# Patient Record
Sex: Female | Born: 1955
Health system: Southern US, Community
[De-identification: ages and names within clinical notes are randomized; demographics above are authoritative.]

## PROBLEM LIST (undated history)

## (undated) DIAGNOSIS — K7460 Unspecified cirrhosis of liver: Secondary | ICD-10-CM

## (undated) DIAGNOSIS — I10 Essential (primary) hypertension: Secondary | ICD-10-CM

## (undated) DIAGNOSIS — K754 Autoimmune hepatitis: Secondary | ICD-10-CM

## (undated) DIAGNOSIS — I82409 Acute embolism and thrombosis of unspecified deep veins of unspecified lower extremity: Secondary | ICD-10-CM

## (undated) DIAGNOSIS — K8020 Calculus of gallbladder without cholecystitis without obstruction: Secondary | ICD-10-CM

## (undated) DIAGNOSIS — R7989 Other specified abnormal findings of blood chemistry: Secondary | ICD-10-CM

## (undated) DIAGNOSIS — S329XXA Fracture of unspecified parts of lumbosacral spine and pelvis, initial encounter for closed fracture: Secondary | ICD-10-CM

## (undated) DIAGNOSIS — D132 Benign neoplasm of duodenum: Secondary | ICD-10-CM

## (undated) DIAGNOSIS — Z889 Allergy status to unspecified drugs, medicaments and biological substances status: Secondary | ICD-10-CM

## (undated) DIAGNOSIS — K449 Diaphragmatic hernia without obstruction or gangrene: Secondary | ICD-10-CM

## (undated) HISTORY — DX: Fracture of unspecified parts of lumbosacral spine and pelvis, initial encounter for closed fracture: S32.9XXA

## (undated) HISTORY — DX: Unspecified cirrhosis of liver: K74.60

## (undated) HISTORY — DX: Other specified abnormal findings of blood chemistry: R79.89

## (undated) HISTORY — DX: Calculus of gallbladder without cholecystitis without obstruction: K80.20

## (undated) HISTORY — DX: Diaphragmatic hernia without obstruction or gangrene: K44.9

## (undated) HISTORY — DX: Autoimmune hepatitis: K75.4

## (undated) HISTORY — PX: ABDOMINAL HYSTERECTOMY: SHX81

---

## 2001-09-12 ENCOUNTER — Emergency Department (HOSPITAL_COMMUNITY): Admission: EM | Admit: 2001-09-12 | Discharge: 2001-09-12 | Payer: Self-pay | Admitting: Emergency Medicine

## 2001-11-10 ENCOUNTER — Emergency Department (HOSPITAL_COMMUNITY): Admission: EM | Admit: 2001-11-10 | Discharge: 2001-11-10 | Payer: Self-pay | Admitting: Emergency Medicine

## 2001-12-20 ENCOUNTER — Emergency Department (HOSPITAL_COMMUNITY): Admission: EM | Admit: 2001-12-20 | Discharge: 2001-12-20 | Payer: Self-pay | Admitting: Emergency Medicine

## 2010-01-30 LAB — HM PAP SMEAR: HM Pap smear: NORMAL

## 2010-07-31 ENCOUNTER — Ambulatory Visit (INDEPENDENT_AMBULATORY_CARE_PROVIDER_SITE_OTHER): Payer: 59 | Admitting: Internal Medicine

## 2010-07-31 ENCOUNTER — Encounter: Payer: Self-pay | Admitting: Internal Medicine

## 2010-07-31 DIAGNOSIS — F17200 Nicotine dependence, unspecified, uncomplicated: Secondary | ICD-10-CM

## 2010-07-31 DIAGNOSIS — Z23 Encounter for immunization: Secondary | ICD-10-CM

## 2010-07-31 DIAGNOSIS — R9431 Abnormal electrocardiogram [ECG] [EKG]: Secondary | ICD-10-CM

## 2010-07-31 DIAGNOSIS — I10 Essential (primary) hypertension: Secondary | ICD-10-CM

## 2010-08-01 ENCOUNTER — Other Ambulatory Visit: Payer: Self-pay | Admitting: Internal Medicine

## 2010-08-01 ENCOUNTER — Encounter (INDEPENDENT_AMBULATORY_CARE_PROVIDER_SITE_OTHER): Payer: Self-pay | Admitting: *Deleted

## 2010-08-01 DIAGNOSIS — Z1231 Encounter for screening mammogram for malignant neoplasm of breast: Secondary | ICD-10-CM

## 2010-08-06 NOTE — Assessment & Plan Note (Signed)
Summary: NEW [employer] PT--#---PKG---STC   Vital Signs:  Patient profile:   55 year old female Menstrual status:  hysterectomy Height:      66.25 inches (168.28 cm) Weight:      231.25 pounds (105.11 kg) BMI:     37.18 O2 Sat:      99 % on Room air Temp:     98.3 degrees F (36.83 degrees C) oral Pulse rate:   81 / minute Pulse rhythm:   regular Resp:     16 per minute BP sitting:   160 / 96  (left arm) Cuff size:   large  Vitals Entered By: Mackey Birchwood CMA(AAMA) (July 31, 2010 4:23 PM)  Nutrition Counseling: Patient's BMI is greater than 25 and therefore counseled on weight management options.  O2 Flow:  Room air CC: Pt sent by [facility] for High Blood Pressure Evaluation & Management/sls, cma, Preventive Care Is Patient Diabetic? No Pain Assessment Patient in pain? no      Comments Left Arm: 160/96 Right Arm: 162/88 Pt has never used Rx for hypertension/sls,cma     Menstrual Status hysterectomy   Primary Care Provider:  Ronnald Ramp  CC:  Pt sent by [facility] for High Blood Pressure Evaluation & Management/sls, cma, and Preventive Care.  History of Present Illness:  Hypertension Follow-Up      This is a 55 year old woman who presents for Hypertension follow-up.  The patient denies lightheadedness, urinary frequency, headaches, edema, rash, and fatigue.  The patient denies the following associated symptoms: chest pain, chest pressure, exercise intolerance, dyspnea, palpitations, syncope, leg edema, and pedal edema.  The patient reports that dietary compliance has been poor.  The patient reports no exercise.  Adjunctive measures currently used by the patient include salt restriction and relaxation.  She tells me that she had extensive labs done by her employer and that her labs were normal.  Preventive Screening-Counseling & Management  Alcohol-Tobacco     Alcohol drinks/day: 4     Alcohol type: beer     >5/day in last 3 mos: no     Alcohol Counseling: to decrease amount  and/or frequency of alcohol intake     Feels need to cut down: no     Feels annoyed by complaints: no     Feels guilty re: drinking: no     Needs 'eye opener' in am: no     Smoking Status: current     Smoking Cessation Counseling: yes     Smoke Cessation Stage: precontemplative     Packs/Day: <0.25     Year Started: 1980     Pack years: 5     Tobacco Counseling: to quit use of tobacco products  Caffeine-Diet-Exercise     Does Patient Exercise: no  Hep-HIV-STD-Contraception     Hepatitis Risk: no risk noted     HIV Risk: no risk noted     STD Risk: no risk noted      Sexual History:  currently monogamous.        Drug Use:  no.        Blood Transfusions:  no.    Medications Prior to Update: 1)  None  Current Medications (verified): 1)  Claritin 10 Mg Tabs (Loratadine) .... Take 1 Once Daily As Needed 2)  Ibuprofen .... Take As Needed 3)  Aspirin .... Take As Needed 4)  Azor 10-40 Mg Tabs (Amlodipine-Olmesartan) .... One By Mouth Once Daily For High Blood Pressure  Allergies (verified): No Known Drug Allergies  Past History:  Past Medical History: Hypertension  Past Surgical History: Hysterectomy  Family History: Family History of Alcoholism/Addiction Family History Hypertension Family History Thyroid disease  Social History: Occupation: Development worker, international aid at [employer] Single Current Smoker Alcohol use-yes Drug use-no Regular exercise-no Smoking Status:  current Packs/Day:  <0.25 Hepatitis Risk:  no risk noted HIV Risk:  no risk noted STD Risk:  no risk noted Sexual History:  currently monogamous Blood Transfusions:  no Drug Use:  no Does Patient Exercise:  no  Review of Systems       The patient complains of weight gain.  The patient denies anorexia, fever, weight loss, chest pain, syncope, dyspnea on exertion, peripheral edema, prolonged cough, headaches, hemoptysis, abdominal pain, hematuria, suspicious skin lesions, difficulty walking, depression,  and angioedema.    Physical Exam  General:  alert, well-developed, well-nourished, well-hydrated, appropriate dress, normal appearance, healthy-appearing, cooperative to examination, good hygiene, and overweight-appearing.   Head:  normocephalic, atraumatic, no abnormalities observed, and no abnormalities palpated.   Eyes:  vision grossly intact, pupils equal, pupils round, and pupils reactive to light.   Ears:  R ear normal and L ear normal.   Mouth:  Oral mucosa and oropharynx without lesions or exudates.  Teeth in good repair. Neck:  supple, full ROM, no masses, no thyromegaly, no thyroid nodules or tenderness, no JVD, no HJR, normal carotid upstroke, no carotid bruits, and no cervical lymphadenopathy.   Lungs:  normal respiratory effort, no intercostal retractions, no accessory muscle use, normal breath sounds, no dullness, no fremitus, no crackles, and no wheezes.   Heart:  normal rate, regular rhythm, no murmur, no gallop, no rub, and no JVD.   Abdomen:  soft, non-tender, normal bowel sounds, no distention, no masses, no guarding, no rigidity, no rebound tenderness, no abdominal hernia, no inguinal hernia, no hepatomegaly, and no splenomegaly.   Msk:  No deformity or scoliosis noted of thoracic or lumbar spine.   Pulses:  R and L carotid,radial,femoral,dorsalis pedis and posterior tibial pulses are full and equal bilaterally Extremities:  No clubbing, cyanosis, edema, or deformity noted with normal full range of motion of all joints.   Neurologic:  No cranial nerve deficits noted. Station and gait are normal. Plantar reflexes are down-going bilaterally. DTRs are symmetrical throughout. Sensory, motor and coordinative functions appear intact. Skin:  Intact without suspicious lesions or rashes Cervical Nodes:  No lymphadenopathy noted Psych:  Cognition and judgment appear intact. Alert and cooperative with normal attention span and concentration. No apparent delusions, illusions,  hallucinations Additional Exam:  EKG shows poor RWP in anterior leads and possible Q waves.   Impression & Recommendations:  Problem # 1:  HYPERTENSION (ICD-401.9) Assessment New  Her updated medication list for this problem includes:    Azor 10-40 Mg Tabs (Amlodipine-olmesartan) ..... One by mouth once daily for high blood pressure  BP today: 160/96  Orders: EKG w/ Interpretation (93000)  Problem # 2:  TOBACCO USE (ICD-305.1) Assessment: New  Encouraged smoking cessation and discussed different methods for smoking cessation.   Orders: EKG w/ Interpretation (93000)  Problem # 3:  ABNORMAL ELECTROCARDIOGRAM (ICD-794.31) Assessment: New  Orders: Cardiolite (Cardiolite) EKG w/ Interpretation (93000)  Complete Medication List: 1)  Claritin 10 Mg Tabs (Loratadine) .... Take 1 once daily as needed 2)  Ibuprofen  .... Take as needed 3)  Aspirin  .... Take as needed 4)  Azor 10-40 Mg Tabs (Amlodipine-olmesartan) .... One by mouth once daily for high blood pressure  Other Orders: Radiology Referral (  Radiology) Tdap => 66yr IM ((08144 Admin 1st Vaccine ([phone]  Colorectal Screening:  Current Recommendations:    Colonoscopy recommended: patient defers today but will consider in the future  PAP Screening:    Reviewed PAP smear recommendations:  patient refuses understanding risks of delayed diagnosis  Mammogram Screening:    Reviewed Mammogram recommendations:  mammogram ordered  Osteoporosis Risk Assessment:  Risk Factors for Fracture or Low Bone Density:   Smoking status:       current  Immunization & Chemoprophylaxis:    Tetanus vaccine: Tdap  (07/31/2010)  Patient Instructions: 1)  Please schedule a follow-up appointment in 1 month. 2)  Tobacco is very bad for your health and your loved ones! You Should stop smoking!. 3)  Stop Smoking Tips: Choose a Quit date. Cut down before the Quit date. decide what you will do as a substitute when you feel the urge to  smoke(gum,toothpick,exercise). 4)  It is important that you exercise regularly at least 20 minutes 5 times a week. If you develop chest pain, have severe difficulty breathing, or feel very tired , stop exercising immediately and seek medical attention. 5)  You need to lose weight. Consider a lower calorie diet and regular exercise.  6)  It is not healthy  for men to drink more than 2-3 drinks per day or for women to drink more than 1-2 drinks per day. 7)  Schedule your mammogram. 8)  Schedule a colonoscopy/sigmoidoscopy to help detect colon cancer. 9)  You need to have a Pap Smear to prevent cervical cancer. 10)  Take an Aspirin every day. 11)  Check your Blood Pressure regularly. If it is above 140/90: you should make an appointment. Prescriptions: AZOR 10-40 MG TABS (AMLODIPINE-OLMESARTAN) One by mouth once daily for high blood pressure  #140 x 0   Entered and Authorized by:   TJanith LimaMD   Signed by:   TJanith LimaMD on 07/31/2010   Method used:   Samples Given   RxID:   [id number]   Orders Added: 1)  Radiology Referral [Radiology] 2)  Cardiolite [Cardiolite] 3)  Tdap => 79yrIM [9[27741])  Admin 1st Vaccine [9[28786] 7 New Patient Level IV [9[67209])  EKG w/ Interpretation [93000]   Immunizations Administered:  Tetanus Vaccine:    Vaccine Type: Tdap    Site: right deltoid    Mfr: GlaxoSmithKline    Dose: 0.5 ml    Route: IM    Given by: LaEstell HarpinMA    Exp. Date: 03/28/2012    Lot #: [id number]  VIS given: 04/26/08 version given July 31, 2010.   Immunizations Administered:  Tetanus Vaccine:    Vaccine Type: Tdap    Site: right deltoid    Mfr: GlaxoSmithKline    Dose: 0.5 ml    Route: IM    Given by: LaEstell HarpinMA    Exp. Date: 03/28/2012    Lot #: [id number]  VIS given: 04/26/08 version given July 31, 2010.

## 2010-08-06 NOTE — Letter (Signed)
Summary: Primary Care Consult Scheduled Letter  [facility]   [place], [place] [address]   Phone: [phone]  Fax: [phone]      08/01/2010 MRN: [id number]  [facility] [address] [place], [place]  [address]  [place]    Dear Ms. Milleson,      We have scheduled an appointment for you.  At the recommendation of Dr.Thomas Jones, we have scheduled you a MRI Rande Lawman) on March 6,2012 at 9:45 AM .  Their address is [address] .The office phone number is [phone] If this appointment day and time is not convenient for you, please feel free to call the office of the doctor you are being referred to at the number listed above and reschedule the appointment.     It is important for you to keep your scheduled appointments. We are here to make sure you are given good patient care. If you have questions or you have made changes to your appointment, please notify us at  [phone]        , ask for  DEBRA            .   - Thank you,  Patient Care Coordinator [facility]

## 2010-08-12 ENCOUNTER — Telehealth (INDEPENDENT_AMBULATORY_CARE_PROVIDER_SITE_OTHER): Payer: Self-pay | Admitting: Radiology

## 2010-08-13 ENCOUNTER — Encounter (HOSPITAL_COMMUNITY): Payer: 59

## 2010-08-20 NOTE — Progress Notes (Signed)
Summary: Nuclear Pre-Procedure  Phone Note Outgoing Call Call back at Home Phone [phone]   Call placed by: Eliezer Lofts, EMT-P,  August 12, 2010 3:17 PM Call placed to: Patient Reason for Call: Confirm/change Appt Summary of Call: Unable to leave message with information on My[employer] Information Sheet (see scanned document for details). No answer/no machine. Eliezer Lofts, EMT-P  August 12, 2010 3:18 PM      Nuclear Med Background Indications for Stress Test: Evaluation for Ischemia, Abnormal EKG  Indications Comments: new changes Q-Waves       Nuclear Pre-Procedure Cardiac Risk Factors: Hypertension, Smoker Height (in): 66.25

## 2010-08-28 ENCOUNTER — Encounter (HOSPITAL_COMMUNITY): Payer: 59

## 2010-09-04 ENCOUNTER — Encounter (HOSPITAL_COMMUNITY): Payer: 59 | Admitting: Radiology

## 2010-09-12 ENCOUNTER — Ambulatory Visit (HOSPITAL_COMMUNITY): Payer: 59 | Attending: Internal Medicine | Admitting: Radiology

## 2010-09-12 VITALS — Ht 66.0 in | Wt 222.0 lb

## 2010-09-12 DIAGNOSIS — R0602 Shortness of breath: Secondary | ICD-10-CM

## 2010-09-12 DIAGNOSIS — R9431 Abnormal electrocardiogram [ECG] [EKG]: Secondary | ICD-10-CM | POA: Insufficient documentation

## 2010-09-12 MED ORDER — TECHNETIUM TC 99M TETROFOSMIN IV KIT
11.0000 | PACK | Freq: Once | INTRAVENOUS | Status: AC | PRN
  Administered 2010-09-12: 11 via INTRAVENOUS

## 2010-09-12 MED ORDER — REGADENOSON 0.4 MG/5ML IV SOLN
0.4000 mg | Freq: Once | INTRAVENOUS | Status: AC
  Administered 2010-09-12: 0.4 mg via INTRAVENOUS

## 2010-09-12 MED ORDER — TECHNETIUM TC 99M TETROFOSMIN IV KIT
33.0000 | PACK | Freq: Once | INTRAVENOUS | Status: AC | PRN
  Administered 2010-09-12: 33 via INTRAVENOUS

## 2010-09-12 NOTE — Progress Notes (Signed)
[facility] [place] [address] [phone]  Cardiology Nuclear Med Study  Kimberly Pierce is a 55 y.o. female [id number] December 12, 1955   Nuclear Med Background Indication for Stress Test:  Evaluation for Ischemia and Abnormal EKG History:  No prior known history of CAD Cardiac Risk Factors: Family History - CAD, Hypertension and Smoker  Symptoms:  No symptoms   Nuclear Pre-Procedure Caffeine/Decaff Intake:  None NPO After: 7:00pm   Lungs:  Clear IV 0.9% NS with Angio Cath:  20g  IV Site: R Forearm  IV Started by:  Eliezer Lofts, EMT-P  Chest Size (in):  44 Cup Size: D  Height: 5' 6"  (1.676 m)  Weight:  222 lb (100.699 kg)  BMI:  Body mass index is 35.83 kg/(m^2). Tech Comments:  NA    Nuclear Med Study 1 or 2 day study: 1 day  Stress Test Type:  Lexiscan  Reading MD: Jenkins Rouge, MD  Order Authorizing Provider:  Dr. [name], MD  Resting Radionuclide: Technetium 16mTetrofosmin  Resting Radionuclide Dose: 11 mCi   Stress Radionuclide:  Technetium 918metrofosmin  Stress Radionuclide Dose: 33 mCi           Stress Protocol Rest HR: 90 Stress HR: 116  Rest BP: 159/99 Stress BP: 182/81  Exercise Time (min): n/a METS: n/a   Predicted Max HR: 165 bpm % Max HR: 70.3 bpm Rate Pressure Product: 21112   Dose of Adenosine (mg):  n/a Dose of Lexiscan: 0.4 mg  Dose of Atropine (mg): n/a Dose of Dobutamine: n/a mcg/kg/min (at max HR)  Stress Test Technologist: TeIleene HutchinsonEMT-P ; data entered by PaAllen Derryuclear Technologist:   ToAnnye Rusk  Rest Procedure:  Myocardial perfusion imaging was performed at rest 45 minutes following the intravenous administration of Technetium 9949mtrofosmin. Rest ECG: NSR  Stress Procedure: The patient attempted walking treadmill using Bruce Protocol, but the patient unable to comprehend proper technique of walking, changed to lexUpper Mont[place] patient received IV Lexiscan 0.4 mg  over 15-seconds.  Technetium 45m68mrofosmin injected at 30-seconds.  There were no significant changes with Lexiscan, occasional PVC's.  Quantitative spect images were obtained after a 45 minute delay.  Stress ECG: No significant change from baseline ECG  QPS Raw Data Images:  Normal; no motion artifact; normal heart/lung ratio. Stress Images:  Normal homogeneous uptake in all areas of the myocardium. Rest Images:  Normal homogeneous uptake in all areas of the myocardium. Subtraction (SDS):  Normal Transient Ischemic Dilatation (Normal <1.22): .90 Lung/Heart Ratio (Normal <0.45):  .33  Quantitative Gated Spect Images QGS EDV:  118 ml QGS ESV:  94 ml QGS cine images:  NL LV Function; NL Wall Motion QGS EF: 63%  Impression Exercise Capacity:  Lexiscan with no exercise. BP Response:  Normal blood pressure response. Clinical Symptoms:  There is dyspnea. ECG Impression:  No significant ST segment change suggestive of ischemia. Comparison with Prior Nuclear Study: No previous nuclear study performed  Overall Impression:  Normal stress nuclear study.        PeteJenkins Rouge

## 2010-09-16 NOTE — Progress Notes (Signed)
ROUTED TO DR.JONES

## 2011-02-21 ENCOUNTER — Other Ambulatory Visit: Payer: Self-pay

## 2011-02-21 MED ORDER — AMLODIPINE-OLMESARTAN 10-40 MG PO TABS: 1.0000 | ORAL_TABLET | Freq: Every day | ORAL | Status: DC

## 2011-05-02 ENCOUNTER — Emergency Department (HOSPITAL_COMMUNITY)
Admission: EM | Admit: 2011-05-02 | Discharge: 2011-05-03 | Disposition: A | Discharge status: Home / Self Care | Payer: 59 | Attending: Emergency Medicine | Admitting: Emergency Medicine

## 2011-05-02 DIAGNOSIS — Z86718 Personal history of other venous thrombosis and embolism: Secondary | ICD-10-CM | POA: Insufficient documentation

## 2011-05-02 DIAGNOSIS — M545 Low back pain, unspecified: Secondary | ICD-10-CM | POA: Insufficient documentation

## 2011-05-02 DIAGNOSIS — R109 Unspecified abdominal pain: Secondary | ICD-10-CM | POA: Insufficient documentation

## 2011-05-02 DIAGNOSIS — I10 Essential (primary) hypertension: Secondary | ICD-10-CM | POA: Insufficient documentation

## 2011-05-02 DIAGNOSIS — IMO0002 Reserved for concepts with insufficient information to code with codable children: Secondary | ICD-10-CM

## 2011-05-02 DIAGNOSIS — M25559 Pain in unspecified hip: Secondary | ICD-10-CM | POA: Insufficient documentation

## 2011-05-02 DIAGNOSIS — S22009A Unspecified fracture of unspecified thoracic vertebra, initial encounter for closed fracture: Secondary | ICD-10-CM | POA: Insufficient documentation

## 2011-05-02 DIAGNOSIS — Z79899 Other long term (current) drug therapy: Secondary | ICD-10-CM | POA: Insufficient documentation

## 2011-05-02 DIAGNOSIS — X503XXA Overexertion from repetitive movements, initial encounter: Secondary | ICD-10-CM | POA: Insufficient documentation

## 2011-05-02 HISTORY — DX: Acute embolism and thrombosis of unspecified deep veins of unspecified lower extremity: I82.409

## 2011-05-02 HISTORY — DX: Essential (primary) hypertension: I10

## 2011-05-03 ENCOUNTER — Encounter: Payer: Self-pay | Admitting: *Deleted

## 2011-05-03 ENCOUNTER — Emergency Department (HOSPITAL_COMMUNITY): Payer: 59

## 2011-05-03 LAB — POCT I-STAT, CHEM 8
Chloride: 109 mEq/L (ref 96–112)
Creatinine, Ser: 0.3 mg/dL — ABNORMAL LOW (ref 0.50–1.10)
Glucose, Bld: 124 mg/dL — ABNORMAL HIGH (ref 70–99)
HCT: 40 % (ref 36.0–46.0)
Potassium: 3.8 mEq/L (ref 3.5–5.1)
Sodium: 141 mEq/L (ref 135–145)

## 2011-05-03 MED ORDER — IBUPROFEN 800 MG PO TABS: 800.0000 mg | ORAL_TABLET | Freq: Three times a day (TID) | ORAL | Status: AC

## 2011-05-03 MED ORDER — HYDROCODONE-ACETAMINOPHEN 5-500 MG PO TABS: 1.0000 | ORAL_TABLET | Freq: Three times a day (TID) | ORAL | Status: DC | PRN

## 2011-05-03 MED ORDER — KETOROLAC TROMETHAMINE 60 MG/2ML IM SOLN
60.0000 mg | Freq: Once | INTRAMUSCULAR | Status: AC
  Administered 2011-05-03: 60 mg via INTRAMUSCULAR
  Filled 2011-05-03: qty 2

## 2011-05-03 NOTE — ED Notes (Signed)
C/o intermittant back spasms, r/t lifting furniture in [place], comes and goes, worse at night, muscles jumping in my back. Denies other sx. No relief with ibuprofen or aleve.

## 2011-05-03 NOTE — ED Provider Notes (Signed)
History     CSN: [id number] Arrival date & time: 05/02/2011 11:23 PM   First MD Initiated Contact with Patient 05/03/11 0159      Chief Complaint  Patient presents with  . Back Pain    since lifting furniture in OCT, n"feel like spasms".    (Consider location/radiation/quality/duration/timing/severity/associated sxs/prior treatment) Patient is a 55 y.o. female presenting with back pain. The history is provided by the patient.  Back Pain  Episode onset: About 3 weeks ago. The problem occurs daily. The problem has not changed since onset.Associated with: Kimberly Pierce is a started after she was lifting some heavy furniture. The pain is present in the lumbar spine. The quality of the pain is described as shooting. Radiates to: Left hip and flank. The pain is moderate. The symptoms are aggravated by bending and twisting. The pain is worse during the night. Pertinent negatives include no chest pain, no fever, no weight loss, no headaches, no abdominal pain, no abdominal swelling, no bowel incontinence, no perianal numbness, no bladder incontinence, no dysuria, no pelvic pain, no leg pain, no paresthesias, no paresis, no tingling and no weakness. She has tried NSAIDs for the symptoms. The treatment provided mild relief. Risk factors include obesity and lack of exercise.   has been trying over-the-counter medications with minimal relief. Pain is sharp in nature. She notices it mostly at nighttime when she is rolling over. No significant alleviating factors. No incontinence, numbness or tingling or weakness in her lower extremities. No history of back problems in the past. No history of kidney stones. No hematuria.  Past Medical History  Diagnosis Date  . Hypertension   . DVT (deep venous thrombosis)     Past Surgical History  Procedure Date  . Abdominal hysterectomy     Family History  Problem Relation Age of Onset  . Heart failure Father     History  Substance Use Topics  . Smoking status:  Current Some Day Smoker  . Smokeless tobacco: Not on file  . Alcohol Use: Yes     occaisional    OB History    Grav Para Term Preterm Abortions TAB SAB Ect Mult Living                  Review of Systems  Constitutional: Negative for fever, chills and weight loss.  HENT: Negative for neck pain and neck stiffness.   Eyes: Negative for pain.  Respiratory: Negative for shortness of breath.   Cardiovascular: Negative for chest pain.  Gastrointestinal: Negative for vomiting, abdominal pain and bowel incontinence.  Genitourinary: Negative for bladder incontinence, dysuria and pelvic pain.  Musculoskeletal: Positive for back pain. Negative for joint swelling and gait problem.  Skin: Negative for rash.  Neurological: Negative for tingling, weakness, headaches and paresthesias.  All other systems reviewed and are negative.    Allergies  Review of patient's allergies indicates no known allergies.  Home Medications   Current Outpatient Rx  Name Route Sig Dispense Refill  . AMLODIPINE-OLMESARTAN 10-40 MG PO TABS Oral Take 1 tablet by mouth daily.      Marland Kitchen LORATADINE 10 MG PO TABS Oral Take 10 mg by mouth daily as needed.      Marland Kitchen NAPROXEN SODIUM 220 MG PO TABS Oral Take 660 mg by mouth daily as needed. For back pain       BP 218/97  Pulse 87  Temp(Src) 98.3 F (36.8 C) (Oral)  Resp 19  SpO2 97%  Physical Exam  Constitutional: She is  oriented to person, place, and time. She appears well-developed and well-nourished.  HENT:  Head: Normocephalic and atraumatic.  Eyes: Conjunctivae and EOM are normal. Pupils are equal, round, and reactive to light.  Neck: Full passive range of motion without pain. Neck supple. No thyromegaly present.       No midline cervical, thoracic or lumbar tenderness. Localizes discomfort to the left paralumbar and flank area. No reproducible tenderness. No deformity. No rash or lesion. Lower extremity strengths, sensorium to light touch, DTRs are equal and  intact  Cardiovascular: Normal rate, regular rhythm, S1 normal, S2 normal and intact distal pulses.   Pulmonary/Chest: Effort normal and breath sounds normal.  Abdominal: Soft. Bowel sounds are normal. There is no tenderness. There is no CVA tenderness.  Musculoskeletal: Normal range of motion.  Neurological: She is alert and oriented to person, place, and time. She has normal strength and normal reflexes. No cranial nerve deficit or sensory deficit. She displays a negative Romberg sign. GCS eye subscore is 4. GCS verbal subscore is 5. GCS motor subscore is 6.       Normal Gait  Skin: Skin is warm and dry. No rash noted. No cyanosis. Nails show no clubbing.  Psychiatric: She has a normal mood and affect. Her speech is normal and behavior is normal.    ED Course  Procedures (including critical care time)  Labs Reviewed  POCT I-STAT, CHEM 8 - Abnormal; Notable for the following:    Creatinine, Ser 0.30 (*)    Glucose, Bld 124 (*)    All other components within normal limits  URINALYSIS, ROUTINE W REFLEX MICROSCOPIC  I-STAT, CHEM 8   Ct Abdomen Pelvis Wo Contrast  05/03/2011  *RADIOLOGY REPORT*  Clinical Data: Intermittent back spasms and left flank pain.  CT ABDOMEN AND PELVIS WITHOUT CONTRAST  Technique:  Multidetector CT imaging of the abdomen and pelvis was performed following the standard protocol without intravenous contrast.  Comparison: None.  Findings: The visualized lung bases are clear.  The liver and spleen are unremarkable in appearance.  The gallbladder is within normal limits.  The pancreas and adrenal glands are unremarkable.  Nonspecific perinephric stranding is noted bilaterally.  There is mild scarring involving the anterior aspect of the right kidney. The kidneys are otherwise grossly unremarkable in appearance. There is no evidence of hydronephrosis.  No renal or ureteral stones are seen.  No Pierce fluid is identified.  The small bowel is unremarkable in appearance.  The  stomach is within normal limits.  No acute vascular abnormalities are seen.  Scattered calcification is noted along the distal abdominal aorta and its branches.  The appendix is normal in caliber and filled with air, extending into the right hemipelvis, without evidence for appendicitis.  The colon is partially filled with stool and is unremarkable in appearance.  The bladder is mildly distended and within normal limits.  The patient is status post hysterectomy; no suspicious adnexal masses are seen.  No inguinal lymphadenopathy is seen.  No acute osseous abnormalities are identified.  There are mild chronic compression deformities involving the superior endplate of Z61, superior endplate of L2, and inferior endplate of L3, likely reflecting underlying Schmorl's nodes.  Associated sclerotic change is seen.  Mild facet disease is noted at the lower lumbar spine.  IMPRESSION:  1.  No acute abnormalities identified within the abdomen or pelvis. 2.  Scattered calcification along the abdominal aorta and its branches. 3.  Mild chronic compression deformities involving the superior endplate of W96,  superior endplate of L2 and inferior endplate of L3, likely reflecting underlying Schmorl's nodes.  Associated sclerotic change noted.  No definite acute fractures seen.  Original Report Authenticated By: Santa Lighter, M.D.   Blood pressure noted and on further history patient has not been taking her blood pressure medications because she was worried that over-the-counter medications with interactive with them. I gave patient her prescribed dose of azor which improved her blood pressure.  IM Toradol for back pain   MDM   CT scan as above for back pain demonstrates compression deformities, likely the source of her symptoms. Plan NSAIDs for pain and narcotics at nighttime as needed. Plan close primary care followup for further evaluation        Teressa Lower, MD 05/03/11 6394

## 2011-05-03 NOTE — ED Notes (Signed)
REPORT GIVEN AND CARE ENDORSED TO CHRISTA, RN

## 2011-05-05 ENCOUNTER — Other Ambulatory Visit (INDEPENDENT_AMBULATORY_CARE_PROVIDER_SITE_OTHER): Payer: 59

## 2011-05-05 ENCOUNTER — Ambulatory Visit (INDEPENDENT_AMBULATORY_CARE_PROVIDER_SITE_OTHER): Payer: 59 | Admitting: Internal Medicine

## 2011-05-05 ENCOUNTER — Encounter: Payer: Self-pay | Admitting: Internal Medicine

## 2011-05-05 VITALS — BP 138/84 | HR 70 | Temp 97.7°F | Resp 16 | Wt 232.0 lb

## 2011-05-05 DIAGNOSIS — I10 Essential (primary) hypertension: Secondary | ICD-10-CM

## 2011-05-05 DIAGNOSIS — S32009A Unspecified fracture of unspecified lumbar vertebra, initial encounter for closed fracture: Secondary | ICD-10-CM

## 2011-05-05 DIAGNOSIS — S32000A Wedge compression fracture of unspecified lumbar vertebra, initial encounter for closed fracture: Secondary | ICD-10-CM

## 2011-05-05 DIAGNOSIS — M810 Age-related osteoporosis without current pathological fracture: Secondary | ICD-10-CM

## 2011-05-05 DIAGNOSIS — M549 Dorsalgia, unspecified: Secondary | ICD-10-CM | POA: Insufficient documentation

## 2011-05-05 LAB — TSH: TSH: 0.99 u[IU]/mL (ref 0.35–5.50)

## 2011-05-05 LAB — COMPREHENSIVE METABOLIC PANEL
Alkaline Phosphatase: 63 U/L (ref 39–117)
BUN: 13 mg/dL (ref 6–23)
CO2: 27 mEq/L (ref 19–32)
Creatinine, Ser: 0.7 mg/dL (ref 0.4–1.2)
GFR: 119.01 mL/min (ref >60.00)
Glucose, Bld: 98 mg/dL (ref 70–99)
Sodium: 141 mEq/L (ref 135–145)
Total Bilirubin: 0.7 mg/dL (ref 0.3–1.2)
Total Protein: 7.7 g/dL (ref 6.0–8.3)

## 2011-05-05 LAB — CBC WITH DIFFERENTIAL/PLATELET
Eosinophils Relative: 4.7 % (ref 0.0–5.0)
HCT: 37 % (ref 36.0–46.0)
Hemoglobin: 12.5 g/dL (ref 12.0–15.0)
Lymphs Abs: 1.9 10*3/uL (ref 0.7–4.0)
MCV: 96 fl (ref 78.0–100.0)
Monocytes Absolute: 0.5 10*3/uL (ref 0.1–1.0)
Monocytes Relative: 7.8 % (ref 3.0–12.0)
Neutro Abs: 3.2 10*3/uL (ref 1.4–7.7)
Platelets: 197 10*3/uL (ref 150.0–400.0)
WBC: 5.8 10*3/uL (ref 4.5–10.5)

## 2011-05-05 MED ORDER — AMLODIPINE-OLMESARTAN 10-40 MG PO TABS: 1.0000 | ORAL_TABLET | Freq: Every day | ORAL | Status: DC

## 2011-05-05 MED ORDER — RISEDRONATE SODIUM 35 MG PO TBEC: 1.0000 | DELAYED_RELEASE_TABLET | ORAL | Status: DC

## 2011-05-05 MED ORDER — BUPRENORPHINE 10 MCG/HR TD PTWK: 1.0000 | MEDICATED_PATCH | TRANSDERMAL | Status: DC

## 2011-05-05 NOTE — Progress Notes (Signed)
Subjective:    Patient ID: Kimberly Pierce, female    DOB: 1955/10/02, 55 y.o.   MRN: [id number]  Back Pain This is a recurrent problem. Episode onset: for 2 months. The problem occurs constantly. The problem is unchanged. The pain is present in the lumbar spine. The quality of the pain is described as stabbing. The pain radiates to the left thigh. The pain is at a severity of 7/10. The pain is moderate. The pain is worse during the day. The symptoms are aggravated by bending, standing and twisting. Stiffness is present all day. Pertinent negatives include no abdominal pain, bladder incontinence, bowel incontinence, chest pain, dysuria, fever, headaches, leg pain, numbness, paresis, paresthesias, pelvic pain, perianal numbness, tingling, weakness or weight loss. Risk factors include menopause, obesity and lack of exercise. She has tried NSAIDs and analgesics (she has not had much relief with vicodin) for the symptoms. The treatment provided mild relief.  Hypertension This is a chronic problem. The current episode started more than 1 year ago. The problem is unchanged. The problem is uncontrolled. Pertinent negatives include no anxiety, blurred vision, chest pain, headaches, malaise/fatigue, neck pain, orthopnea, palpitations, peripheral edema, PND, shortness of breath or sweats. Risk factors for coronary artery disease include no known risk factors. Past treatments include angiotensin blockers and calcium channel blockers. The current treatment provides moderate improvement. Compliance problems include psychosocial issues, exercise and diet.       Review of Systems  Constitutional: Negative for fever, chills, weight loss, malaise/fatigue, diaphoresis, activity change, appetite change, fatigue and unexpected weight change.  HENT: Negative for facial swelling and neck pain.   Eyes: Negative.  Negative for blurred vision.  Respiratory: Negative for cough, shortness of breath, wheezing and stridor.     Cardiovascular: Negative for chest pain, palpitations, orthopnea and PND.  Gastrointestinal: Negative for nausea, vomiting, abdominal pain, diarrhea, constipation, blood in stool and bowel incontinence.  Genitourinary: Negative for bladder incontinence, dysuria, urgency, frequency, hematuria, decreased urine volume, enuresis, difficulty urinating, pelvic pain and dyspareunia.  Musculoskeletal: Positive for back pain. Negative for myalgias, joint swelling, arthralgias and gait problem.  Skin: Negative for color change, pallor, rash and wound.  Neurological: Negative for dizziness, tingling, tremors, seizures, syncope, facial asymmetry, speech difficulty, weakness, light-headedness, numbness, headaches and paresthesias.  Hematological: Negative for adenopathy. Does not bruise/bleed easily.  Psychiatric/Behavioral: Negative.        Objective:   Physical Exam  Vitals reviewed. Constitutional: She is oriented to person, place, and time. She appears well-developed and well-nourished. No distress.  HENT:  Head: Normocephalic and atraumatic.  Mouth/Throat: Oropharynx is clear and moist. No oropharyngeal exudate.  Eyes: Conjunctivae are normal. Right eye exhibits no discharge. Left eye exhibits no discharge. No scleral icterus.  Neck: Normal range of motion. Neck supple. No JVD present. No tracheal deviation present. No thyromegaly present.  Cardiovascular: Normal rate, regular rhythm, normal heart sounds and intact distal pulses.  Exam reveals no gallop and no friction rub.   No murmur heard. Pulmonary/Chest: Effort normal and breath sounds normal. No stridor. No respiratory distress. She has no wheezes. She has no rales. She exhibits no tenderness.  Abdominal: Soft. Bowel sounds are normal. She exhibits no distension and no mass. There is no tenderness. There is no rebound and no guarding.  Musculoskeletal: Normal range of motion. She exhibits no edema and no tenderness.       Lumbar back:  Normal. She exhibits normal range of motion, no tenderness, no bony tenderness, no swelling, no edema,  no deformity, no laceration, no pain, no spasm and normal pulse.  Lymphadenopathy:    She has no cervical adenopathy.  Neurological: She is alert and oriented to person, place, and time. She has normal strength. She displays no atrophy, no tremor and normal reflexes. No cranial nerve deficit or sensory deficit. She exhibits normal muscle tone. She displays a negative Romberg sign. She displays no seizure activity. Coordination and gait normal. She displays no Babinski's sign on the right side. She displays no Babinski's sign on the left side.  Reflex Scores:      Tricep reflexes are 1+ on the right side and 1+ on the left side.      Bicep reflexes are 1+ on the right side and 1+ on the left side.      Brachioradialis reflexes are 1+ on the right side and 1+ on the left side.      Patellar reflexes are 1+ on the right side and 1+ on the left side.      Achilles reflexes are 1+ on the right side and 1+ on the left side. Skin: Skin is warm and dry. No rash noted. She is not diaphoretic. No erythema. No pallor.  Psychiatric: She has a normal mood and affect. Her behavior is normal. Judgment and thought content normal.     Lab Results  Component Value Date   HGB 13.6 05/03/2011   HCT 40.0 05/03/2011   GLUCOSE 124* 05/03/2011   NA 141 05/03/2011   K 3.8 05/03/2011   CL 109 05/03/2011   CREATININE 0.30* 05/03/2011   BUN 15 05/03/2011       Assessment & Plan:

## 2011-05-05 NOTE — Progress Notes (Signed)
rx called in

## 2011-05-05 NOTE — Patient Instructions (Addendum)
Osteoporosis Osteoporosis is a disease of the bones that makes them weaker and prone to break (fracture). By their mid-30s, most people begin to gradually lose bone strength. If this is severe enough, osteoporosis may occur. Osteopenia is a less severe weakness of the bones, which places you at risk for osteoporosis. It is important to identify if you have osteoporosis or osteopenia. Bone fractures from osteoporosis (especially hip and spine fractures) are a major cause of hospitalization, loss of independence, and can lead to life-threatening complications. CAUSES  There are a number of causes and risk factors:  Gender. Women are at a higher risk for osteoporosis than men.   Age. Bone formation slows down with age.   Ethnicity. For unclear reasons, white and Asian women are at higher risk for osteoporosis. Hispanic and African American women are at increased, but lesser, risk.   Family history of osteoporosis can mean that you are at a higher risk for getting it.   History of bone fractures indicates you may be at higher risk of another.   Calcium is very important for bone health and strength. Not enough calcium in your diet increases your risk for osteoporosis. Vitamin D is important for calcium metabolism. You get vitamin D from sunlight, foods, or supplements.   Physical activity. Bones get stronger with weight-bearing exercise and weaker without use.   Smoking is associated with decreased bone strength.   Medicines. Cortisone medicines, too much thyroid medicine, some cancer and seizure medicines, and others can weaken bones and cause osteoporosis.   Decreased body weight is associated with osteoporosis. The small amount of estrogen-type molecules produced in fat cells seems to protect the bones.   Menopausal decrease in the hormone estrogen can cause osteoporosis.   Low levels of the hormone testosterone can cause osteoporosis.   Some medical conditions can lead to osteoporosis  (hyperthyroidism, hyperparathyroidism, B12 deficiency).  SYMPTOMS  Usually, no symptoms are felt as the bones weaken. The first symptoms are generally related to bone fractures. You may have silent, tiny bone fractures, especially in your spine. This can cause height loss and forward bending of the spine (kyphosis). DIAGNOSIS  You or your caregiver may suspect osteoporosis based on height loss and kyphosis. Osteoporosis or osteopenia may be identified on an X-ray done for other reasons. A bone density measurement will likely be taken. Your bones are often measured at your lower spine or your hips. Measurement is done by an X-ray called a DEXA scan, or sometimes by a computerized X-ray scan (CT or CAT scan). Other tests may be done to find the cause of osteoporosis, such as blood tests to measure calcium and vitamin D, or to monitor treatment. TREATMENT  The goal of osteoporosis treatment is to prevent fractures. This is done through medicine and home care treatments. Treatment will slow the weakening of your bones and strengthen them where possible. Measures to decrease the likelihood of falling and fracturing a bone are also important. Medicine  You may need supplements if you are not getting enough calcium, vitamin D, and vitamin B12.   If you are female and menopausal, you should discuss the option of estrogen replacement or estrogen-like medicine with your caregiver.   Medicines can be taken by mouth or injection to help build bone strength. When taken by mouth, there are important directions that you need to follow.   Calcitonin is a hormone made by the thyroid gland that can help build bone strength and decrease fracture risk in the spine. It  can be taken by nasal spray or injection.   Parathyroid hormone can be injected to help build bone strength.   You will need to continue to get enough calcium intake with any of these medicines.  FALL PREVENTION  If you are unsteady on your feet, use  a cane, walker, or walk with someone's help.   Remove loose rugs or electrical cords from your home.   Keep your home well lit at night. Use glasses if you need them.   Avoid icy streets and wet or waxed floors.   Hold the railing when using stairs.   Watch out for your pets.   Install grab bars in your bathroom.   Exercise. Physical activity, especially weight-bearing exercise, helps strengthen bones. Strength and balance exercise, such as tai chi, helps prevent falls.   Alcohol and some medicines can make you more likely to fall. Discuss alcohol use with your caregiver. Ask your caregiver if any of your medicines might increase your risk for falling. Ask if safer alternatives are available.  HOME CARE INSTRUCTIONS   Try to prevent and avoid falls.   To pick up objects, bend at the knees. Do not bend with your back.   Do not smoke. If you smoke, ask for help to stop.   Have adequate calcium and vitamin D in your diet. Talk with your caregiver about amounts.   Before exercising, ask your caregiver what exercises will be good for you.   Only take over-the-counter or prescription medicines for pain, discomfort, or fever as directed by your caregiver.  SEEK MEDICAL CARE IF:   You have had a fracture and your pain is not controlled.   You have had a fracture and you are not able to return to activities as expected.   You are reinjured.   You develop side effects from medicines, especially stomach pain or trouble swallowing.   You develop new, unexplained problems.  SEEK IMMEDIATE MEDICAL CARE IF:   You develop sudden, severe pain in your back.   You develop pain after an injury or fall.  Document Released: 03/05/2005 Document Revised: 02/05/2011 Document Reviewed: 01/21/2007 [facility] Patient Information 2012 [place].Back Pain, Adult Low back pain is very common. About 1 in 5 people have back pain.The cause of low back pain is rarely dangerous. The pain often gets  better over time.About half of people with a sudden onset of back pain feel better in just 2 weeks. About 8 in 10 people feel better by 6 weeks.  CAUSES Some common causes of back pain include:  Strain of the muscles or ligaments supporting the spine.   Wear and tear (degeneration) of the spinal discs.   Arthritis.   Direct injury to the back.  DIAGNOSIS Most of the time, the direct cause of low back pain is not known.However, back pain can be treated effectively even when the exact cause of the pain is unknown.Answering your caregiver's questions about your overall health and symptoms is one of the most accurate ways to make sure the cause of your pain is not dangerous. If your caregiver needs more information, he or she may order lab work or imaging tests (X-rays or MRIs).However, even if imaging tests show changes in your back, this usually does not require surgery. HOME CARE INSTRUCTIONS For many people, back pain returns.Since low back pain is rarely dangerous, it is often a condition that people can learn to [facility] their own.   Remain active. It is stressful on the back  to sit or stand in one place. Do not sit, drive, or stand in one place for more than 30 minutes at a time. Take short walks on level surfaces as soon as pain allows.Try to increase the length of time you walk each day.   Do not stay in bed.Resting more than 1 or 2 days can delay your recovery.   Do not avoid exercise or work.Your body is made to move.It is not dangerous to be active, even though your back may hurt.Your back will likely heal faster if you return to being active before your pain is gone.   Pay attention to your body when you bend and lift. Many people have less discomfortwhen lifting if they bend their knees, keep the load close to their bodies,and avoid twisting. Often, the most comfortable positions are those that put less stress on your recovering back.   Find a comfortable position to  sleep. Use a firm mattress and lie on your side with your knees slightly bent. If you lie on your back, put a pillow under your knees.   Only take over-the-counter or prescription medicines as directed by your caregiver. Over-the-counter medicines to reduce pain and inflammation are often the most helpful.Your caregiver may prescribe muscle relaxant drugs.These medicines help dull your pain so you can more quickly return to your normal activities and healthy exercise.   Put ice on the injured area.   Put ice in a plastic bag.   Place a towel between your skin and the bag.   Leave the ice on for 15 to 20 minutes, 3 to 4 times a day for the first 2 to 3 days. After that, ice and heat may be alternated to reduce pain and spasms.   Ask your caregiver about trying back exercises and gentle massage. This may be of some benefit.   Avoid feeling anxious or stressed.Stress increases muscle tension and can worsen back pain.It is important to recognize when you are anxious or stressed and learn ways to manage it.Exercise is a great option.  SEEK MEDICAL CARE IF:  You have pain that is not relieved with rest or medicine.   You have pain that does not improve in 1 week.   You have new symptoms.   You are generally not feeling well.  SEEK IMMEDIATE MEDICAL CARE IF:   You have pain that radiates from your back into your legs.   You develop new bowel or bladder control problems.   You have unusual weakness or numbness in your arms or legs.   You develop nausea or vomiting.   You develop abdominal pain.   You feel faint.  Document Released: 05/26/2005 Document Revised: 02/05/2011 Document Reviewed: 10/14/2010 [facility] Patient Information 2012 [place].

## 2011-05-06 ENCOUNTER — Telehealth: Payer: Self-pay

## 2011-05-06 DIAGNOSIS — M549 Dorsalgia, unspecified: Secondary | ICD-10-CM

## 2011-05-06 DIAGNOSIS — S32000A Wedge compression fracture of unspecified lumbar vertebra, initial encounter for closed fracture: Secondary | ICD-10-CM

## 2011-05-06 LAB — PTH, INTACT AND CALCIUM
Calcium, Total (PTH): 10.2 mg/dL (ref 8.4–10.5)
PTH: 31.8 pg/mL (ref 14.0–72.0)

## 2011-05-06 LAB — SEDIMENTATION RATE: Sed Rate: 13 mm/hr (ref 0–22)

## 2011-05-06 NOTE — Assessment & Plan Note (Signed)
She was recently seen in the ER and a CT of the abd showed end plate compression fractures at T11, L2, and L3. She does not describe any recent trauma o/t some lifting 2 months ago. Show now has radicular pain into the left thigh so I think she should have an MRI done.

## 2011-05-06 NOTE — Assessment & Plan Note (Signed)
I gave her more samples of Azor and I asked her to be more compliant with her meds

## 2011-05-06 NOTE — Assessment & Plan Note (Addendum)
I have asked her to do an MRI to see if the compression fractures have damaged the surrounding structures. I have also offered her an Rx of butrans for pain relief.

## 2011-05-06 NOTE — Telephone Encounter (Signed)
The patch (Buprenorphine) prescribed at the [facility] yesterday 05/05/2011 the pharmacy no longer carries. The patient is requesting alternative (preferrable a pill and no patch) to [facility]. Call back number for this patient is [phone].

## 2011-05-06 NOTE — Assessment & Plan Note (Signed)
The compression fractures indicate to me that she has osteoporosis so I have started her on atelvia and have asked her to do a DEXA scan. Also today I have ordered labs to look for a secondary cause of the osteoporosis

## 2011-05-07 LAB — PROTEIN ELECTROPHORESIS, SERUM
Albumin ELP: 58.5 % (ref 55.8–66.1)
Alpha-1-Globulin: 3.7 % (ref 2.9–4.9)
Alpha-2-Globulin: 10.1 % (ref 7.1–11.8)
Beta 2: 6.2 % (ref 3.2–6.5)
Gamma Globulin: 16.7 % (ref 11.1–18.8)

## 2011-05-07 MED ORDER — OXYCODONE-ACETAMINOPHEN 10-325 MG PO TABS: 1.0000 | ORAL_TABLET | Freq: Four times a day (QID) | ORAL | Status: AC | PRN

## 2011-05-07 NOTE — Telephone Encounter (Signed)
LA - please ask her to come pick up the Rx for percocet

## 2011-05-07 NOTE — Telephone Encounter (Signed)
Patient notified to pick up

## 2011-05-08 ENCOUNTER — Inpatient Hospital Stay (HOSPITAL_COMMUNITY): Admission: RE | Admit: 2011-05-08 | Payer: 59 | Source: Ambulatory Visit

## 2011-05-14 ENCOUNTER — Other Ambulatory Visit (HOSPITAL_COMMUNITY): Payer: 59

## 2011-05-15 ENCOUNTER — Ambulatory Visit (HOSPITAL_COMMUNITY)
Admission: RE | Admit: 2011-05-15 | Discharge: 2011-05-15 | Disposition: A | Discharge status: Home / Self Care | Payer: 59 | Source: Ambulatory Visit | Attending: Internal Medicine | Admitting: Internal Medicine

## 2011-05-15 DIAGNOSIS — M47817 Spondylosis without myelopathy or radiculopathy, lumbosacral region: Secondary | ICD-10-CM | POA: Insufficient documentation

## 2011-05-15 DIAGNOSIS — M545 Low back pain, unspecified: Secondary | ICD-10-CM | POA: Insufficient documentation

## 2011-05-15 DIAGNOSIS — M5146 Schmorl's nodes, lumbar region: Secondary | ICD-10-CM | POA: Insufficient documentation

## 2011-05-15 DIAGNOSIS — Q762 Congenital spondylolisthesis: Secondary | ICD-10-CM | POA: Insufficient documentation

## 2011-10-29 DIAGNOSIS — Z0279 Encounter for issue of other medical certificate: Secondary | ICD-10-CM

## 2012-10-18 ENCOUNTER — Ambulatory Visit: Payer: 59 | Admitting: Internal Medicine

## 2012-10-19 ENCOUNTER — Other Ambulatory Visit: Payer: Self-pay | Admitting: Internal Medicine

## 2012-10-19 DIAGNOSIS — Z1231 Encounter for screening mammogram for malignant neoplasm of breast: Secondary | ICD-10-CM

## 2012-10-20 ENCOUNTER — Telehealth: Payer: Self-pay | Admitting: *Deleted

## 2012-10-20 NOTE — Telephone Encounter (Signed)
Patient left a message requesting a call back but did not leave any information. [employer] - thought she had an appointment next week. Per chart review explained to patient she has an appointment for a mammogram 10/27/12 at 1200 at [facility] mammogram department- explained to her that I was from the Clinic and she does not have an appointment here , but at the [facility] and results will go to her doctor.

## 2012-10-27 ENCOUNTER — Ambulatory Visit (HOSPITAL_COMMUNITY)
Admission: RE | Admit: 2012-10-27 | Discharge: 2012-10-27 | Disposition: A | Discharge status: Home / Self Care | Payer: 59 | Source: Ambulatory Visit | Attending: Internal Medicine | Admitting: Internal Medicine

## 2012-10-27 DIAGNOSIS — Z1231 Encounter for screening mammogram for malignant neoplasm of breast: Secondary | ICD-10-CM | POA: Insufficient documentation

## 2012-11-29 ENCOUNTER — Ambulatory Visit: Payer: 59 | Admitting: Internal Medicine

## 2012-12-08 ENCOUNTER — Ambulatory Visit: Payer: 59 | Admitting: Internal Medicine

## 2012-12-08 DIAGNOSIS — Z0289 Encounter for other administrative examinations: Secondary | ICD-10-CM

## 2012-12-14 ENCOUNTER — Other Ambulatory Visit: Payer: Self-pay | Admitting: Internal Medicine

## 2013-01-10 ENCOUNTER — Ambulatory Visit: Payer: 59 | Admitting: Internal Medicine

## 2013-01-24 ENCOUNTER — Ambulatory Visit: Payer: 59 | Admitting: Internal Medicine

## 2013-03-03 ENCOUNTER — Emergency Department (HOSPITAL_COMMUNITY): Payer: PRIVATE HEALTH INSURANCE

## 2013-03-03 ENCOUNTER — Emergency Department (HOSPITAL_COMMUNITY)
Admission: EM | Admit: 2013-03-03 | Discharge: 2013-03-03 | Disposition: A | Discharge status: Home / Self Care | Payer: PRIVATE HEALTH INSURANCE | Attending: Emergency Medicine | Admitting: Emergency Medicine

## 2013-03-03 ENCOUNTER — Encounter (HOSPITAL_COMMUNITY): Payer: Self-pay | Admitting: Emergency Medicine

## 2013-03-03 DIAGNOSIS — Y9301 Activity, walking, marching and hiking: Secondary | ICD-10-CM | POA: Insufficient documentation

## 2013-03-03 DIAGNOSIS — F17200 Nicotine dependence, unspecified, uncomplicated: Secondary | ICD-10-CM | POA: Insufficient documentation

## 2013-03-03 DIAGNOSIS — S0993XA Unspecified injury of face, initial encounter: Secondary | ICD-10-CM | POA: Insufficient documentation

## 2013-03-03 DIAGNOSIS — Z79899 Other long term (current) drug therapy: Secondary | ICD-10-CM | POA: Insufficient documentation

## 2013-03-03 DIAGNOSIS — S01501A Unspecified open wound of lip, initial encounter: Secondary | ICD-10-CM | POA: Insufficient documentation

## 2013-03-03 DIAGNOSIS — S82009A Unspecified fracture of unspecified patella, initial encounter for closed fracture: Secondary | ICD-10-CM | POA: Insufficient documentation

## 2013-03-03 DIAGNOSIS — Y9289 Other specified places as the place of occurrence of the external cause: Secondary | ICD-10-CM | POA: Insufficient documentation

## 2013-03-03 DIAGNOSIS — I10 Essential (primary) hypertension: Secondary | ICD-10-CM | POA: Insufficient documentation

## 2013-03-03 DIAGNOSIS — S82001A Unspecified fracture of right patella, initial encounter for closed fracture: Secondary | ICD-10-CM

## 2013-03-03 DIAGNOSIS — W19XXXA Unspecified fall, initial encounter: Secondary | ICD-10-CM

## 2013-03-03 DIAGNOSIS — Z86718 Personal history of other venous thrombosis and embolism: Secondary | ICD-10-CM | POA: Insufficient documentation

## 2013-03-03 DIAGNOSIS — W1809XA Striking against other object with subsequent fall, initial encounter: Secondary | ICD-10-CM | POA: Insufficient documentation

## 2013-03-03 MED ORDER — OXYCODONE-ACETAMINOPHEN 5-325 MG PO TABS: 1.0000 | ORAL_TABLET | Freq: Four times a day (QID) | ORAL | Status: DC | PRN

## 2013-03-03 MED ORDER — AMLODIPINE BESYLATE 10 MG PO TABS
10.0000 mg | ORAL_TABLET | Freq: Once | ORAL | Status: AC
  Administered 2013-03-03: 10 mg via ORAL
  Filled 2013-03-03 (×2): qty 1

## 2013-03-03 NOTE — ED Notes (Signed)
Notified PA of variations in L and R arm BP vital signs

## 2013-03-03 NOTE — ED Notes (Signed)
Pt was walking in kitchen when she fell hitting her knees and her face on the ground.  Pt doesn't know why she fell, denies passing out, being dizzy or lightheaded.  Pt has upper lip laceration. And states that her right knee is really painful and appears to be slightly large than left.

## 2013-03-03 NOTE — ED Provider Notes (Signed)
CSN: [id number]     Arrival date & time 03/03/13  0820 History   First MD Initiated Contact with Patient 03/03/13 0827     Chief Complaint  Patient presents with  . Fall  . Lip Laceration  . Knee Pain    right  . Facial Injury   (Consider location/radiation/quality/duration/timing/severity/associated sxs/prior Treatment) HPI Comments: Patient presents today after a fall that occurred just prior to arrival.  She reports that she tripped while walking in the cafeteria.  She hit her right knee and her face when she fell.  She is currently complaining of right knee pain and also has a laceration to her inner upper lip.  Pain in the right knee worse with ambulation and ROM.  She denies any dizziness, lightheadedness, chest pain, or SOB prior to the fall.  Fall was witnessed by coworkers.  No LOC.  She is currently not on any anticoagulants.  She denies back pain, neck pain, headache, nausea, vomiting, numbness, tingling, or vision changes at this time.  No treatment prior to arrival in the ED.  She has been able to ambulate since the fall.  The history is provided by the patient.    Past Medical History  Diagnosis Date  . Hypertension   . DVT (deep venous thrombosis)    Past Surgical History  Procedure Laterality Date  . Abdominal hysterectomy     Family History  Problem Relation Age of Onset  . Heart failure Father   . Alcohol abuse Other   . Drug abuse Other   . Hypertension Other   . Thyroid disease Other    History  Substance Use Topics  . Smoking status: Current Some Day Smoker  . Smokeless tobacco: Not on file  . Alcohol Use: Yes     Comment: occaisional   OB History   Grav Para Term Preterm Abortions TAB SAB Ect Mult Living                 Review of Systems  Musculoskeletal:       Right knee pain  Skin: Positive for wound.  All other systems reviewed and are negative.    Allergies  Review of patient's allergies indicates no known allergies.  Home Medications    Current Outpatient Rx  Name  Route  Sig  Dispense  Refill  . amLODipine-olmesartan (AZOR) 10-40 MG per tablet   Oral   Take 1 tablet by mouth daily.   90 tablet   3   . loratadine (CLARITIN) 10 MG tablet   Oral   Take 10 mg by mouth daily as needed.           . Risedronate Sodium 35 MG TBEC   Oral   Take 1 tablet (35 mg total) by mouth once a week.   4 tablet   11    BP 233/91  Pulse 87  Temp(Src) 97.5 F (36.4 C) (Oral)  Resp 18  SpO2 100% Physical Exam  Nursing note and vitals reviewed. Constitutional: She appears well-developed and well-nourished.  HENT:  Head: Head is without abrasion, without contusion and without laceration.  Nose: No sinus tenderness, nasal deformity or septal deviation. No epistaxis.  Mouth/Throat: Oropharynx is clear and moist and mucous membranes are normal. No trismus in the jaw.  Small superficial laceration of the oral mucosa of the upper lip.  Patient able to open and close mouth without pain  Eyes: EOM are normal. Pupils are equal, round, and reactive to light.  Neck:  Normal range of motion. Neck supple.  Cardiovascular: Normal rate, regular rhythm and normal heart sounds.   Pulmonary/Chest: Effort normal and breath sounds normal.  Musculoskeletal: Normal range of motion.       Right knee: She exhibits no swelling, no effusion, no ecchymosis, no deformity and no erythema. Tenderness found.       Cervical back: She exhibits normal range of motion, no tenderness, no bony tenderness, no swelling, no edema and no deformity.       Thoracic back: She exhibits normal range of motion, no tenderness, no bony tenderness, no swelling, no edema and no deformity.       Lumbar back: She exhibits normal range of motion, no tenderness, no bony tenderness, no swelling, no edema and no deformity.  Full ROM of all extremities without pain aside from the right knee.  Mild tenderness to palpation of the right knee.  Pain with ROM of the knee.  No obvious  edema.  Neurological: She is alert. She has normal strength. No cranial nerve deficit or sensory deficit. Coordination and gait normal.  Skin: Skin is warm and dry.  Psychiatric: She has a normal mood and affect.    ED Course  Procedures (including critical care time) Labs Review Labs Reviewed - No data to display Imaging Review Dg Knee Complete 4 Views Right  03/03/2013   CLINICAL DATA:  Fall, right anterior patellar pain  EXAM: RIGHT KNEE - COMPLETE 4+ VIEW  COMPARISON:  None.  FINDINGS: Four views of the right knee submitted. There is nondisplaced fracture superior medial aspect of the right patella. No joint effusion. No radiopaque foreign body.  IMPRESSION: Nondisplaced fracture superior medial aspect of the right patella.   Electronically Signed   By: Lahoma Crocker   On: 03/03/2013 09:07    MDM  No diagnosis found. Patient presenting after a mechanical fall that occurred just prior to arrival.  Patient with pain of the right knee.  Xray shows nondisplaced fracture of the patella.  Patient given knee immobilizer and crutches.  Patient discharged home with pain medication and Orthopedic follow up.  Patient reports hitting her face.  No obvious signs of trauma aside from a laceration of the oral mucosa of the upper lip.  No trismus.  Patient with a normal neurological exam.  She is currently not on any anticoagulants.  Therefore, do not feel that CT head and CT maxillofacial is indicated at this time.  Patient without spinal tenderness.  Feel that the patient is stable for discharge.  Return precautions given.    Sherlyn Lees [place], PA-C 03/03/13 1013

## 2013-03-03 NOTE — ED Provider Notes (Signed)
Medical screening examination/treatment/procedure(s) were conducted as a shared visit with non-physician practitioner(s) and myself.  I personally evaluated the patient during the encounter  Patient tripped in cafeteria. No head injury or LOC. Hit her R knee - xray with patellar chip fracture. Placed in knee immobilizer.   Osvaldo Shipper, MD 03/03/13 [phone]

## 2013-03-03 NOTE — Progress Notes (Signed)
[facility] provided pt with a list of primary care resources.

## 2013-03-11 ENCOUNTER — Ambulatory Visit: Payer: 59 | Admitting: Internal Medicine

## 2013-03-23 ENCOUNTER — Ambulatory Visit (INDEPENDENT_AMBULATORY_CARE_PROVIDER_SITE_OTHER): Payer: 59 | Admitting: Internal Medicine

## 2013-03-23 ENCOUNTER — Other Ambulatory Visit (INDEPENDENT_AMBULATORY_CARE_PROVIDER_SITE_OTHER): Payer: 59

## 2013-03-23 ENCOUNTER — Encounter: Payer: Self-pay | Admitting: Internal Medicine

## 2013-03-23 VITALS — BP 142/98 | HR 86 | Temp 98.5°F | Resp 16 | Ht 66.0 in | Wt 220.0 lb

## 2013-03-23 DIAGNOSIS — Z23 Encounter for immunization: Secondary | ICD-10-CM

## 2013-03-23 DIAGNOSIS — R7309 Other abnormal glucose: Secondary | ICD-10-CM

## 2013-03-23 DIAGNOSIS — M67449 Ganglion, unspecified hand: Secondary | ICD-10-CM

## 2013-03-23 DIAGNOSIS — E785 Hyperlipidemia, unspecified: Secondary | ICD-10-CM

## 2013-03-23 DIAGNOSIS — I10 Essential (primary) hypertension: Secondary | ICD-10-CM

## 2013-03-23 DIAGNOSIS — R739 Hyperglycemia, unspecified: Secondary | ICD-10-CM | POA: Insufficient documentation

## 2013-03-23 DIAGNOSIS — E559 Vitamin D deficiency, unspecified: Secondary | ICD-10-CM

## 2013-03-23 DIAGNOSIS — M810 Age-related osteoporosis without current pathological fracture: Secondary | ICD-10-CM

## 2013-03-23 DIAGNOSIS — L723 Sebaceous cyst: Secondary | ICD-10-CM

## 2013-03-23 LAB — URINALYSIS, ROUTINE W REFLEX MICROSCOPIC
Nitrite: NEGATIVE
Total Protein, Urine: NEGATIVE
Urobilinogen, UA: 0.2 (ref 0.0–1.0)

## 2013-03-23 LAB — COMPREHENSIVE METABOLIC PANEL
ALT: 30 U/L (ref 0–35)
Albumin: 4.3 g/dL (ref 3.5–5.2)
Alkaline Phosphatase: 61 U/L (ref 39–117)
BUN: 14 mg/dL (ref 6–23)
CO2: 25 mEq/L (ref 19–32)
Calcium: 9.7 mg/dL (ref 8.4–10.5)
Chloride: 104 mEq/L (ref 96–112)
Glucose, Bld: 105 mg/dL — ABNORMAL HIGH (ref 70–99)
Potassium: 4.1 mEq/L (ref 3.5–5.1)
Total Protein: 8 g/dL (ref 6.0–8.3)

## 2013-03-23 LAB — CBC WITH DIFFERENTIAL/PLATELET
Basophils Relative: 0.9 % (ref 0.0–3.0)
Eosinophils Absolute: 0.2 10*3/uL (ref 0.0–0.7)
Eosinophils Relative: 3.4 % (ref 0.0–5.0)
Lymphocytes Relative: 40.9 % (ref 12.0–46.0)
Lymphs Abs: 2.4 10*3/uL (ref 0.7–4.0)
MCHC: 33.3 g/dL (ref 30.0–36.0)
MCV: 94.1 fl (ref 78.0–100.0)
Neutrophils Relative %: 46.8 % (ref 43.0–77.0)
Platelets: 262 10*3/uL (ref 150.0–400.0)
RBC: 4.3 Mil/uL (ref 3.87–5.11)
WBC: 5.9 10*3/uL (ref 4.5–10.5)

## 2013-03-23 LAB — LDL CHOLESTEROL, DIRECT: Direct LDL: 92.1 mg/dL

## 2013-03-23 LAB — HEMOGLOBIN A1C: Hgb A1c MFr Bld: 6.2 % (ref 4.6–6.5)

## 2013-03-23 MED ORDER — AMLODIPINE-OLMESARTAN 5-40 MG PO TABS: 1.0000 | ORAL_TABLET | Freq: Every day | ORAL | Status: DC

## 2013-03-23 NOTE — Patient Instructions (Signed)

## 2013-03-23 NOTE — Progress Notes (Signed)
Subjective:    Patient ID: Kimberly Pierce, female    DOB: 04-Nov-1955, 57 y.o.   MRN: [id number]  Hypertension This is a chronic problem. The current episode started more than 1 year ago. The problem is unchanged. The problem is uncontrolled. Pertinent negatives include no anxiety, blurred vision, chest pain, headaches, malaise/fatigue, neck pain, orthopnea, palpitations, peripheral edema, PND, shortness of breath or sweats. Past treatments include angiotensin blockers and calcium channel blockers. The current treatment provides moderate improvement. Compliance problems include medication cost, exercise and diet.       Review of Systems  Constitutional: Negative.  Negative for fever, chills, malaise/fatigue, diaphoresis, appetite change and fatigue.  HENT: Negative.   Eyes: Negative.  Negative for blurred vision.  Respiratory: Negative.  Negative for cough, choking, chest tightness, shortness of breath, wheezing and stridor.   Cardiovascular: Negative.  Negative for chest pain, palpitations, orthopnea, leg swelling and PND.  Gastrointestinal: Negative.  Negative for nausea, abdominal pain, diarrhea, constipation and blood in stool.  Endocrine: Negative.   Genitourinary: Negative.   Musculoskeletal: Negative.  Negative for arthralgias, back pain, gait problem, joint swelling, myalgias, neck pain and neck stiffness.       She has a painful cyst on her right index finger  Skin: Negative.   Allergic/Immunologic: Negative.   Neurological: Negative.  Negative for dizziness, tremors, weakness, light-headedness and headaches.  Hematological: Negative.  Negative for adenopathy. Does not bruise/bleed easily.  Psychiatric/Behavioral: Negative.        Objective:   Physical Exam  Vitals reviewed. Constitutional: She is oriented to person, place, and time. She appears well-developed and well-nourished. No distress.  HENT:  Head: Normocephalic and atraumatic.  Mouth/Throat: Oropharynx is clear and  moist. No oropharyngeal exudate.  Eyes: Conjunctivae are normal. Right eye exhibits no discharge. Left eye exhibits no discharge. No scleral icterus.  Neck: Normal range of motion. Neck supple. No JVD present. No tracheal deviation present. No thyromegaly present.  Cardiovascular: Normal rate, regular rhythm, normal heart sounds and intact distal pulses.  Exam reveals no gallop and no friction rub.   No murmur heard. Pulmonary/Chest: Effort normal and breath sounds normal. No stridor. No respiratory distress. She has no wheezes. She has no rales. She exhibits no tenderness.  Abdominal: Soft. Bowel sounds are normal. She exhibits no distension and no mass. There is no tenderness. There is no rebound and no guarding.  Musculoskeletal: Normal range of motion. She exhibits no edema and no tenderness.       Right hand: She exhibits deformity. She exhibits normal range of motion, no tenderness, no bony tenderness, normal capillary refill and no laceration.       Hands: Lymphadenopathy:    She has no cervical adenopathy.  Neurological: She is oriented to person, place, and time.  Skin: Skin is warm and dry. No rash noted. She is not diaphoretic. No erythema. No pallor.  Psychiatric: She has a normal mood and affect. Her behavior is normal. Judgment and thought content normal.     Lab Results  Component Value Date   WBC 5.8 05/05/2011   HGB 12.5 05/05/2011   HCT 37.0 05/05/2011   PLT 197.0 05/05/2011   GLUCOSE 98 05/05/2011   ALT 22 05/05/2011   AST 21 05/05/2011   NA 141 05/05/2011   K 3.7 05/05/2011   CL 105 05/05/2011   CREATININE 0.7 05/05/2011   BUN 13 05/05/2011   CO2 27 05/05/2011   TSH 0.99 05/05/2011       Assessment &  Plan:

## 2013-03-24 ENCOUNTER — Encounter: Payer: Self-pay | Admitting: Internal Medicine

## 2013-03-24 DIAGNOSIS — E559 Vitamin D deficiency, unspecified: Secondary | ICD-10-CM | POA: Insufficient documentation

## 2013-03-24 MED ORDER — CHOLECALCIFEROL 1.25 MG (50000 UT) PO TABS: 1.0000 | ORAL_TABLET | ORAL | Status: DC

## 2013-03-24 NOTE — Assessment & Plan Note (Signed)
Start cholecalciferol 50,000 u weekly

## 2013-03-24 NOTE — Assessment & Plan Note (Signed)
Her LDL is at goal

## 2013-03-24 NOTE — Assessment & Plan Note (Signed)
Start cholecalciferol She is due for a DEXA scan

## 2013-03-24 NOTE — Assessment & Plan Note (Signed)
Her BP is not well controlled I gave her samples of azor Today I will check her lytes and renal function

## 2013-03-24 NOTE — Assessment & Plan Note (Signed)
Refer to hand surgery to consider surgical excision

## 2013-03-27 ENCOUNTER — Other Ambulatory Visit: Payer: Self-pay | Admitting: Internal Medicine

## 2013-03-27 DIAGNOSIS — E559 Vitamin D deficiency, unspecified: Secondary | ICD-10-CM

## 2013-04-26 ENCOUNTER — Ambulatory Visit: Payer: PRIVATE HEALTH INSURANCE | Attending: Specialist | Admitting: Physical Therapy

## 2013-04-26 DIAGNOSIS — R269 Unspecified abnormalities of gait and mobility: Secondary | ICD-10-CM | POA: Insufficient documentation

## 2013-04-26 DIAGNOSIS — IMO0001 Reserved for inherently not codable concepts without codable children: Secondary | ICD-10-CM | POA: Insufficient documentation

## 2013-04-26 DIAGNOSIS — M25569 Pain in unspecified knee: Secondary | ICD-10-CM | POA: Insufficient documentation

## 2013-04-26 DIAGNOSIS — R262 Difficulty in walking, not elsewhere classified: Secondary | ICD-10-CM | POA: Insufficient documentation

## 2013-04-26 DIAGNOSIS — R609 Edema, unspecified: Secondary | ICD-10-CM | POA: Insufficient documentation

## 2013-04-28 ENCOUNTER — Ambulatory Visit: Payer: PRIVATE HEALTH INSURANCE | Admitting: Physical Therapy

## 2013-05-03 ENCOUNTER — Ambulatory Visit: Payer: PRIVATE HEALTH INSURANCE | Admitting: Physical Therapy

## 2013-05-09 ENCOUNTER — Ambulatory Visit: Payer: PRIVATE HEALTH INSURANCE | Attending: Specialist | Admitting: Physical Therapy

## 2013-05-09 DIAGNOSIS — M25569 Pain in unspecified knee: Secondary | ICD-10-CM | POA: Insufficient documentation

## 2013-05-09 DIAGNOSIS — R609 Edema, unspecified: Secondary | ICD-10-CM | POA: Insufficient documentation

## 2013-05-09 DIAGNOSIS — IMO0001 Reserved for inherently not codable concepts without codable children: Secondary | ICD-10-CM | POA: Insufficient documentation

## 2013-05-09 DIAGNOSIS — R269 Unspecified abnormalities of gait and mobility: Secondary | ICD-10-CM | POA: Insufficient documentation

## 2013-05-09 DIAGNOSIS — R262 Difficulty in walking, not elsewhere classified: Secondary | ICD-10-CM | POA: Insufficient documentation

## 2013-05-12 ENCOUNTER — Ambulatory Visit: Payer: PRIVATE HEALTH INSURANCE | Admitting: Physical Therapy

## 2013-05-12 ENCOUNTER — Encounter: Payer: PRIVATE HEALTH INSURANCE | Admitting: Physical Therapy

## 2013-05-16 ENCOUNTER — Ambulatory Visit: Payer: PRIVATE HEALTH INSURANCE | Admitting: Physical Therapy

## 2013-05-19 ENCOUNTER — Ambulatory Visit: Payer: PRIVATE HEALTH INSURANCE | Admitting: Physical Therapy

## 2013-05-23 ENCOUNTER — Inpatient Hospital Stay: Admission: RE | Admit: 2013-05-23 | Payer: Self-pay | Source: Ambulatory Visit

## 2013-05-24 ENCOUNTER — Encounter: Payer: PRIVATE HEALTH INSURANCE | Admitting: Rehabilitation

## 2013-05-25 ENCOUNTER — Ambulatory Visit: Payer: PRIVATE HEALTH INSURANCE | Admitting: Physical Therapy

## 2013-05-26 ENCOUNTER — Encounter: Payer: PRIVATE HEALTH INSURANCE | Admitting: Physical Therapy

## 2013-05-30 ENCOUNTER — Ambulatory Visit: Payer: PRIVATE HEALTH INSURANCE | Admitting: Physical Therapy

## 2013-06-01 ENCOUNTER — Encounter: Payer: PRIVATE HEALTH INSURANCE | Admitting: Physical Therapy

## 2013-06-08 ENCOUNTER — Encounter: Payer: PRIVATE HEALTH INSURANCE | Admitting: Physical Therapy

## 2013-09-27 ENCOUNTER — Other Ambulatory Visit: Payer: Self-pay | Admitting: Internal Medicine

## 2013-09-27 DIAGNOSIS — Z1231 Encounter for screening mammogram for malignant neoplasm of breast: Secondary | ICD-10-CM

## 2013-11-02 ENCOUNTER — Ambulatory Visit (HOSPITAL_COMMUNITY): Payer: PRIVATE HEALTH INSURANCE

## 2013-11-09 ENCOUNTER — Ambulatory Visit (HOSPITAL_COMMUNITY)
Admission: RE | Admit: 2013-11-09 | Discharge: 2013-11-09 | Disposition: A | Discharge status: Home / Self Care | Payer: 59 | Source: Ambulatory Visit | Attending: Internal Medicine | Admitting: Internal Medicine

## 2013-11-09 ENCOUNTER — Ambulatory Visit (HOSPITAL_COMMUNITY): Payer: PRIVATE HEALTH INSURANCE

## 2013-11-09 DIAGNOSIS — Z1231 Encounter for screening mammogram for malignant neoplasm of breast: Secondary | ICD-10-CM | POA: Insufficient documentation

## 2014-03-29 ENCOUNTER — Other Ambulatory Visit: Payer: Self-pay | Admitting: Internal Medicine

## 2014-03-30 ENCOUNTER — Other Ambulatory Visit: Payer: Self-pay | Admitting: Internal Medicine

## 2014-07-17 ENCOUNTER — Other Ambulatory Visit: Payer: Self-pay | Admitting: Internal Medicine

## 2014-07-20 ENCOUNTER — Ambulatory Visit (INDEPENDENT_AMBULATORY_CARE_PROVIDER_SITE_OTHER): Payer: 59 | Admitting: Internal Medicine

## 2014-07-20 ENCOUNTER — Encounter: Payer: Self-pay | Admitting: Internal Medicine

## 2014-07-20 VITALS — BP 190/114 | HR 85 | Temp 98.9°F | Ht 66.0 in | Wt 225.0 lb

## 2014-07-20 DIAGNOSIS — E785 Hyperlipidemia, unspecified: Secondary | ICD-10-CM

## 2014-07-20 DIAGNOSIS — E559 Vitamin D deficiency, unspecified: Secondary | ICD-10-CM

## 2014-07-20 DIAGNOSIS — I10 Essential (primary) hypertension: Secondary | ICD-10-CM

## 2014-07-20 DIAGNOSIS — M810 Age-related osteoporosis without current pathological fracture: Secondary | ICD-10-CM

## 2014-07-20 DIAGNOSIS — R739 Hyperglycemia, unspecified: Secondary | ICD-10-CM

## 2014-07-20 MED ORDER — AZOR 5-40 MG PO TABS: 1.0000 | ORAL_TABLET | Freq: Every day | ORAL | Status: DC

## 2014-07-20 MED ORDER — ASPIRIN EC 81 MG PO TBEC: 81.0000 mg | DELAYED_RELEASE_TABLET | Freq: Every day | ORAL | Status: DC

## 2014-07-20 NOTE — Patient Instructions (Addendum)
Please continue all other medications as before, and refills have been done if requested.  Please start Vit D OTC at 1000 units per day  Please have the pharmacy call with any other refills you may need.  Please continue your efforts at being more active, low cholesterol diet, and weight control.  Please keep your appointments with your specialists as you may have planned  Please go to the LAB in the Basement (turn left off the elevator) for the tests to be done today  You will be contacted by phone if any changes need to be made immediately.  Otherwise, you will receive a letter about your results with an explanation, but please check with MyChart first.  Please remember to sign up for MyChart if you have not done so, as this will be important to you in the future with finding out test results, communicating by private email, and scheduling acute appointments online when needed.  Please see Dr Ronnald Ramp in 4 weeks to follow up on the blood pressure and your other test results

## 2014-07-20 NOTE — Assessment & Plan Note (Signed)
stable overall by history and exam, recent data reviewed with pt, and pt to continue medical treatment as before,  to f/u any worsening symptoms or concerns Lab Results  Component Value Date   CHOL 182 03/23/2013   HDL 65.00 03/23/2013   LDLDIRECT 92.1 03/23/2013   TRIG 210.0* 03/23/2013   CHOLHDL 3 03/23/2013   For f/u lab per pt request, low chol diet

## 2014-07-20 NOTE — Assessment & Plan Note (Signed)
Declines further risedronate for now, does not want to consider further

## 2014-07-20 NOTE — Assessment & Plan Note (Signed)
Asympt, uncontrolled, o/w stable overall by history and exam, recent data reviewed with pt, and pt to continue medical treatment as before,  to f/u any worsening symptoms or concerns, for med refill, labs today and f/u with PCP 4 wks

## 2014-07-20 NOTE — Progress Notes (Signed)
   Subjective:    Patient ID: Kimberly Pierce, female    DOB: 09-01-1955, 59 y.o.   MRN: [id number]  HPI Here to f/u as PCP is not available, and BP has been elevated at work this wk, out of meds for several wks.  Pt denies chest pain, increased sob or doe, wheezing, orthopnea, PND, increased LE swelling, palpitations, dizziness or syncope.  Pt denies new neurological symptoms such as new headache, or facial or extremity weakness or numbness  Pt denies polydipsia, polyuria.   Not taking risedronate due to dizziness. Is taking asa 81 qd, just not on med list. Finished Vit D quite a while ago high dose, but none since.  Recurring Back pain has improved as of late, conts work as Research officer, political party at the hospital Past Medical History  Diagnosis Date  . Hypertension   . DVT (deep venous thrombosis)    Past Surgical History  Procedure Laterality Date  . Abdominal hysterectomy      reports that she has been smoking.  She has never used smokeless tobacco. She reports that she does not drink alcohol or use illicit drugs. family history includes Alcohol abuse in her other; Drug abuse in her other; Heart failure in her father; Hypertension in her other; Thyroid disease in her other. No Known Allergies Current Outpatient Prescriptions on File Prior to Visit  Medication Sig Dispense Refill  . loratadine (CLARITIN) 10 MG tablet Take 10 mg by mouth daily as needed.       No current facility-administered medications on file prior to visit.    Review of Systems  Constitutional: Negative for unusual diaphoresis or other sweats  HENT: Negative for ringing in ear Eyes: Negative for double vision or worsening visual disturbance.  Respiratory: Negative for choking and stridor.   Gastrointestinal: Negative for vomiting or other signifcant bowel change Genitourinary: Negative for hematuria or decreased urine volume.  Musculoskeletal: Negative for other MSK pain or swelling Skin: Negative for color change  and worsening wound.  Neurological: Negative for tremors and numbness other than noted  Psychiatric/Behavioral: Negative for decreased concentration or agitation other than above       Objective:   Physical Exam BP 190/114 mmHg  Pulse 85  Temp(Src) 98.9 F (37.2 C) (Oral)  Ht 5' 6"  (1.676 m)  Wt 225 lb (102.059 kg)  BMI 36.33 kg/m2 VS noted, not ill appearing Constitutional: Pt appears well-developed, well-nourished. Annabell Sabal HENT: Head: NCAT.  Right Ear: External ear normal.  Left Ear: External ear normal.  Eyes: . Pupils are equal, round, and reactive to light. Conjunctivae and EOM are normal Neck: Normal range of motion. Neck supple.  Cardiovascular: Normal rate and regular rhythm.   Pulmonary/Chest: Effort normal and breath sounds without rales or wheezing.  Neurological: Pt is alert. Not confused , motor grossly intact Skin: Skin is warm. No rash Psychiatric: Pt behavior is normal. No agitation.     Assessment & Plan:

## 2014-07-20 NOTE — Progress Notes (Signed)
Pre visit review using our clinic review tool, if applicable. No additional management support is needed unless otherwise documented below in the visit note. 

## 2014-07-20 NOTE — Assessment & Plan Note (Signed)
Minor in past, last a1c normal oct 2014, asympt now, for f/u lab - a1c to reasses as has been unable to lose wt

## 2014-07-20 NOTE — Assessment & Plan Note (Signed)
For f/u lab, pt to start otc  Vit d 1000 u per day

## 2014-07-21 ENCOUNTER — Telehealth: Payer: Self-pay | Admitting: Internal Medicine

## 2014-07-21 NOTE — Telephone Encounter (Signed)
emmi mailed  °

## 2014-08-28 ENCOUNTER — Encounter (HOSPITAL_COMMUNITY): Payer: Self-pay | Admitting: Emergency Medicine

## 2014-08-28 ENCOUNTER — Emergency Department (HOSPITAL_COMMUNITY): Payer: 59

## 2014-08-28 ENCOUNTER — Emergency Department (HOSPITAL_COMMUNITY)
Admission: EM | Admit: 2014-08-28 | Discharge: 2014-08-28 | Disposition: A | Discharge status: Home / Self Care | Payer: 59 | Attending: Emergency Medicine | Admitting: Emergency Medicine

## 2014-08-28 DIAGNOSIS — M79651 Pain in right thigh: Secondary | ICD-10-CM | POA: Insufficient documentation

## 2014-08-28 DIAGNOSIS — R102 Pelvic and perineal pain: Secondary | ICD-10-CM | POA: Diagnosis not present

## 2014-08-28 DIAGNOSIS — Z720 Tobacco use: Secondary | ICD-10-CM | POA: Insufficient documentation

## 2014-08-28 DIAGNOSIS — I10 Essential (primary) hypertension: Secondary | ICD-10-CM | POA: Insufficient documentation

## 2014-08-28 DIAGNOSIS — M79604 Pain in right leg: Secondary | ICD-10-CM | POA: Diagnosis present

## 2014-08-28 DIAGNOSIS — Z79899 Other long term (current) drug therapy: Secondary | ICD-10-CM | POA: Diagnosis not present

## 2014-08-28 DIAGNOSIS — Z86718 Personal history of other venous thrombosis and embolism: Secondary | ICD-10-CM | POA: Insufficient documentation

## 2014-08-28 DIAGNOSIS — M25551 Pain in right hip: Secondary | ICD-10-CM | POA: Insufficient documentation

## 2014-08-28 DIAGNOSIS — Z7982 Long term (current) use of aspirin: Secondary | ICD-10-CM | POA: Diagnosis not present

## 2014-08-28 MED ORDER — OXYCODONE-ACETAMINOPHEN 5-325 MG PO TABS
1.0000 | ORAL_TABLET | Freq: Once | ORAL | Status: AC
  Administered 2014-08-28: 1 via ORAL
  Filled 2014-08-28: qty 1

## 2014-08-28 MED ORDER — OXYCODONE-ACETAMINOPHEN 5-325 MG PO TABS: 1.0000 | ORAL_TABLET | Freq: Four times a day (QID) | ORAL | Status: DC | PRN

## 2014-08-28 NOTE — ED Provider Notes (Signed)
CSN: [id number]     Arrival date & time 08/28/14  1043 History   First MD Initiated Contact with Patient 08/28/14 1124     Chief Complaint  Patient presents with  . Leg Pain  . Pelvic Pain     (Consider location/radiation/quality/duration/timing/severity/associated sxs/prior Treatment) Patient is a 59 y.o. female presenting with leg pain and pelvic pain. The history is provided by the patient.  Leg Pain Location:  Leg Time since incident:  1 week Injury: no   Leg location:  R leg Pain details:    Quality:  Aching   Radiates to:  Groin   Severity:  Mild   Onset quality:  Gradual   Duration:  1 week   Timing:  Constant   Progression:  Unchanged Chronicity:  New Dislocation: no   Foreign body present:  No foreign bodies Prior injury to area:  Yes Relieved by:  Nothing Worsened by:  Nothing tried Ineffective treatments:  NSAIDs Associated symptoms: no back pain, no fatigue, no fever and no neck pain   Pelvic Pain Pertinent negatives include no chest pain, no abdominal pain, no headaches and no shortness of breath.    Past Medical History  Diagnosis Date  . Hypertension   . DVT (deep venous thrombosis)    Past Surgical History  Procedure Laterality Date  . Abdominal hysterectomy     Family History  Problem Relation Age of Onset  . Heart failure Father   . Alcohol abuse Other   . Drug abuse Other   . Hypertension Other   . Thyroid disease Other    History  Substance Use Topics  . Smoking status: Current Some Day Smoker  . Smokeless tobacco: Never Used  . Alcohol Use: No     Comment: occaisional   OB History    No data available     Review of Systems  Constitutional: Negative for fever and fatigue.  HENT: Negative for congestion and drooling.   Eyes: Negative for pain.  Respiratory: Negative for cough and shortness of breath.   Cardiovascular: Negative for chest pain.  Gastrointestinal: Negative for nausea, vomiting, abdominal pain and diarrhea.   Genitourinary: Positive for pelvic pain. Negative for dysuria and hematuria.  Musculoskeletal: Negative for back pain, gait problem and neck pain.  Skin: Negative for color change.  Neurological: Negative for dizziness and headaches.  Hematological: Negative for adenopathy.  Psychiatric/Behavioral: Negative for behavioral problems.  All other systems reviewed and are negative.     Allergies  Review of patient's allergies indicates no known allergies.  Home Medications   Prior to Admission medications   Medication Sig Start Date End Date Taking? Authorizing Provider  aspirin EC 81 MG tablet Take 1 tablet (81 mg total) by mouth daily. 07/20/14  Yes Biagio Borg, MD  AZOR 5-40 MG per tablet Take 1 tablet by mouth daily. 07/20/14  Yes Biagio Borg, MD  ibuprofen (ADVIL,MOTRIN) 200 MG tablet Take 800 mg by mouth every 6 (six) hours as needed for mild pain or moderate pain.   Yes Historical Provider, MD  loratadine (CLARITIN) 10 MG tablet Take 10 mg by mouth daily as needed.     Yes Historical Provider, MD  Tetrahydrozoline HCl (VISINE OP) Place 2 drops into both eyes daily.   Yes Historical Provider, MD   BP 132/98 mmHg  Pulse 70  Temp(Src) 97.7 F (36.5 C) (Oral)  Resp 16  SpO2 98% Physical Exam  Constitutional: She is oriented to person, place, and time. She  appears well-developed and well-nourished.  HENT:  Head: Normocephalic.  Mouth/Throat: Oropharynx is clear and moist. No oropharyngeal exudate.  Eyes: Conjunctivae and EOM are normal. Pupils are equal, round, and reactive to light.  Neck: Normal range of motion. Neck supple.  Cardiovascular: Normal rate, regular rhythm, normal heart sounds and intact distal pulses.  Exam reveals no gallop and no friction rub.   No murmur heard. Pulmonary/Chest: Effort normal and breath sounds normal. No respiratory distress. She has no wheezes.  Abdominal: Soft. Bowel sounds are normal. There is no tenderness. There is no rebound and no  guarding.  Musculoskeletal: Normal range of motion. She exhibits tenderness. She exhibits no edema.  Normal range of motion of the hips without pain.  Focal tenderness of the right medial thigh extending up to the right groin.  No right inguinal lymphadenopathy or hernia noted.  2+ distal pulses in bilateral lower extremities.  Neurological: She is alert and oriented to person, place, and time.  Skin: Skin is warm and dry.  Psychiatric: She has a normal mood and affect. Her behavior is normal.  Nursing note and vitals reviewed.   ED Course  Procedures (including critical care time) Labs Review Labs Reviewed - No data to display  Imaging Review Dg Hip Unilat With Pelvis 2-3 Views Right  08/28/2014   CLINICAL DATA:  New right hip pain radiating down the right leg since Saturday. No known trauma.  EXAM: RIGHT HIP (WITH PELVIS) 2-3 VIEWS  COMPARISON:  None.  FINDINGS: No evidence of fracture, osteonecrosis, or focal bone lesion. There is minor posterior marginal spurring about the right hip without joint narrowing. No evidence of pelvic ring fracture or bone lesion. Lower lumbar facet arthropathy on the right.  IMPRESSION: No explanation for acute hip pain.   Electronically Signed   By: Monte Fantasia M.D.   On: 08/28/2014 12:27     EKG Interpretation None      MDM   Final diagnoses:  Right hip pain    12:04 PM 59 y.o. female w hx of HTN, remote history of DVT not on anticoagulation who presents with right thigh pain for the last week. She denies any injuries but states that she did fall last year. Pain radiates from the right groin to the right medial thigh. Lower extremity Azor grossly symmetric with 2+ distal pulses. Given history of DVT we'll get ultrasound. Will also get screening plain film although symptoms likely of muscular origin.  2:12 PM: I interpreted/reviewed the labs and/or imaging which were non-contributory.  [place] neg for DVT. Plain film neg. Likely msk cause of  pain. I have discussed the diagnosis/risks/treatment options with the patient and believe the pt to be eligible for discharge home to follow-up with her pcp. We also discussed returning to the ED immediately if new or worsening sx occur. We discussed the sx which are most concerning (e.g., worsening pain, fever) that necessitate immediate return. Medications administered to the patient during their visit and any new prescriptions provided to the patient are listed below.  Medications given during this visit Medications  oxyCODONE-acetaminophen (PERCOCET/ROXICET) 5-325 MG per tablet 1 tablet (1 tablet Oral Given 08/28/14 1208)    New Prescriptions   OXYCODONE-ACETAMINOPHEN (PERCOCET) 5-325 MG PER TABLET    Take 1 tablet by mouth every 6 (six) hours as needed.      Pamella Pert, MD 08/28/14 [phone]

## 2014-08-28 NOTE — Progress Notes (Signed)
*  Preliminary Results* Right lower extremity venous duplex completed. Right lower extremity is negative for deep vein thrombosis. There is no evidence of right Baker's cyst.  08/28/2014 1:12 PM  Maudry Mayhew, RVT, RDCS, RDMS

## 2014-08-28 NOTE — ED Notes (Signed)
US at bedside

## 2014-08-28 NOTE — ED Notes (Signed)
Pt c/o right upper thigh pain that has been going on since last week but got worse over the weekend. Pt states the pain radiates up to her pelvic area. 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

## 2014-08-28 NOTE — ED Notes (Signed)
Patient transported to X-ray 

## 2014-09-04 ENCOUNTER — Ambulatory Visit (INDEPENDENT_AMBULATORY_CARE_PROVIDER_SITE_OTHER): Payer: 59 | Admitting: Internal Medicine

## 2014-09-04 DIAGNOSIS — Z8781 Personal history of (healed) traumatic fracture: Secondary | ICD-10-CM | POA: Diagnosis not present

## 2014-09-04 DIAGNOSIS — R1030 Lower abdominal pain, unspecified: Secondary | ICD-10-CM

## 2014-09-04 DIAGNOSIS — M25561 Pain in right knee: Secondary | ICD-10-CM | POA: Diagnosis not present

## 2014-09-04 DIAGNOSIS — R1031 Right lower quadrant pain: Secondary | ICD-10-CM

## 2014-09-04 MED ORDER — MELOXICAM 7.5 MG PO TABS: ORAL_TABLET | ORAL | Status: DC

## 2014-09-04 NOTE — Patient Instructions (Signed)
Use an anti-inflammatory cream such as Aspercreme or Zostrix cream twice a day to the affected area as needed. In lieu of this warm moist compresses or  hot water bottle can be used. Do not apply ice .

## 2014-09-04 NOTE — Progress Notes (Signed)
   Subjective:    Patient ID: Kimberly Pierce, female    DOB: December 16, 1955, 59 y.o.   MRN: [id number]  HPI 0n 08/25/2014 she began to have right inguinal pain without trauma or injury. The pain has progressed. It is worse with sitting or walking. Also with walking she has discomfort in the right calf intermittently. She also has discomfort in the right lateral decubitus position  She was seen in the ER 3/21; films of the hip were negative. She was prescribed Percocet and nonsteroidals which were of no benefit. She actually developed a rash with Percocet. She borrowed a muscle relaxant from a friend and this seemed to help.  Past history includes fracture of the right patella in September 2014. She was out of work for 3 months.    Review of Systems She denies fever, chills, sweats, or weight loss .  She has no abdominal pain, bowel changes, melena, rectal bleeding.  She has no dysuria, pyuria, or hematuria.  There is no numbness, tingling, weakness in the right lower extremity.  She has no loss of control of bladder or bowels.    Objective:   Physical Exam Pertinent or positive findings include: She has a loud S4.  There is marked crepitus and suggestion of effusion of the right knee. Fusiform changes also present in the knee. With range of motion of the knee she has pain in the right inguinal area.  I cannot appreciate any inguinal lymphadenopathy.  She did not have significant pain with range of motion of the right hip.  DTRs are 1/2+ @ the knees. Strength and tone are normal Pedal pulses are decreased.  Homans sign is negative.   General appearance :adequately nourished; in no distress. Eyes: No conjunctival inflammation or scleral icterus is present. Heart:  Normal rate and regular rhythm. S1 and S2 normal without gallop, murmur, click,or  rub .  Lungs:Chest clear to auscultation; no wheezes, rhonchi,rales ,or rubs present.No increased work of breathing.  Abdomen: bowel sounds  normal, soft and non-tender without masses, organomegaly or hernias noted.  No guarding or rebound.  Vascular : all pulses equal ; no bruits present. Skin:Warm & dry.  Intact without suspicious lesions or rashes ; no tenting  Lymphatic: No lymphadenopathy is noted about the head, neck, axilla        Assessment & Plan:  #1 right inguinal pain; this is most likely referred pain from the right knee. Clinically I cannot appreciate bursitis of the right hip.  Plan: Trial of meloxicam 7.5 mg twice a day. Referral to sports medicine specialist.

## 2014-09-04 NOTE — Progress Notes (Signed)
Pre visit review using our clinic review tool, if applicable. No additional management support is needed unless otherwise documented below in the visit note. 

## 2014-09-11 ENCOUNTER — Encounter: Payer: Self-pay | Admitting: Internal Medicine

## 2014-09-11 ENCOUNTER — Other Ambulatory Visit (INDEPENDENT_AMBULATORY_CARE_PROVIDER_SITE_OTHER): Payer: 59

## 2014-09-11 ENCOUNTER — Ambulatory Visit (INDEPENDENT_AMBULATORY_CARE_PROVIDER_SITE_OTHER): Payer: 59 | Admitting: Internal Medicine

## 2014-09-11 VITALS — BP 168/96 | HR 85 | Temp 98.6°F | Resp 16 | Ht 66.0 in | Wt 221.0 lb

## 2014-09-11 DIAGNOSIS — E785 Hyperlipidemia, unspecified: Secondary | ICD-10-CM | POA: Diagnosis not present

## 2014-09-11 DIAGNOSIS — R011 Cardiac murmur, unspecified: Secondary | ICD-10-CM | POA: Diagnosis not present

## 2014-09-11 DIAGNOSIS — I10 Essential (primary) hypertension: Secondary | ICD-10-CM | POA: Diagnosis not present

## 2014-09-11 DIAGNOSIS — M170 Bilateral primary osteoarthritis of knee: Secondary | ICD-10-CM | POA: Diagnosis not present

## 2014-09-11 LAB — CBC WITH DIFFERENTIAL/PLATELET
BASOS ABS: 0.1 10*3/uL (ref 0.0–0.1)
BASOS PCT: 1.1 % (ref 0.0–3.0)
Eosinophils Absolute: 0.3 10*3/uL (ref 0.0–0.7)
Eosinophils Relative: 2.4 % (ref 0.0–5.0)
HCT: 34.3 % — ABNORMAL LOW (ref 36.0–46.0)
Hemoglobin: 11.4 g/dL — ABNORMAL LOW (ref 12.0–15.0)
LYMPHS PCT: 19.5 % (ref 12.0–46.0)
Lymphs Abs: 2.1 10*3/uL (ref 0.7–4.0)
MCHC: 33.3 g/dL (ref 30.0–36.0)
MCV: 93.3 fl (ref 78.0–100.0)
MONOS PCT: 7.5 % (ref 3.0–12.0)
Monocytes Absolute: 0.8 10*3/uL (ref 0.1–1.0)
Neutro Abs: 7.4 10*3/uL (ref 1.4–7.7)
Neutrophils Relative %: 69.5 % (ref 43.0–77.0)
Platelets: 369 10*3/uL (ref 150.0–400.0)
RBC: 3.68 Mil/uL — ABNORMAL LOW (ref 3.87–5.11)
RDW: 13.4 % (ref 11.5–15.5)
WBC: 10.6 10*3/uL — AB (ref 4.0–10.5)

## 2014-09-11 LAB — COMPREHENSIVE METABOLIC PANEL
ALBUMIN: 4 g/dL (ref 3.5–5.2)
ALT: 14 U/L (ref 0–35)
AST: 13 U/L (ref 0–37)
Alkaline Phosphatase: 72 U/L (ref 39–117)
BILIRUBIN TOTAL: 0.3 mg/dL (ref 0.2–1.2)
BUN: 17 mg/dL (ref 6–23)
CHLORIDE: 105 meq/L (ref 96–112)
CO2: 27 meq/L (ref 19–32)
Calcium: 9.5 mg/dL (ref 8.4–10.5)
Creatinine, Ser: 0.46 mg/dL (ref 0.40–1.20)
GFR: 178.41 mL/min (ref >60.00)
Glucose, Bld: 105 mg/dL — ABNORMAL HIGH (ref 70–99)
POTASSIUM: 3.7 meq/L (ref 3.5–5.1)
SODIUM: 139 meq/L (ref 135–145)
TOTAL PROTEIN: 7.8 g/dL (ref 6.0–8.3)

## 2014-09-11 LAB — LIPID PANEL
CHOL/HDL RATIO: 4
Cholesterol: 166 mg/dL (ref 0–200)
HDL: 44.3 mg/dL (ref >39.00)
LDL CALC: 97 mg/dL (ref 0–99)
NONHDL: 121.7
TRIGLYCERIDES: 122 mg/dL (ref 0.0–149.0)
VLDL: 24.4 mg/dL (ref 0.0–40.0)

## 2014-09-11 LAB — TSH: TSH: 1.21 u[IU]/mL (ref 0.35–4.50)

## 2014-09-11 MED ORDER — CHLORTHALIDONE 25 MG PO TABS: 25.0000 mg | ORAL_TABLET | Freq: Every day | ORAL | Status: DC

## 2014-09-11 MED ORDER — AZOR 5-40 MG PO TABS: 1.0000 | ORAL_TABLET | Freq: Every day | ORAL | Status: DC

## 2014-09-11 MED ORDER — TRAMADOL HCL 50 MG PO TABS: 50.0000 mg | ORAL_TABLET | Freq: Two times a day (BID) | ORAL | Status: DC | PRN

## 2014-09-11 NOTE — Progress Notes (Signed)
Subjective:    Patient ID: [name], female    DOB: [birth year], 59 y.o.   MRN: [id number]  Hypertension This is a chronic problem. The current episode started more than 1 year ago. The problem has been gradually worsening since onset. The problem is uncontrolled. Pertinent negatives include no anxiety, blurred vision, chest pain, headaches, malaise/fatigue, neck pain, orthopnea, palpitations, peripheral edema, PND, shortness of breath or sweats. Risk factors for coronary artery disease include smoking/tobacco exposure. Past treatments include angiotensin blockers and calcium channel blockers. Compliance problems include diet and exercise.       Review of Systems  Constitutional: Negative.  Negative for fever, chills, malaise/fatigue, diaphoresis, appetite change and fatigue.  HENT: Negative.   Eyes: Negative.  Negative for blurred vision.  Respiratory: Negative.  Negative for cough, choking, chest tightness, shortness of breath and stridor.   Cardiovascular: Negative.  Negative for chest pain, palpitations, orthopnea, leg swelling and PND.  Gastrointestinal: Negative.  Negative for nausea, vomiting, abdominal pain, diarrhea, constipation and blood in stool.  Endocrine: Negative.   Genitourinary: Negative.   Musculoskeletal: Positive for arthralgias. Negative for myalgias, back pain, joint swelling, gait problem and neck pain.       She has chronic rt knee pain for about 2 years after she sustained a fracture  Skin: Negative.  Negative for rash.  Neurological: Negative.  Negative for headaches.  Hematological: Negative.  Negative for adenopathy. Does not bruise/bleed easily.  Psychiatric/Behavioral: Negative.        Objective:   Physical Exam  Constitutional: She is oriented to person, place, and time. She appears well-developed and well-nourished. No distress.  HENT:  Head: Normocephalic and atraumatic.  Mouth/Throat: Oropharynx is clear and moist. No oropharyngeal exudate.    Eyes: Conjunctivae are normal. Right eye exhibits no discharge. Left eye exhibits no discharge. No scleral icterus.  Neck: Normal range of motion. Neck supple. No JVD present. No tracheal deviation present. No thyromegaly present.  Cardiovascular: Normal rate, regular rhythm, S1 normal, S2 normal and intact distal pulses.  Exam reveals no gallop and no friction rub.   Murmur heard.  Systolic murmur is present with a grade of 1/6   No diastolic murmur is present  1/6 SEM over the RUSB  Pulmonary/Chest: Effort normal and breath sounds normal. No stridor. No respiratory distress. She has no wheezes. She has no rales. She exhibits no tenderness.  Abdominal: Soft. Bowel sounds are normal. She exhibits no distension and no mass. There is no tenderness. There is no rebound and no guarding.  Musculoskeletal: Normal range of motion. She exhibits no edema or tenderness.       Right knee: She exhibits deformity (moderate DJD changes). She exhibits normal range of motion, no swelling, no effusion, no ecchymosis, no laceration, no erythema, normal alignment, no LCL laxity and normal patellar mobility. No tenderness found.  Lymphadenopathy:    She has no cervical adenopathy.  Neurological: She is oriented to person, place, and time.  Skin: Skin is warm and dry. No rash noted. She is not diaphoretic. No erythema. No pallor.  Psychiatric: She has a normal mood and affect. Her behavior is normal. Judgment and thought content normal.  Vitals reviewed.    Lab Results  Component Value Date   WBC 5.9 03/23/2013   HGB 13.5 03/23/2013   HCT 40.5 03/23/2013   PLT 262.0 03/23/2013   GLUCOSE 105* 03/23/2013   CHOL 182 03/23/2013   TRIG 210.0* 03/23/2013   HDL 65.00 03/23/2013  LDLDIRECT 92.1 03/23/2013   ALT 30 03/23/2013   AST 23 03/23/2013   NA 140 03/23/2013   K 4.1 03/23/2013   CL 104 03/23/2013   CREATININE 0.6 03/23/2013   BUN 14 03/23/2013   CO2 25 03/23/2013   TSH 0.64 03/23/2013   HGBA1C  6.2 03/23/2013       Assessment & Plan:

## 2014-09-11 NOTE — Progress Notes (Signed)
Pre visit review using our clinic review tool, if applicable. No additional management support is needed unless otherwise documented below in the visit note. 

## 2014-09-11 NOTE — Patient Instructions (Signed)

## 2014-09-12 ENCOUNTER — Encounter: Payer: Self-pay | Admitting: Internal Medicine

## 2014-09-12 NOTE — Assessment & Plan Note (Signed)
She has no s/s related to this Her EKG shows poor RWP and is unchanged from prior EKG This appears to be AS - will get an ECHO done to confirm and the check the severity of this

## 2014-09-12 NOTE — Assessment & Plan Note (Signed)
The pain interferes with her work Will offer tramadol in addition to motrin for pain relief She will see sports medicine soon

## 2014-09-12 NOTE — Assessment & Plan Note (Signed)
Her BP is not well controlled Will check her labs for end organ damage and secondary causes of THN Will add chlorthalidone to the Azor for better BP control I asked her to quit smoking

## 2014-09-13 DIAGNOSIS — Z0279 Encounter for issue of other medical certificate: Secondary | ICD-10-CM

## 2014-09-14 ENCOUNTER — Telehealth: Payer: Self-pay | Admitting: Internal Medicine

## 2014-09-14 NOTE — Telephone Encounter (Signed)
Patient called to make sure we got her FMLA papers. They were faxed yesterday 04/06

## 2014-09-18 ENCOUNTER — Encounter (HOSPITAL_COMMUNITY): Payer: Self-pay | Admitting: *Deleted

## 2014-09-18 ENCOUNTER — Emergency Department (HOSPITAL_COMMUNITY)
Admission: EM | Admit: 2014-09-18 | Discharge: 2014-09-18 | Disposition: A | Discharge status: Home / Self Care | Payer: 59 | Attending: Emergency Medicine | Admitting: Emergency Medicine

## 2014-09-18 DIAGNOSIS — Z79899 Other long term (current) drug therapy: Secondary | ICD-10-CM | POA: Insufficient documentation

## 2014-09-18 DIAGNOSIS — I10 Essential (primary) hypertension: Secondary | ICD-10-CM | POA: Insufficient documentation

## 2014-09-18 DIAGNOSIS — Z720 Tobacco use: Secondary | ICD-10-CM | POA: Insufficient documentation

## 2014-09-18 DIAGNOSIS — R52 Pain, unspecified: Secondary | ICD-10-CM | POA: Diagnosis present

## 2014-09-18 DIAGNOSIS — M1991 Primary osteoarthritis, unspecified site: Secondary | ICD-10-CM | POA: Diagnosis not present

## 2014-09-18 DIAGNOSIS — M8949 Other hypertrophic osteoarthropathy, multiple sites: Secondary | ICD-10-CM

## 2014-09-18 DIAGNOSIS — M150 Primary generalized (osteo)arthritis: Secondary | ICD-10-CM

## 2014-09-18 DIAGNOSIS — M159 Polyosteoarthritis, unspecified: Secondary | ICD-10-CM

## 2014-09-18 DIAGNOSIS — Z7982 Long term (current) use of aspirin: Secondary | ICD-10-CM | POA: Insufficient documentation

## 2014-09-18 DIAGNOSIS — Z86718 Personal history of other venous thrombosis and embolism: Secondary | ICD-10-CM | POA: Insufficient documentation

## 2014-09-18 MED ORDER — HYDROCODONE-ACETAMINOPHEN 5-325 MG PO TABS: 1.0000 | ORAL_TABLET | ORAL | Status: DC | PRN

## 2014-09-18 MED ORDER — METHOCARBAMOL 500 MG PO TABS: 500.0000 mg | ORAL_TABLET | Freq: Two times a day (BID) | ORAL | Status: DC

## 2014-09-18 NOTE — Discharge Instructions (Signed)

## 2014-09-18 NOTE — ED Notes (Signed)
PT continues to c/o body aches x 4 weeks.  Seen by her pcp who gave her pain meds and scheduled her to see a specialist on Wed, but pt cannot wait that long.

## 2014-09-20 ENCOUNTER — Ambulatory Visit (INDEPENDENT_AMBULATORY_CARE_PROVIDER_SITE_OTHER)
Admission: RE | Admit: 2014-09-20 | Discharge: 2014-09-20 | Disposition: A | Discharge status: Home / Self Care | Payer: 59 | Source: Ambulatory Visit | Attending: Family Medicine | Admitting: Family Medicine

## 2014-09-20 ENCOUNTER — Ambulatory Visit (INDEPENDENT_AMBULATORY_CARE_PROVIDER_SITE_OTHER): Payer: 59 | Admitting: Family Medicine

## 2014-09-20 ENCOUNTER — Encounter: Payer: Self-pay | Admitting: Family Medicine

## 2014-09-20 VITALS — BP 130/74 | HR 44 | Ht 66.0 in | Wt 213.0 lb

## 2014-09-20 DIAGNOSIS — M23306 Other meniscus derangements, unspecified meniscus, right knee: Secondary | ICD-10-CM | POA: Diagnosis not present

## 2014-09-20 DIAGNOSIS — M25561 Pain in right knee: Secondary | ICD-10-CM

## 2014-09-20 MED ORDER — VITAMIN D (ERGOCALCIFEROL) 1.25 MG (50000 UNIT) PO CAPS: 50000.0000 [IU] | ORAL_CAPSULE | ORAL | Status: DC

## 2014-09-20 NOTE — Patient Instructions (Addendum)
Good to see you.  Lets get xray today.  Ice 20 minutes 2 times daily. Usually after activity and before bed. Try brace with activity Exercises 3 times a week.  You need a lot of vitamin D New prescription to take weekly for next 12 weeks.  Fish oil 2 grams daily Turmeric 526m daily See me again in 3 weeks.

## 2014-09-20 NOTE — Assessment & Plan Note (Signed)
Patient is an injection today. We discussed over-the-counter medications in patient was given a topical anti-inflammatory. We discussed icing protocol and proper shoes. We discussed ergonomic changes at work. Patient did work with Product/process development scientist today to learn exercises in greater detail. Patient will make these changes and come back and see me again in 3-4 weeks. Continuing to have difficulty further imaging may be required. X-rays pending at this time.

## 2014-09-20 NOTE — Progress Notes (Signed)
Pre visit review using our clinic review tool, if applicable. No additional management support is needed unless otherwise documented below in the visit note. 

## 2014-09-20 NOTE — Progress Notes (Signed)
[facility] [address] [place], [place] [address] Phone: [phone] Subjective:    I'm seeing this patient by the request  of:  [name], MD   CC: Right knee pain and arthralgia  [id number] Kimberly Pierce is a 59 y.o. female coming in with complaint of bilateral knee pain right greater than left. Patient states that her right knee is giving her significant more pain then she's had previously. Patient states that she is having such difficulty that she has even had to go to the emergency department. Patient has been taking tramadol very regularly. Patient also has muscle relaxers and anti-inflammatories. Patient states that the knee sometimes feels like it wants to lock on her. Seems to be worse over the course last 2 weeks and does not remember any specific injury. Patient states 2 years ago she was in an accident at work when she fell and she thinks that this contributed to all her aches and pains. Patient states that it can wake her up at night and puts the severity of pain a 9 out of 10.     Past medical history, social, surgical and family history all reviewed in electronic medical record.   Review of Systems: No headache, visual changes, nausea, vomiting, diarrhea, constipation, dizziness, abdominal pain, skin rash, fevers, chills, night sweats, weight loss, swollen lymph nodes, body aches, joint swelling, muscle aches, chest pain, shortness of breath, mood changes.   Objective Blood pressure 130/74, pulse 44, height 5' 6"  (1.676 m), weight 213 lb (96.616 kg), SpO2 98 %.  General: No apparent distress alert and oriented x3 mood and affect normal, dressed appropriately.  HEENT: Pupils equal, extraocular movements intact  Respiratory: Patient's speak in full sentences and does not appear short of breath  Cardiovascular: No lower extremity edema, non tender, no erythema  Skin: Warm dry intact with no signs of infection or rash on extremities or on  axial skeleton.  Abdomen: Soft nontender  Neuro: Cranial nerves II through XII are intact, neurovascularly intact in all extremities with 2+ DTRs and 2+ pulses.  Lymph: No lymphadenopathy of posterior or anterior cervical chain or axillae bilaterally.  Gait normal with good balance and coordination.  MSK:  Non tender with full range of motion and good stability and symmetric strength and tone of shoulders, elbows, wrist, hip, and ankles bilaterally.  Knee: Right Normal to inspection with no erythema or effusion or obvious bony abnormalities. Tender to palpation over the right medial joint line ROM full in flexion and extension and lower leg rotation. Ligaments with solid consistent endpoints including ACL, PCL, LCL, MCL. Positive Mcmurray's, Apley's, and Thessalonian tests.  painful patellar compression. Patellar glide with moderate crepitus. Patellar and quadriceps tendons unremarkable. Hamstring and quadriceps strength is normal.  Contralateral knee minorly tender over the medial joint line  MSK US performed of: Right knee This study was ordered, performed, and interpreted by Charlann Boxer D.O.  Knee: All structures visualized. Anteromedial meniscus does have a tear in approximately 70% of the meniscus. No significant disc placement Patellar Tendon unremarkable on long and transverse views without effusion. No abnormality of prepatellar bursa. LCL and MCL unremarkable on long and transverse views. No abnormality of origin of medial or lateral head of the gastrocnemius.  IMPRESSION:  Meniscal tear  Procedure: Real-time Ultrasound Guided Injection of right knee Device: GE Logiq E  Ultrasound guided injection is preferred based studies that show increased duration, increased effect, greater accuracy, decreased procedural pain, increased response  rate, and decreased cost with ultrasound guided versus blind injection.  Verbal informed consent obtained.  Time-out conducted.  Noted no  overlying erythema, induration, or other signs of local infection.  Skin prepped in a sterile fashion.  Local anesthesia: Topical Ethyl chloride.  With sterile technique and under real time ultrasound guidance: With a 22-gauge 2 inch needle patient was injected with 4 cc of 0.5% Marcaine and 1 cc of Kenalog 40 mg/dL. This was from a superior lateral approach.  Completed without difficulty  Pain immediately resolved suggesting accurate placement of the medication.  Advised to call if fevers/chills, erythema, induration, drainage, or persistent bleeding.  Images permanently stored and available for review in the ultrasound unit.  Impression: Technically successful ultrasound guided injection.   Procedure note 39688; 15 minutes spent for Therapeutic exercises as stated in above notes.  This included exercises focusing on stretching, strengthening, with significant focus on eccentric aspects.  Patient was given extension and flexion exercises. We discussed icing regimen. We discussed squatting as well as what activities to avoid. Discussed number sets frequency and duration. Proper technique shown and discussed handout in great detail with ATC.  All questions were discussed and answered.      Impression and Recommendations:     This case required medical decision making of moderate complexity.

## 2014-09-25 NOTE — ED Provider Notes (Signed)
CSN: [id number]     Arrival date & time 09/18/14  0701 History   First MD Initiated Contact with Patient 09/18/14 [phone]     Chief Complaint  Patient presents with  . Generalized Body Aches     (Consider location/radiation/quality/duration/timing/severity/associated sxs/prior Treatment) HPI   59 year old female with arthralgia. Multiple joints. Ongoing for weeks. Denies any acute trauma. No swelling. Pain is primarily in bilateral knees, but also bilateral hips and her left elbow. She reports that symptoms are fairly constant throughout the day. No rash. Has been taking Tylenol with minimal relief. No fevers or chills.  Past Medical History  Diagnosis Date  . Hypertension   . DVT (deep venous thrombosis)    Past Surgical History  Procedure Laterality Date  . Abdominal hysterectomy     Family History  Problem Relation Age of Onset  . Heart failure Father   . Alcohol abuse Other   . Drug abuse Other   . Hypertension Other   . Thyroid disease Other    History  Substance Use Topics  . Smoking status: Current Some Day Smoker  . Smokeless tobacco: Never Used  . Alcohol Use: No     Comment: occaisional   OB History    No data available     Review of Systems  All systems reviewed and negative, other than as noted in HPI.   Allergies  Percocet  Home Medications   Prior to Admission medications   Medication Sig Start Date End Date Taking? Authorizing Provider  aspirin EC 81 MG tablet Take 1 tablet (81 mg total) by mouth daily. 07/20/14   Biagio Borg, MD  AZOR 5-40 MG per tablet Take 1 tablet by mouth daily. 09/11/14   Janith Lima, MD  chlorthalidone (HYGROTON) 25 MG tablet Take 1 tablet (25 mg total) by mouth daily. 09/11/14   Janith Lima, MD  HYDROcodone-acetaminophen (NORCO/VICODIN) 5-325 MG per tablet Take 1 tablet by mouth every 4 (four) hours as needed. 09/18/14   Virgel Manifold, MD  ibuprofen (ADVIL,MOTRIN) 200 MG tablet Take 800 mg by mouth every 6 (six) hours as  needed for mild pain or moderate pain.    Historical Provider, MD  loratadine (CLARITIN) 10 MG tablet Take 10 mg by mouth daily as needed.      Historical Provider, MD  methocarbamol (ROBAXIN) 500 MG tablet Take 1 tablet (500 mg total) by mouth 2 (two) times daily. 09/18/14   Virgel Manifold, MD  Tetrahydrozoline HCl (VISINE OP) Place 2 drops into both eyes daily.    Historical Provider, MD  traMADol (ULTRAM) 50 MG tablet Take 1 tablet (50 mg total) by mouth every 12 (twelve) hours as needed. 09/11/14   Janith Lima, MD  Vitamin D, Ergocalciferol, (DRISDOL) 50000 UNITS CAPS capsule Take 1 capsule (50,000 Units total) by mouth every 7 (seven) days. 09/20/14   Lyndal Pulley, DO   BP 158/85 mmHg  Pulse 84  Temp(Src) 98.1 F (36.7 C) (Oral)  Resp 16  Ht 5' 6"  (1.676 m)  Wt 221 lb (100.245 kg)  BMI 35.69 kg/m2  SpO2 100% Physical Exam  Constitutional: She appears well-developed and well-nourished. No distress.  HENT:  Head: Normocephalic and atraumatic.  Eyes: Conjunctivae are normal. Right eye exhibits no discharge. Left eye exhibits no discharge.  Neck: Neck supple.  Cardiovascular: Normal rate, regular rhythm and normal heart sounds.  Exam reveals no gallop and no friction rub.   No murmur heard. Pulmonary/Chest: Effort normal and breath sounds  normal. No respiratory distress.  Abdominal: Soft. She exhibits no distension. There is no tenderness.  Musculoskeletal: She exhibits no edema or tenderness.  No appreciable swelling or effusion of the large joints. Patient is able to actively range of the large joints without apparent discomfort. No concerning skin changes. Neurovascularly intact.  Neurological: She is alert.  Skin: Skin is warm and dry.  Psychiatric: She has a normal mood and affect. Her behavior is normal. Thought content normal.  Nursing note and vitals reviewed.   ED Course  Procedures (including critical care time) Labs Review Labs Reviewed - No data to  display  Imaging Review No results found.   EKG Interpretation None      MDM   Final diagnoses:  Primary osteoarthritis involving multiple joints    59 year old female with polyarthralgia. Exam is pretty nonfocal. No joint swelling/effusion. No concerning skin changes. Neurovascularly intact. Denies any acute trauma. Plan symptomatically treatment and outpatient follow-up.    Virgel Manifold, MD 09/25/14 [phone]

## 2014-10-04 ENCOUNTER — Ambulatory Visit (INDEPENDENT_AMBULATORY_CARE_PROVIDER_SITE_OTHER): Payer: 59 | Admitting: Internal Medicine

## 2014-10-04 ENCOUNTER — Ambulatory Visit (INDEPENDENT_AMBULATORY_CARE_PROVIDER_SITE_OTHER)
Admission: RE | Admit: 2014-10-04 | Discharge: 2014-10-04 | Disposition: A | Discharge status: Home / Self Care | Payer: 59 | Source: Ambulatory Visit | Attending: Internal Medicine | Admitting: Internal Medicine

## 2014-10-04 ENCOUNTER — Encounter: Payer: Self-pay | Admitting: Internal Medicine

## 2014-10-04 VITALS — BP 152/70 | HR 53 | Temp 98.3°F | Resp 16 | Ht 66.0 in | Wt 218.0 lb

## 2014-10-04 DIAGNOSIS — M5412 Radiculopathy, cervical region: Secondary | ICD-10-CM | POA: Diagnosis not present

## 2014-10-04 DIAGNOSIS — R011 Cardiac murmur, unspecified: Secondary | ICD-10-CM | POA: Diagnosis not present

## 2014-10-04 DIAGNOSIS — I10 Essential (primary) hypertension: Secondary | ICD-10-CM | POA: Diagnosis not present

## 2014-10-04 NOTE — Progress Notes (Signed)
Pre visit review using our clinic review tool, if applicable. No additional management support is needed unless otherwise documented below in the visit note. 

## 2014-10-04 NOTE — Assessment & Plan Note (Signed)
Her BP is well controlled 

## 2014-10-04 NOTE — Assessment & Plan Note (Signed)
There are no concerns for radiculopathy Plain films show minimal spondylosis Will cont the current meds for pain relief Will consider whether or not an MRI is indicated over the next few months

## 2014-10-04 NOTE — Progress Notes (Signed)
Subjective:    Patient ID: Kimberly Pierce, female    DOB: 27-Oct-1955, 59 y.o.   MRN: [id number]  Hypertension Associated symptoms include neck pain. Pertinent negatives include no chest pain, headaches, palpitations or shortness of breath.  Neck Pain  This is a chronic problem. The current episode started more than 1 year ago. The problem occurs constantly. The problem has been gradually worsening. The pain is associated with nothing. The pain is present in the midline. The quality of the pain is described as aching. The pain is at a severity of 2/10. The pain is mild. The symptoms are aggravated by position. The pain is worse during the day. Pertinent negatives include no chest pain, fever, headaches, leg pain, numbness, pain with swallowing, paresis, photophobia, syncope, tingling, trouble swallowing, visual change, weakness or weight loss. She has tried acetaminophen, home exercises, NSAIDs, oral narcotics and muscle relaxants for the symptoms. The treatment provided mild relief.      Review of Systems  Constitutional: Negative.  Negative for fever, chills, weight loss, diaphoresis, appetite change and fatigue.  HENT: Negative.  Negative for trouble swallowing.   Eyes: Negative.  Negative for photophobia.  Respiratory: Negative.  Negative for cough, choking, chest tightness, shortness of breath and stridor.   Cardiovascular: Negative.  Negative for chest pain, palpitations, leg swelling and syncope.  Gastrointestinal: Negative.  Negative for nausea, vomiting, abdominal pain, diarrhea and constipation.  Endocrine: Negative.   Genitourinary: Negative.   Musculoskeletal: Positive for arthralgias and neck pain. Negative for myalgias and back pain.  Skin: Negative.   Neurological: Negative.  Negative for tingling, weakness, numbness and headaches.  Hematological: Negative.  Negative for adenopathy. Does not bruise/bleed easily.  Psychiatric/Behavioral: Negative.        Objective:   Physical Exam  Constitutional: She is oriented to person, place, and time. She appears well-developed and well-nourished. No distress.  HENT:  Head: Normocephalic and atraumatic.  Mouth/Throat: Oropharynx is clear and moist. No oropharyngeal exudate.  Eyes: Conjunctivae are normal. Right eye exhibits no discharge. Left eye exhibits no discharge. No scleral icterus.  Neck: Normal range of motion. Neck supple. No JVD present. No tracheal deviation present. No thyromegaly present.  Cardiovascular: Normal rate, regular rhythm, normal heart sounds and intact distal pulses.  Exam reveals no gallop and no friction rub.   No murmur heard. Pulmonary/Chest: Effort normal and breath sounds normal. No stridor. No respiratory distress. She has no wheezes. She has no rales. She exhibits no tenderness.  Abdominal: Soft. Bowel sounds are normal. She exhibits no distension and no mass. There is no tenderness. There is no rebound and no guarding.  Musculoskeletal: Normal range of motion. She exhibits no edema or tenderness.       Cervical back: Normal. She exhibits normal range of motion, no tenderness, no bony tenderness, no swelling, no edema, no deformity, no laceration, no pain, no spasm and normal pulse.  Lymphadenopathy:    She has no cervical adenopathy.  Neurological: She is alert and oriented to person, place, and time. She has normal strength. She displays no atrophy, no tremor and normal reflexes. No cranial nerve deficit or sensory deficit. She exhibits normal muscle tone. She displays a negative Romberg sign. She displays no seizure activity. Coordination and gait normal.  Reflex Scores:      Tricep reflexes are 0 on the right side and 0 on the left side.      Bicep reflexes are 0 on the right side and 0 on the  left side.      Brachioradialis reflexes are 1+ on the right side and 1+ on the left side.      Patellar reflexes are 1+ on the right side and 1+ on the left side.      Achilles reflexes are  1+ on the right side and 1+ on the left side. Skin: Skin is warm and dry. No rash noted. She is not diaphoretic. No erythema. No pallor.  Vitals reviewed.   Dg Cervical Spine Complete  10/04/2014   CLINICAL DATA:  Chronic right posterior neck pain. Limited range of motion in right arm. No known injury. Both  EXAM: CERVICAL SPINE  4+ VIEWS  COMPARISON:  None.  FINDINGS: Normal alignment. No fracture. Early bilateral facet disease causing mild neural foraminal narrowing on the right at C4-5 and on the left at C3-4. Disc spaces are maintained. Prevertebral soft tissues are normal.  IMPRESSION: No acute bony abnormality.  Early spondylosis as above.   Electronically Signed   By: Rolm Baptise M.D.   On: 10/04/2014 11:28   Dg Knee Complete 4 Views Right  09/20/2014   CLINICAL DATA:  Right knee pain and swelling for 4 weeks.  EXAM: RIGHT KNEE - COMPLETE 4+ VIEW  COMPARISON:  03/03/2013  FINDINGS: The joint spaces are maintained. The patellar fracture has healed completely. There may be some bony overgrowth at the fracture site. No acute bony findings or osteochondral lesion. No joint effusion.  IMPRESSION: Heel patellar fracture with possible bony overgrowth along the medial patellar facet.  No acute bony findings or joint effusion.   Electronically Signed   By: Marijo Sanes M.D.   On: 09/20/2014 14:31        Assessment & Plan:

## 2014-10-04 NOTE — Patient Instructions (Signed)

## 2014-10-06 ENCOUNTER — Other Ambulatory Visit (HOSPITAL_COMMUNITY): Payer: Self-pay

## 2014-10-11 ENCOUNTER — Ambulatory Visit (HOSPITAL_COMMUNITY): Payer: 59 | Attending: Internal Medicine

## 2014-10-11 ENCOUNTER — Ambulatory Visit (INDEPENDENT_AMBULATORY_CARE_PROVIDER_SITE_OTHER): Payer: 59 | Admitting: Family Medicine

## 2014-10-11 ENCOUNTER — Encounter: Payer: Self-pay | Admitting: Family Medicine

## 2014-10-11 ENCOUNTER — Ambulatory Visit (INDEPENDENT_AMBULATORY_CARE_PROVIDER_SITE_OTHER): Payer: 59

## 2014-10-11 ENCOUNTER — Ambulatory Visit (INDEPENDENT_AMBULATORY_CARE_PROVIDER_SITE_OTHER)
Admission: RE | Admit: 2014-10-11 | Discharge: 2014-10-11 | Disposition: A | Discharge status: Home / Self Care | Payer: 59 | Source: Ambulatory Visit | Attending: Family Medicine | Admitting: Family Medicine

## 2014-10-11 ENCOUNTER — Other Ambulatory Visit: Payer: Self-pay

## 2014-10-11 VITALS — BP 138/84 | HR 69 | Ht 66.0 in | Wt 215.0 lb

## 2014-10-11 DIAGNOSIS — M25511 Pain in right shoulder: Secondary | ICD-10-CM

## 2014-10-11 DIAGNOSIS — M5412 Radiculopathy, cervical region: Secondary | ICD-10-CM | POA: Diagnosis not present

## 2014-10-11 DIAGNOSIS — M75101 Unspecified rotator cuff tear or rupture of right shoulder, not specified as traumatic: Secondary | ICD-10-CM

## 2014-10-11 DIAGNOSIS — E785 Hyperlipidemia, unspecified: Secondary | ICD-10-CM | POA: Insufficient documentation

## 2014-10-11 DIAGNOSIS — R011 Cardiac murmur, unspecified: Secondary | ICD-10-CM

## 2014-10-11 DIAGNOSIS — Z720 Tobacco use: Secondary | ICD-10-CM | POA: Diagnosis not present

## 2014-10-11 DIAGNOSIS — M23306 Other meniscus derangements, unspecified meniscus, right knee: Secondary | ICD-10-CM

## 2014-10-11 DIAGNOSIS — I10 Essential (primary) hypertension: Secondary | ICD-10-CM | POA: Diagnosis not present

## 2014-10-11 MED ORDER — GABAPENTIN 100 MG PO CAPS: 100.0000 mg | ORAL_CAPSULE | Freq: Every day | ORAL | Status: DC

## 2014-10-11 NOTE — Assessment & Plan Note (Signed)
We'll monitor patient placed on gabapentin

## 2014-10-11 NOTE — Progress Notes (Signed)
Pre visit review using our clinic review tool, if applicable. No additional management support is needed unless otherwise documented below in the visit note. 

## 2014-10-11 NOTE — Patient Instructions (Addendum)
I am glad the knee is better the shoulder is a rotator cuff tear We tried an injection today to see if it helps Xray downstairs today of the shoulder Gabapentin 182m at night Ice 10 minutes 2-3 times a day Exercises 3 times a week.  See me again in 4 weeks.

## 2014-10-11 NOTE — Progress Notes (Signed)
[name] [address] [place], [place] [address] Phone: [phone] Subjective:    I'm seeing this patient by the request  of:  [name], MD   CC: Right knee pain and arthralgia  [id number] Kimberly Pierce is a 59 y.o. female coming in with complaint of bilateral knee pain right greater than left. Patient states that her right knee is giving her significant more pain then she's had previously. Patient states that she is doing much better overall with right knee pain. Still gets her some mild discomfort in a sharp sensation with certain movements but is feeling much better.  She is complaining of a new problem. Patient is having more neck pain as well as right shoulder pain. Patient states that in the shoulder that started first. Patient states that she has noticed weakness of the right arm as well. Having difficulty raising it above her head. Patient states that certain movements and become very difficult as well as. Patient denies any significant radiation down the arm but can have some pain in her bicep. Patient states that the pain with her up at night as well. Patient was seen previously by another provider in the emergency department and patient's x-rays of the cervical spine were reviewed by me showing only some mild neuronal foraminal narrowing of the right side at C4-C5.    Past medical history, social, surgical and family history all reviewed in electronic medical record.   Review of Systems: No headache, visual changes, nausea, vomiting, diarrhea, constipation, dizziness, abdominal pain, skin rash, fevers, chills, night sweats, weight loss, swollen lymph nodes, body aches, joint swelling, muscle aches, chest pain, shortness of breath, mood changes.   Objective Blood pressure 138/84, pulse 69, height 5' 6"  (1.676 m), weight 215 lb (97.523 kg), SpO2 98 %.  General: No apparent distress alert and oriented x3 mood and affect normal, dressed  appropriately.  HEENT: Pupils equal, extraocular movements intact  Respiratory: Patient's speak in full sentences and does not appear short of breath  Cardiovascular: No lower extremity edema, non tender, no erythema  Skin: Warm dry intact with no signs of infection or rash on extremities or on axial skeleton.  Abdomen: Soft nontender  Neuro: Cranial nerves II through XII are intact, neurovascularly intact in all extremities with 2+ DTRs and 2+ pulses.  Lymph: No lymphadenopathy of posterior or anterior cervical chain or axillae bilaterally.  Gait normal with good balance and coordination.  MSK:  Non tender with full range of motion and good stability and symmetric strength and tone of shoulders, elbows, wrist, hip, and ankles bilaterally.  Knee: Right Normal to inspection with no erythema or effusion or obvious bony abnormalities. Tender to palpation over the right medial joint line ROM full in flexion and extension and lower leg rotation. Ligaments with solid consistent endpoints including ACL, PCL, LCL, MCL. Still mildly positive Mcmurray's, Apley's, and Thessalonian tests.  painful patellar compression. Patellar glide with moderate crepitus. Patellar and quadriceps tendons unremarkable. Hamstring and quadriceps strength is normal.  Contralateral knee minorly tender over the medial joint line  Neck: Inspection unremarkable. No palpable stepoffs. Mild positive Spurling's maneuver. Full neck range of motion Grip strength and sensation normal in bilateral hands Strength good C4 to T1 distribution No sensory change to C4 to T1 Negative Hoffman sign bilaterally Reflexes normal  Shoulder: Right Inspection reveals no abnormalities, atrophy or asymmetry. Palpation is normal with no tenderness over AC joint or bicipital groove.  Rotator cuff strength 3/5  compared to 5 out of 5 with the contralateral side Positive impingement Speeds and Yergason's tests normal. No labral pathology  noted with negative Obrien's, negative clunk and good stability. Normal scapular function observed. Positive painful arc and positive drop arm test No apprehension sign    MSK US performed of: Right This study was ordered, performed, and interpreted by Charlann Boxer D.O.  Shoulder:   Supraspinatus:  Patient does have a large full-thickness tear noted of the supraspinatus with 1 cm of retraction. Bursal bulge seen with shoulder abduction on impingement view. Infraspinatus:  Appears normal on long and transverse views. Significant increase in Doppler flow Subscapularis:  Including an intersubstance tearing noted but no retraction.Marland Kitchen Positive bursa Teres Minor:  Appears normal on long and transverse views. AC joint:  Mild arthritis Glenohumeral Joint:  Mild joint effusion Glenoid Labrum:  Intact without visualized tears. Biceps Tendon:  Appears normal on long and transverse views, no fraying of tendon, tendon located in intertubercular groove, no subluxation with shoulder internal or external rotation.  Impression: Severe rotator cuff tear with 1 cm of retraction  Procedure: Real-time Ultrasound Guided Injection of right glenohumeral joint Device: GE Logiq E  Ultrasound guided injection is preferred based studies that show increased duration, increased effect, greater accuracy, decreased procedural pain, increased response rate with ultrasound guided versus blind injection.  Verbal informed consent obtained.  Time-out conducted.  Noted no overlying erythema, induration, or other signs of local infection.  Skin prepped in a sterile fashion.  Local anesthesia: Topical Ethyl chloride.  With sterile technique and under real time ultrasound guidance:  Joint visualized.  23g 1  inch needle inserted posterior approach. Pictures taken for needle placement. Patient did have injection of 2 cc of 1% lidocaine, 2 cc of 0.5% Marcaine, and 1.0 cc of Kenalog 40 mg/dL. Completed without difficulty  Pain  immediately resolved suggesting accurate placement of the medication.  Advised to call if fevers/chills, erythema, induration, drainage, or persistent bleeding.  Images permanently stored and available for review in the ultrasound unit.  Impression: Technically successful ultrasound guided injection.        Impression and Recommendations:     This case required medical decision making of moderate complexity.

## 2014-10-11 NOTE — Assessment & Plan Note (Signed)
We do believe the patient does have a rotator cuff tear and patient's ultrasound does show that there is some retraction. We discussed that the differential does include a cervical radiculopathy with patient having some C4-C5 foraminal narrowing noted but this seems to be mild overall with some very mild nerve root impingement. Patient will be started on gabapentin and continue with conservative therapy for the shoulder including home exercises, icing protocol and patient was given some topical anti-inflammatories. Patient was then come back in 2 weeks. At that time if continuing have pain or worsening weakness we will need to consider an MRI because patient may need surgical intervention.

## 2014-10-11 NOTE — Assessment & Plan Note (Signed)
Improved after injection. 

## 2014-10-25 ENCOUNTER — Ambulatory Visit (INDEPENDENT_AMBULATORY_CARE_PROVIDER_SITE_OTHER): Payer: 59 | Admitting: Family Medicine

## 2014-10-25 ENCOUNTER — Encounter: Payer: Self-pay | Admitting: Family Medicine

## 2014-10-25 ENCOUNTER — Other Ambulatory Visit: Payer: Self-pay | Admitting: Internal Medicine

## 2014-10-25 ENCOUNTER — Other Ambulatory Visit (INDEPENDENT_AMBULATORY_CARE_PROVIDER_SITE_OTHER): Payer: 59

## 2014-10-25 VITALS — BP 120/80 | HR 79 | Ht 66.0 in | Wt 216.0 lb

## 2014-10-25 DIAGNOSIS — M25511 Pain in right shoulder: Secondary | ICD-10-CM

## 2014-10-25 DIAGNOSIS — Z1231 Encounter for screening mammogram for malignant neoplasm of breast: Secondary | ICD-10-CM

## 2014-10-25 DIAGNOSIS — M75101 Unspecified rotator cuff tear or rupture of right shoulder, not specified as traumatic: Secondary | ICD-10-CM | POA: Diagnosis not present

## 2014-10-25 NOTE — Progress Notes (Signed)
[facility] [address] [place], [place] [address] Phone: [phone] Subjective:     CC: Right shoulder pain follow-up  [id number] Kimberly Pierce is a 59 y.o. female coming in with complaint of right shoulder pain. Patient was seen previously and did have a rotator cuff tear with significant weakness. Patient states that she is doing much better after the injection patient has been doing the exercises on her regular basis. Patient states that the shoulder is feeling better than it has in multiple months. States that she's been sleeping comfortably at night. When she wakes up she does have some mild radiation down the arm. Patient states that the neck pain still seems to be somewhat uncomfortable the patient's previous workup of the neck only showed mild osteophytic changes.    Past medical history, social, surgical and family history all reviewed in electronic medical record.   Review of Systems: No headache, visual changes, nausea, vomiting, diarrhea, constipation, dizziness, abdominal pain, skin rash, fevers, chills, night sweats, weight loss, swollen lymph nodes, body aches, joint swelling, muscle aches, chest pain, shortness of breath, mood changes.   Objective Blood pressure 120/80, pulse 79, height 5' 6"  (1.676 m), weight 216 lb (97.977 kg), SpO2 99 %.  General: No apparent distress alert and oriented x3 mood and affect normal, dressed appropriately.  HEENT: Pupils equal, extraocular movements intact  Respiratory: Patient's speak in full sentences and does not appear short of breath  Cardiovascular: No lower extremity edema, non tender, no erythema  Skin: Warm dry intact with no signs of infection or rash on extremities or on axial skeleton.  Abdomen: Soft nontender  Neuro: Cranial nerves II through XII are intact, neurovascularly intact in all extremities with 2+ DTRs and 2+ pulses.  Lymph: No lymphadenopathy of posterior or anterior cervical chain  or axillae bilaterally.  Gait normal with good balance and coordination.  MSK:  Non tender with full range of motion and good stability and symmetric strength and tone of shoulders, elbows, wrist, hip, and ankles bilaterally.  tralateral knee minorly tender over the medial joint line  Neck: Inspection unremarkable. No palpable stepoffs. Mild positive Spurling's maneuver. Full neck range of motion Grip strength and sensation normal in bilateral hands Strength good C4 to T1 distribution No sensory change to C4 to T1 Negative Hoffman sign bilaterally Reflexes normal  Shoulder: Right Inspection reveals no abnormalities, atrophy or asymmetry. Palpation is normal with no tenderness over AC joint or bicipital groove.  Rotator cuff strength 4+/5 compared to 5 out of 5 with the contralateral side this is no significant improvement from previous exam. Positive impingement Speeds and Yergason's tests normal. No labral pathology noted with negative Obrien's, negative clunk and good stability. Normal scapular function observed. Negative painful arc and drop arm test No apprehension sign    MSK US performed of: Right This study was ordered, performed, and interpreted by Charlann Boxer D.O.  Shoulder:   Supraspinatus:  Patient does have a large full-thickness tear but patient does show healing with now only 0.66 cm of retraction. Bursal bulge seen with shoulder abduction on impingement view. Infraspinatus:  Appears normal on long and transverse views. Significant increase in Doppler flow Subscapularis: Continued intersubstance tearing noted but no retraction.Marland Kitchen Positive bursa Teres Minor:  Appears normal on long and transverse views. AC joint:  Mild arthritis Glenohumeral Joint:  Mild joint effusion Glenoid Labrum:  Intact without visualized tears. Biceps Tendon:  Appears normal on long and transverse views, no fraying  of tendon, tendon located in intertubercular groove, no subluxation with  shoulder internal or external rotation.  Impression: Severe rotator cuff tear with 0.66 cm of retraction and showing improvement  P        Impression and Recommendations:     This case required medical decision making of moderate complexity.

## 2014-10-25 NOTE — Assessment & Plan Note (Signed)
Patient is doing remarkably better at this time. Encourage patient to do the icing and home exercises. We discussed which activities to potentially avoid. Patient then is going come back and see me again in 4 weeks. His lungs with continued to see healing we are going to be continuing with conservative therapy. Patient has any worsening of symptoms patient will call back earlier.  Spent  25 minutes with patient face-to-face and had greater than 50% of counseling including as described above in assessment and plan.

## 2014-10-25 NOTE — Patient Instructions (Addendum)
Good to see you Ice is your friend Continue the exercises 3 times a week.  Ok to lift 2# weights Try pennsaid twice daily.  See me again in 4 weeks to make sure healed.

## 2014-10-25 NOTE — Addendum Note (Signed)
Addended by: Janith Lima on: 10/25/2014 09:48 AM   Modules accepted: Miquel Dunn

## 2014-10-25 NOTE — Progress Notes (Signed)
Pre visit review using our clinic review tool, if applicable. No additional management support is needed unless otherwise documented below in the visit note. 

## 2014-11-08 ENCOUNTER — Encounter: Payer: Self-pay | Admitting: Family Medicine

## 2014-11-08 ENCOUNTER — Ambulatory Visit (INDEPENDENT_AMBULATORY_CARE_PROVIDER_SITE_OTHER): Payer: 59 | Admitting: Family Medicine

## 2014-11-08 ENCOUNTER — Other Ambulatory Visit (INDEPENDENT_AMBULATORY_CARE_PROVIDER_SITE_OTHER): Payer: 59

## 2014-11-08 VITALS — BP 132/76 | HR 71 | Wt 217.0 lb

## 2014-11-08 DIAGNOSIS — M7521 Bicipital tendinitis, right shoulder: Secondary | ICD-10-CM | POA: Diagnosis not present

## 2014-11-08 DIAGNOSIS — M75101 Unspecified rotator cuff tear or rupture of right shoulder, not specified as traumatic: Secondary | ICD-10-CM

## 2014-11-08 NOTE — Progress Notes (Signed)
Pre visit review using our clinic review tool, if applicable. No additional management support is needed unless otherwise documented below in the visit note. 

## 2014-11-08 NOTE — Assessment & Plan Note (Signed)
Proving with conservative therapy. Once again we discussed formal physical therapy which patient declined. Patient has the other medications for breakthrough pain. Discuss continuing to increase her activity slowly. Patient will still be put on the lifting limit for now. Patient and will come back in 6 weeks and we will reevaluate one more time.

## 2014-11-08 NOTE — Assessment & Plan Note (Signed)
Patient does have more of a tendinitis that seems to be new. Patient has probably been compensating. We discussed icing regimen and home exercises. We discussed what activities to potentially avoid an as well as try compression sleeve. Patient given a trial topical anti-inflammatory. Patient continues to have difficulty I would consider a ultrasound guided injection if necessary.  Spent  25 minutes with patient face-to-face and had greater than 50% of counseling including as described above in assessment and plan.

## 2014-11-08 NOTE — Progress Notes (Signed)
[facility] [address] [place], [place] [address] Phone: [phone] Subjective:     CC: Right shoulder pain follow-up  [id number] Kimberly Pierce is a 59 y.o. female coming in with complaint of right shoulder pain. Patient was seen previously and did have a rotator cuff tear with significant weakness. Patient states that she is doing much better after the injection patient has been doing the exercises on her regular basis. Patient states that the shoulder is feeling better than it has in multiple months. States that she's been sleeping comfortably at night. Patient overall has noticed that she has increased her strength of the right shoulder is well. Some mild pain and stiffness in the mornings but otherwise throughout the day able to do daily activities without any significant pain.    Past medical history, social, surgical and family history all reviewed in electronic medical record.   Review of Systems: No headache, visual changes, nausea, vomiting, diarrhea, constipation, dizziness, abdominal pain, skin rash, fevers, chills, night sweats, weight loss, swollen lymph nodes, body aches, joint swelling, muscle aches, chest pain, shortness of breath, mood changes.   Objective Blood pressure 132/76, pulse 71, weight 217 lb (98.431 kg), SpO2 99 %.  General: No apparent distress alert and oriented x3 mood and affect normal, dressed appropriately.  HEENT: Pupils equal, extraocular movements intact  Respiratory: Patient's speak in full sentences and does not appear short of breath  Cardiovascular: No lower extremity edema, non tender, no erythema  Skin: Warm dry intact with no signs of infection or rash on extremities or on axial skeleton.  Abdomen: Soft nontender  Neuro: Cranial nerves II through XII are intact, neurovascularly intact in all extremities with 2+ DTRs and 2+ pulses.  Lymph: No lymphadenopathy of posterior or anterior cervical chain or axillae  bilaterally.  Gait normal with good balance and coordination.  MSK:  Non tender with full range of motion and good stability and symmetric strength and tone of shoulders, elbows, wrist, hip, and ankles bilaterally.  tralateral knee minorly tender over the medial joint line  Neck: Inspection unremarkable. No palpable stepoffs. Mild positive Spurling's maneuver. Full neck range of motion Grip strength and sensation normal in bilateral hands Strength good C4 to T1 distribution No sensory change to C4 to T1 Negative Hoffman sign bilaterally Reflexes normal  Shoulder: Right Inspection reveals no abnormalities, atrophy or asymmetry. Palpation is normal with no tenderness over AC joint or bicipital groove.  Rotator cuff strength 4+/5 compared to 5 out of 5 with the contralateral side this is no significant improvement from previous exam. Positive impingement Speeds and Yergason's test positive No labral pathology noted with negative Obrien's, negative clunk and good stability. Normal scapular function observed. Negative painful arc and drop arm test No apprehension sign    MSK US performed of: Right This study was ordered, performed, and interpreted by Charlann Boxer D.O.  Shoulder:   Supraspinatus:  Patient does have a large full-thickness tear but no retraction noted today. Significant decrease in hypoechoic changes as well. Infraspinatus:  Appears normal on long and transverse views. Significant increase in Doppler flow Subscapularis: Continued intersubstance tearing noted but no retraction.Marland Kitchen Positive bursa Teres Minor:  Appears normal on long and transverse views. AC joint:  Mild arthritis Glenohumeral Joint:  Mild joint effusion Glenoid Labrum:  Intact without visualized tears. Biceps Tendon:  Negative in hypoechoic changes of the bicep tendon within the tendon sheath.  Impression: Rotator cuff tear still present but no retraction  which is an improvement do bicep tendinitis      Impression and Recommendations:     This case required medical decision making of moderate complexity.

## 2014-11-08 NOTE — Patient Instructions (Signed)
Keep it up You are doing great Rotator cuff is healing Bicep tendon has inflammation and if not perfect we can try an injection Try compression sleeve from [facility] Ice is your friend Add tylenol 325 mg 3 times a day pennsaid pinkie amount topically 2 times daily as needed.  See me again in 4 weeks.

## 2014-11-13 ENCOUNTER — Ambulatory Visit (HOSPITAL_COMMUNITY)
Admission: RE | Admit: 2014-11-13 | Discharge: 2014-11-13 | Disposition: A | Discharge status: Home / Self Care | Payer: 59 | Source: Ambulatory Visit | Attending: Internal Medicine | Admitting: Internal Medicine

## 2014-11-13 DIAGNOSIS — Z1231 Encounter for screening mammogram for malignant neoplasm of breast: Secondary | ICD-10-CM | POA: Insufficient documentation

## 2014-11-14 LAB — HM MAMMOGRAPHY: HM Mammogram: NORMAL

## 2014-12-06 ENCOUNTER — Other Ambulatory Visit (INDEPENDENT_AMBULATORY_CARE_PROVIDER_SITE_OTHER): Payer: 59

## 2014-12-06 ENCOUNTER — Encounter: Payer: Self-pay | Admitting: Family Medicine

## 2014-12-06 ENCOUNTER — Ambulatory Visit (INDEPENDENT_AMBULATORY_CARE_PROVIDER_SITE_OTHER): Payer: 59 | Admitting: Family Medicine

## 2014-12-06 VITALS — BP 126/82 | HR 70 | Wt 217.0 lb

## 2014-12-06 DIAGNOSIS — M5412 Radiculopathy, cervical region: Secondary | ICD-10-CM

## 2014-12-06 DIAGNOSIS — M75101 Unspecified rotator cuff tear or rupture of right shoulder, not specified as traumatic: Secondary | ICD-10-CM | POA: Diagnosis not present

## 2014-12-06 DIAGNOSIS — M79601 Pain in right arm: Secondary | ICD-10-CM | POA: Diagnosis not present

## 2014-12-06 DIAGNOSIS — M7521 Bicipital tendinitis, right shoulder: Secondary | ICD-10-CM | POA: Diagnosis not present

## 2014-12-06 NOTE — Assessment & Plan Note (Signed)
Patient does have some mild osteophytic changes and I do think that some of patient right shoulder pain is congested noted to her neck pain. We will continue to monitor closely. Patient knows she'll call if any significant worsening of radicular symptoms or weakness occurs.

## 2014-12-06 NOTE — Assessment & Plan Note (Signed)
I would like to see patient back again in approximate 4 weeks. At that time we should do another evaluation of the rotator cuff. I do think that there is no retraction we can continue to monitor this time. Patient is still able to do daily activities. Third in the differential cervical radiculitis is still possible as well and we will need to continue to monitor.

## 2014-12-06 NOTE — Assessment & Plan Note (Signed)
Patient was given an injection today and tolerated the procedure very well. We discussed icing regimen and exercises. We discussed which activities to do as well as a compression sleeve that can be helpful. Patient will continue with conservative therapy and see me again in 3-4 weeks.

## 2014-12-06 NOTE — Progress Notes (Signed)
[name] [address] [place], [place] [address] Phone: [phone] Subjective:     CC: Right shoulder pain follow-up  Kimberly Pierce is a 59 y.o. female coming in with complaint of right shoulder pain. Patient was seen previously and did have a rotator cuff tear with significant weakness.she did respond well to the injection and was improving. Patient was also started on a low dose of gabapentin. Patient states that the topical anti-inflammatory's help was well. Patient was found to have more of a bicep tendinitis and was given different home exercises. Patient states that this part of it has not improved. Patient states he is approximately 10% improvement from this. Denies any numbness or tingling but states that even holding anything in his arm for long amount of time can be painful.   Past medical history, social, surgical and family history all reviewed in electronic medical record.   Review of Systems: No headache, visual changes, nausea, vomiting, diarrhea, constipation, dizziness, abdominal pain, skin rash, fevers, chills, night sweats, weight loss, swollen lymph nodes, body aches, joint swelling, muscle aches, chest pain, shortness of breath, mood changes.   Objective Blood pressure 126/82, pulse 70, weight 217 lb (98.431 kg), SpO2 98 %.  General: No apparent distress alert and oriented x3 mood and affect normal, dressed appropriately.  HEENT: Pupils equal, extraocular movements intact  Respiratory: Patient's speak in full sentences and does not appear short of breath  Cardiovascular: No lower extremity edema, non tender, no erythema  Skin: Warm dry intact with no signs of infection or rash on extremities or on axial skeleton.  Abdomen: Soft nontender  Neuro: Cranial nerves II through XII are intact, neurovascularly intact in all extremities with 2+ DTRs and 2+ pulses.  Lymph: No lymphadenopathy of posterior or anterior cervical chain  or axillae bilaterally.  Gait normal with good balance and coordination.  MSK:  Non tender with full range of motion and good stability and symmetric strength and tone of shoulders, elbows, wrist, hip, and ankles bilaterally.  tralateral knee minorly tender over the medial joint line  Neck: Inspection unremarkable. No palpable stepoffs. Mild positive Spurling's maneuver. Full neck range of motion Grip strength and sensation normal in bilateral hands Strength good C4 to T1 distribution No sensory change to C4 to T1 Negative Hoffman sign bilaterally Reflexes normal  Shoulder: Right Inspection reveals no abnormalities, atrophy or asymmetry. Palpation is normal withMark tenderness over the bicipital groove and previous exam  Rotator cuff strength 4+/5 compared to 5 out of 5 with the contralateral side Positive impingement Speeds and Yergason's test positive No labral pathology noted with negative Obrien's, negative clunk and good stability. Normal scapular function observed. Negative painful arc and drop arm test No apprehension sign    MSK US performed of: Right This study was ordered, performed, and interpreted by Charlann Boxer D.O.  Shoulder:   Supraspinatus:  Patient does have a large full-thickness tear but no retraction noted today.no significant change from previous exam. Infraspinatus:  Appears normal on long and transverse views. Significant increase in Doppler flow Subscapularis: Continued intersubstance tearing noted but no retraction.Marland Kitchen Positive bursa Teres Minor:  Appears normal on long and transverse views. AC joint:  Mild arthritis Glenohumeral Joint:  Mild joint effusion Glenoid Labrum:  Intact without visualized tears. Biceps Tendon:  Worsening hypoechoic changes noted. Impression: Rotator cuff tear still present worsening bicep tendinitis  Procedure: Real-time Ultrasound Guided Injection of right bicep tendon sheath Device: GE Logiq E  Ultrasound guided  injection is preferred based studies that show increased duration, increased effect, greater accuracy, decreased procedural pain, increased response rate, and decreased cost with ultrasound guided versus blind injection.  Verbal informed consent obtained.  Time-out conducted.  Noted no overlying erythema, induration, or other signs of local infection.  Skin prepped in a sterile fashion.  Local anesthesia: Topical Ethyl chloride.  With sterile technique and under real time ultrasound guidance:  21-gauge 2 and she'll patient was injected with 0.5 mL of 0.5% Marcaine and 0.5 mL of Kenalog 58m/dL. Completed without difficulty  Pain immediately resolved suggesting accurate placement of the medication.  Advised to call if fevers/chills, erythema, induration, drainage, or persistent bleeding.  Images permanently stored and available for review in the ultrasound unit.  Impression: Technically successful ultrasound guided injection.     Impression and Recommendations:     This case required medical decision making of moderate complexity.

## 2014-12-06 NOTE — Patient Instructions (Signed)
Good to see you Continue using ice at least twice a day The injection today should help calm things down Compression sleeve to the arm daily can help See me egai nin 4 weeks.

## 2015-01-03 ENCOUNTER — Ambulatory Visit: Payer: Self-pay | Admitting: Family Medicine

## 2015-01-17 ENCOUNTER — Ambulatory Visit: Payer: 59 | Admitting: Family Medicine

## 2015-01-23 ENCOUNTER — Telehealth: Payer: Self-pay | Admitting: Internal Medicine

## 2015-01-23 NOTE — Telephone Encounter (Signed)
Pt called in and wanted    Pager number [phone]

## 2015-02-05 ENCOUNTER — Ambulatory Visit (INDEPENDENT_AMBULATORY_CARE_PROVIDER_SITE_OTHER): Payer: 59 | Admitting: Family Medicine

## 2015-02-05 ENCOUNTER — Encounter: Payer: Self-pay | Admitting: Family Medicine

## 2015-02-05 VITALS — BP 132/80 | HR 76 | Ht 66.0 in | Wt 218.0 lb

## 2015-02-05 DIAGNOSIS — M5412 Radiculopathy, cervical region: Secondary | ICD-10-CM | POA: Diagnosis not present

## 2015-02-05 DIAGNOSIS — M7521 Bicipital tendinitis, right shoulder: Secondary | ICD-10-CM | POA: Diagnosis not present

## 2015-02-05 DIAGNOSIS — M75101 Unspecified rotator cuff tear or rupture of right shoulder, not specified as traumatic: Secondary | ICD-10-CM

## 2015-02-05 NOTE — Assessment & Plan Note (Signed)
Resolve after injection.

## 2015-02-05 NOTE — Progress Notes (Signed)
  [facility], Kimber[facility] Pierce Phone: [phone] Subjective:    CC: Right shoulder pain follow-up  [id number] Kimberly Pierce is a 58 y.o. female coming in with complaint of right shoulder pain. Patient was seen previously and did have a rotator cuff tear with significant weakness.she did respond well to the injection and was improving. Patient was also started on a low dose of gabapentin. Patient states that the topical anti-inflammatory's help was well. Patient unfortunately continued to have more of a bicep tendinitis. Patient was given an injection in the tendon sheath at last exam. Patient was to do conservative therapy as well as get a compression sleeve. Patient states another 85% better. States that she do all activities of daily living. Noticing more strength of the shoulder. Still having some radicular symptoms seeming to go down her arm. Denies of that it is stopping her from any activities. Patient is only doing the exercises intermittently.   Past medical history, social, surgical and family history all reviewed in electronic medical record.   Review of Systems: No headache, visual changes, nausea, vomiting, diarrhea, constipation, dizziness, abdominal pain, skin rash, fevers, chills, night sweats, weight loss, swollen lymph nodes, body aches, joint swelling, muscle aches, chest pain, shortness of breath, mood changes.   Objective Blood pressure 132/80, pulse 76, height 5' 6"  (1.676 m), weight 218 lb (98.884 kg), SpO2 97 %.  General: No apparent distress alert and oriented x3 mood and affect normal, dressed appropriately.  HEENT: Pupils equal, extraocular movements intact  Respiratory: Patient's speak in full sentences and does not appear short of breath  Cardiovascular: No lower extremity edema, non tender, no erythema  Skin: Warm dry intact with no signs of infection or rash on extremities or on axial skeleton.  Abdomen:  Soft nontender  Neuro: Cranial nerves II through XII are intact, neurovascularly intact in all extremities with 2+ DTRs and 2+ pulses.  Lymph: No lymphadenopathy of posterior or anterior cervical chain or axillae bilaterally.  Gait normal with good balance and coordination.  MSK:  Non tender with full range of motion and good stability and symmetric strength and tone of shoulders, elbows, wrist, hip, and ankles bilaterally.  tralateral knee minorly tender over the medial joint line  Neck: Inspection unremarkable. No palpable stepoffs. Mild positive Spurling's maneuver. Full neck range of motion Grip strength and sensation normal in bilateral hands Strength good C4 to T1 distribution No sensory change to C4 to T1 Negative Hoffman sign bilaterally Reflexes normal  Shoulder: Right Inspection reveals no abnormalities, atrophy or asymmetry. Palpation shows no tenderness which is improvement Rotator cuff strength 5/5 compared to 5 out of 5 with the contralateral side which is a vast improvement Positive impingement Speeds and Yergason's test negative No labral pathology noted with negative Obrien's, negative clunk and good stability. Normal scapular function observed. Negative painful arc and drop arm test No apprehension sign      Impression and Recommendations:     This case required medical decision making of moderate complexity.

## 2015-02-05 NOTE — Patient Instructions (Signed)
Good to see you Ice is your friend Compression sleeve is great  Try [facility]  Continue the exercises 3 times a week Try the topical medicine 2 times daily See me again in 6 weeks.

## 2015-02-05 NOTE — Assessment & Plan Note (Signed)
Continued the gabapentin. We may need to discuss possibly further imaging a patient has any difficulty. Patient though is doing very well and will follow-up again in 6 weeks.

## 2015-02-05 NOTE — Progress Notes (Signed)
Pre visit review using our clinic review tool, if applicable. No additional management support is needed unless otherwise documented below in the visit note. 

## 2015-02-05 NOTE — Assessment & Plan Note (Signed)
Patient has made significant improvement at this time. I think that the remaining discomfort is likely secondary to more of a cervical radiculopathy. Patient will continue on the gabapentin at night but we'll not increase it. Patient does have some confusion as well as some low medical literacy makes it difficult to discuss deeper. We discussed again about formal physical therapy which patient declined. Patient will continue the other conservative therapies and see me again in 6 weeks.

## 2015-03-08 ENCOUNTER — Telehealth: Payer: Self-pay | Admitting: Internal Medicine

## 2015-03-08 NOTE — Telephone Encounter (Signed)
Called patient left vm. Received FMLA paperwork. The symptoms are unclear, given the recent visits to dr Tamala Julian. We need to know what this FMLA is being requested for.

## 2015-03-14 NOTE — Telephone Encounter (Signed)
I have not received a response from patient. Mailing FMLA forms to her home address.

## 2015-03-28 ENCOUNTER — Ambulatory Visit: Payer: Self-pay | Admitting: Family Medicine

## 2015-03-28 DIAGNOSIS — Z0289 Encounter for other administrative examinations: Secondary | ICD-10-CM

## 2015-04-11 DIAGNOSIS — Z0279 Encounter for issue of other medical certificate: Secondary | ICD-10-CM

## 2015-04-18 ENCOUNTER — Ambulatory Visit: Payer: Self-pay | Admitting: Family Medicine

## 2015-04-25 ENCOUNTER — Ambulatory Visit: Payer: Self-pay | Admitting: Family Medicine

## 2015-04-25 DIAGNOSIS — Z0289 Encounter for other administrative examinations: Secondary | ICD-10-CM

## 2015-05-02 ENCOUNTER — Ambulatory Visit: Payer: Self-pay | Admitting: Family Medicine

## 2015-05-16 ENCOUNTER — Encounter: Payer: Self-pay | Admitting: Family Medicine

## 2015-05-16 ENCOUNTER — Ambulatory Visit (INDEPENDENT_AMBULATORY_CARE_PROVIDER_SITE_OTHER): Payer: 59 | Admitting: Family Medicine

## 2015-05-16 VITALS — BP 132/86 | HR 76 | Ht 66.0 in | Wt 218.0 lb

## 2015-05-16 DIAGNOSIS — M75101 Unspecified rotator cuff tear or rupture of right shoulder, not specified as traumatic: Secondary | ICD-10-CM

## 2015-05-16 MED ORDER — VITAMIN D (ERGOCALCIFEROL) 1.25 MG (50000 UNIT) PO CAPS: 50000.0000 [IU] | ORAL_CAPSULE | ORAL | Status: DC

## 2015-05-16 NOTE — Patient Instructions (Signed)
Good to see you Ice is your friend Continue the exercises Will continue the vitamin D  See me when you need me.

## 2015-05-16 NOTE — Progress Notes (Signed)
Pre visit review using our clinic review tool, if applicable. No additional management support is needed unless otherwise documented below in the visit note. 

## 2015-05-16 NOTE — Assessment & Plan Note (Signed)
Patient is completely healed at this time. Now 7 months from previous injection. Am very happy with the results. Patient will follow-up as needed.

## 2015-05-16 NOTE — Progress Notes (Signed)
  [name] [address] [place], [place] [address] Phone: [phone] Subjective:    CC: Right shoulder pain follow-up  DJS:HFWYOVZCHY TYNIYA KUYPER is a 59 y.o. female coming in with complaint of right shoulder pain. Patient was seen previously and did have a rotator cuff tear with significant weakness.she did respond well to the injection and was improving. Patient is now multiple months from this. Patient is feeling significantly better. Feels that once weekly vitamin D has helped out significantly better. Patient is very happy with the results. Sleeping comfortably and can do all activities of daily activity.    Past medical history, social, surgical and family history all reviewed in electronic medical record.   Review of Systems: No headache, visual changes, nausea, vomiting, diarrhea, constipation, dizziness, abdominal pain, skin rash, fevers, chills, night sweats, weight loss, swollen lymph nodes, body aches, joint swelling, muscle aches, chest pain, shortness of breath, mood changes.   Objective Blood pressure 132/86, pulse 76, height 5' 6"  (1.676 m), weight 218 lb (98.884 kg), SpO2 98 %.  General: No apparent distress alert and oriented x3 mood and affect normal, dressed appropriately.  HEENT: Pupils equal, extraocular movements intact  Respiratory: Patient's speak in full sentences and does not appear short of breath  Cardiovascular: No lower extremity edema, non tender, no erythema  Skin: Warm dry intact with no signs of infection or rash on extremities or on axial skeleton.  Abdomen: Soft nontender  Neuro: Cranial nerves II through XII are intact, neurovascularly intact in all extremities with 2+ DTRs and 2+ pulses.  Lymph: No lymphadenopathy of posterior or anterior cervical chain or axillae bilaterally.  Gait normal with good balance and coordination.  MSK:  Non tender with full range of motion and good stability and symmetric strength and  tone of shoulders, elbows, wrist, hip, and ankles bilaterally.  tralateral knee minorly tender over the medial joint line  Neck: Inspection unremarkable. No palpable stepoffs. Mild positive Spurling's maneuver. Full neck range of motion Grip strength and sensation normal in bilateral hands Strength good C4 to T1 distribution No sensory change to C4 to T1 Negative Hoffman sign bilaterally Reflexes normal  Shoulder: Right Inspection reveals no abnormalities, atrophy or asymmetry. Palpation shows no tenderness which is improvement Rotator cuff strength 5/5 compared to 5 out of 5 with the contralateral side which is a vast improvement Positive impingement Speeds and Yergason's test negative No labral pathology noted with negative Obrien's, negative clunk and good stability. Normal scapular function observed. Negative painful arc and drop arm test No apprehension sign Contralateral shoulder unremarkable as well.     Impression and Recommendations:     This case required medical decision making of moderate complexity.

## 2015-07-03 ENCOUNTER — Telehealth: Payer: Self-pay

## 2015-07-03 NOTE — Telephone Encounter (Signed)
Yes please

## 2015-07-03 NOTE — Telephone Encounter (Signed)
Patients pharmacy called about medication Azor. They were wanting to know if they can prescribe her the generic brand for this. Please advise.

## 2015-07-12 MED FILL — AMLODIPINE-OLMESARTAN 5-40: 5-40 | 90 days supply | Qty: 90 | Fill #0

## 2015-08-05 IMAGING — CR DG CERVICAL SPINE COMPLETE 4+V
6 series · 6 of 6 positions shown · non-contrast
Comparison: None.

CLINICAL DATA: Chronic right posterior neck pain. Limited range of
motion in right arm. No known injury. Both

EXAM:
CERVICAL SPINE  4+ VIEWS

[view not recorded (1 of 6)]
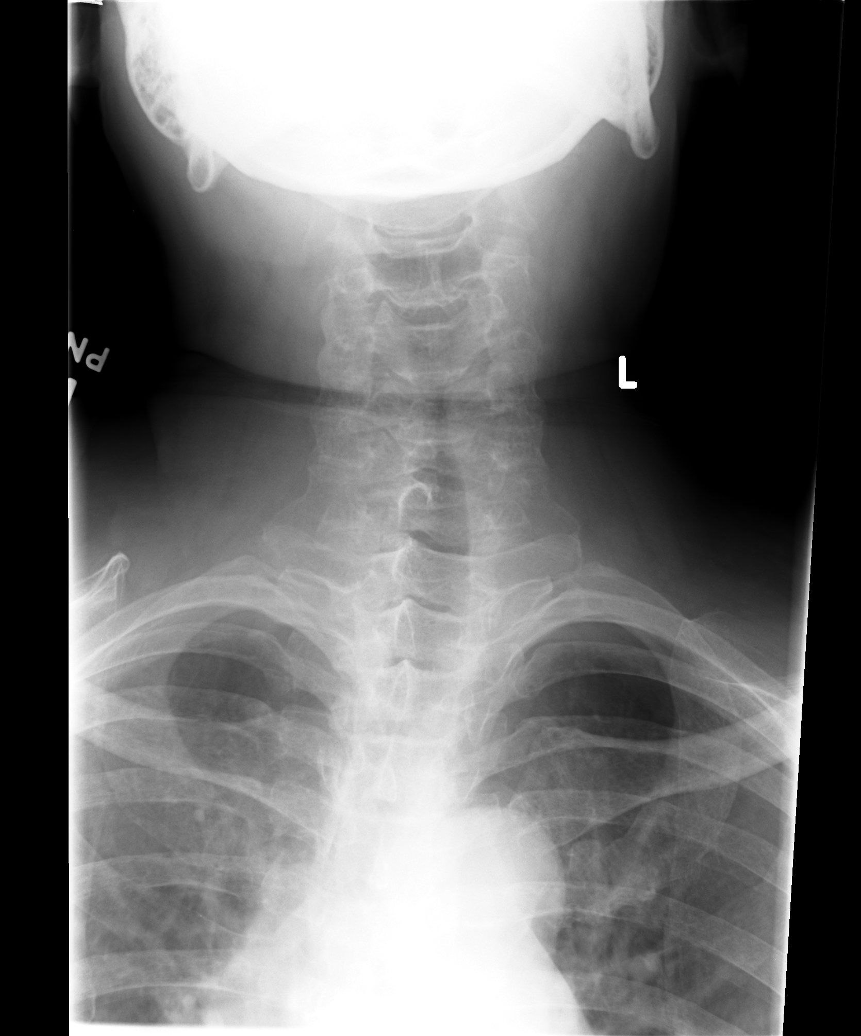

[view not recorded (2 of 6)]
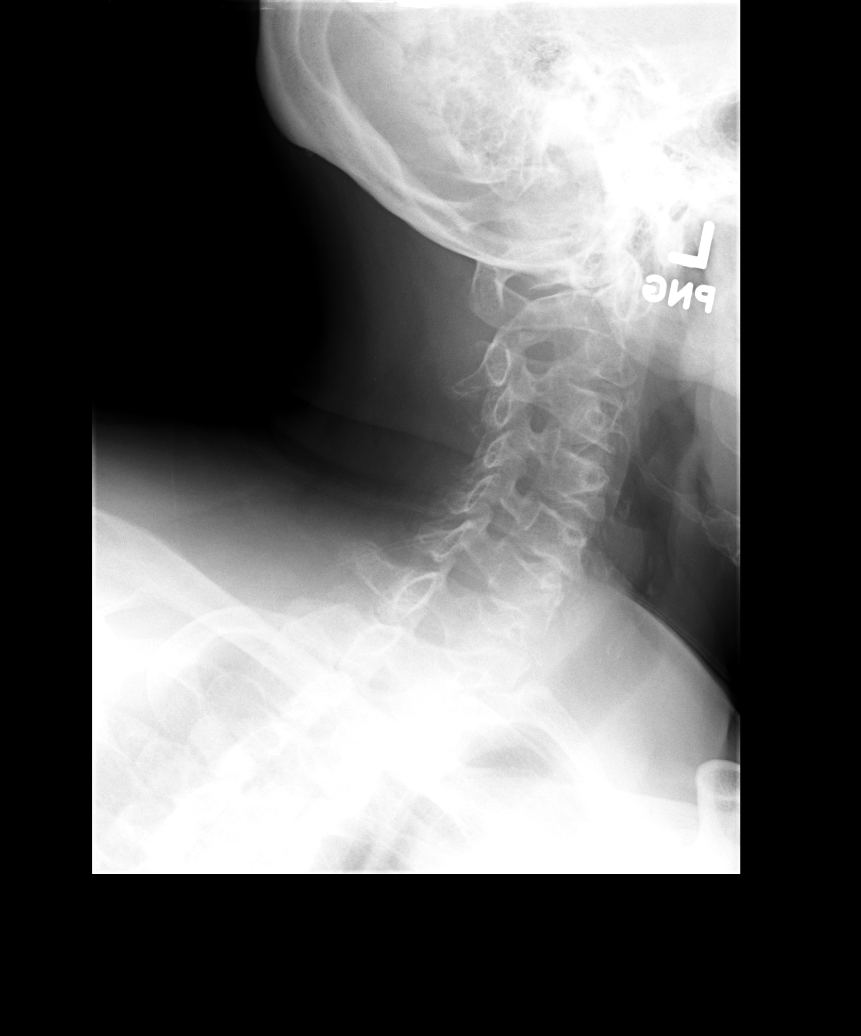

[view not recorded (3 of 6)]
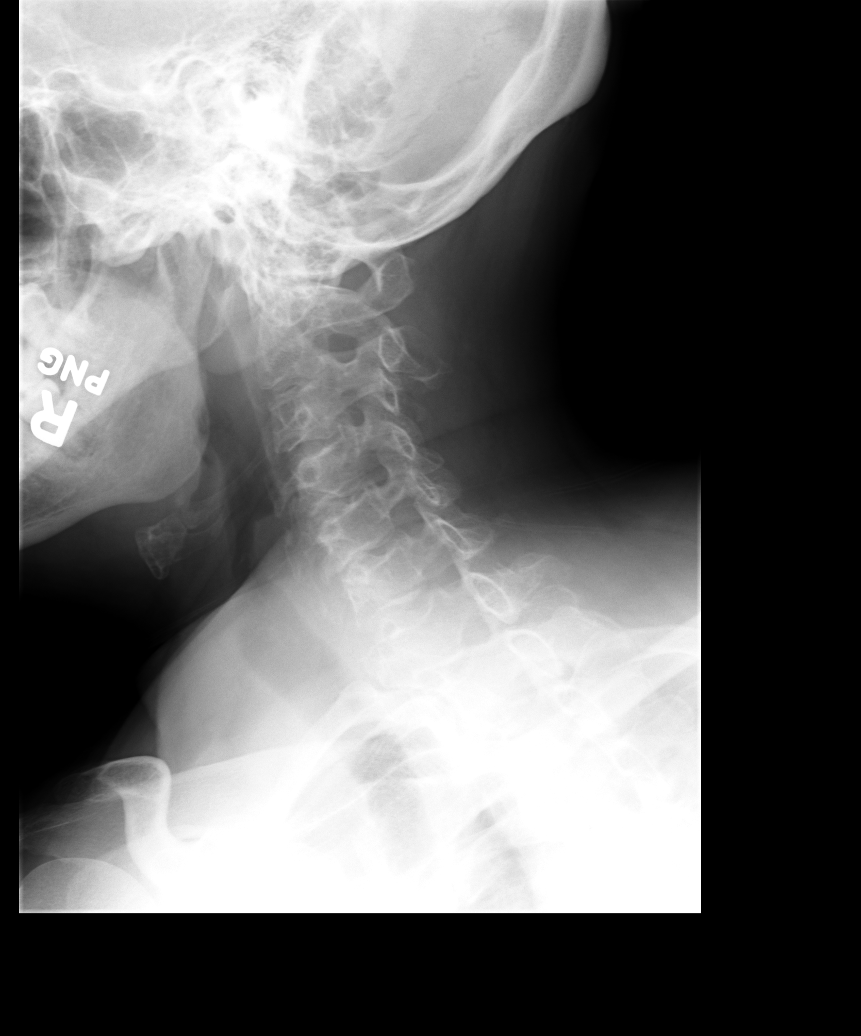

[view not recorded (4 of 6)]
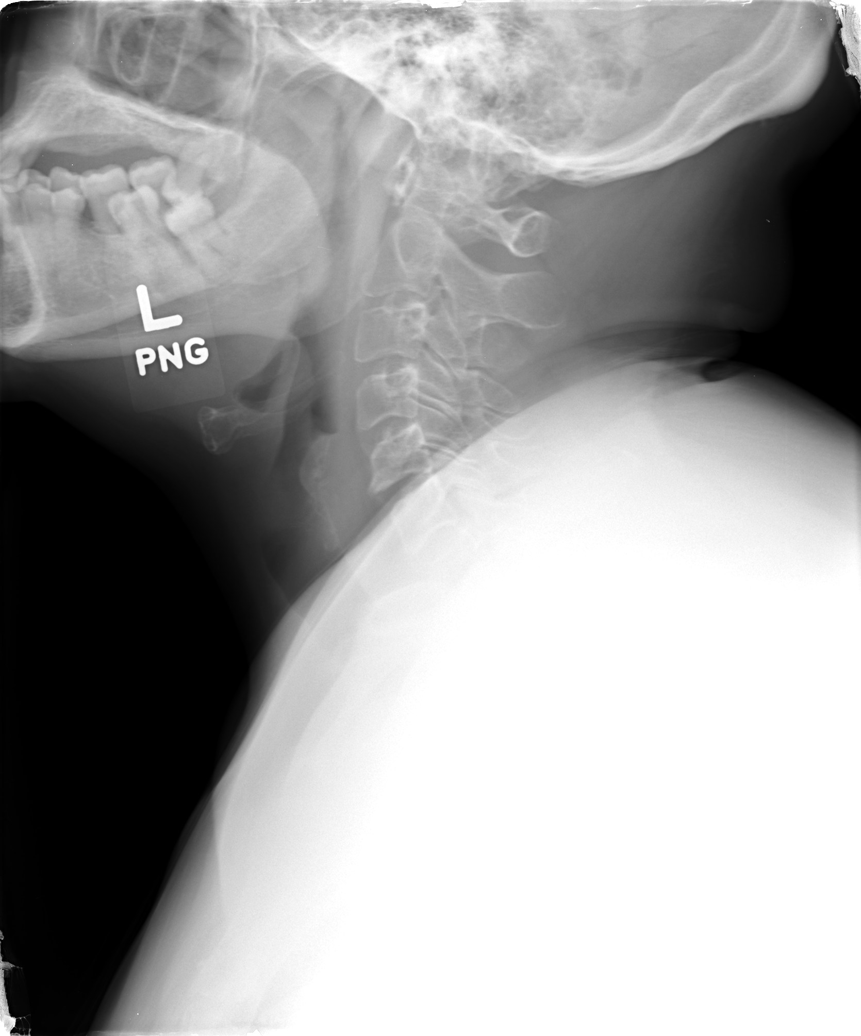

[view not recorded (5 of 6)]
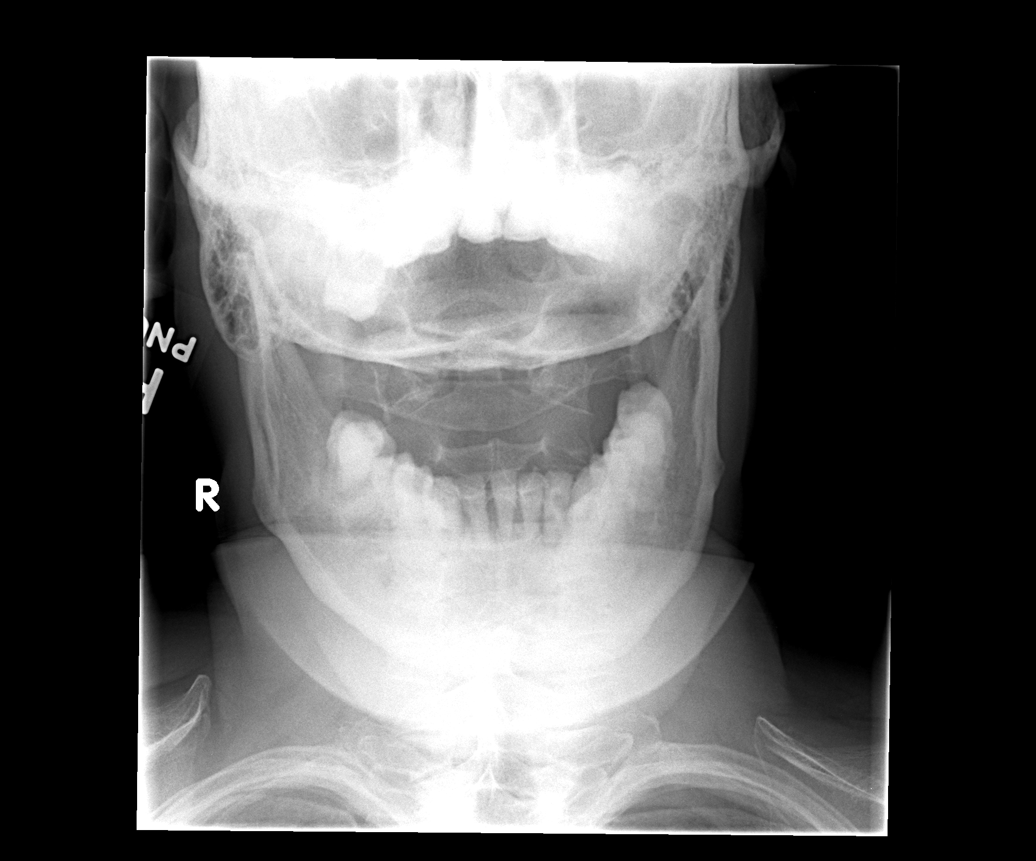

[view not recorded (6 of 6)]
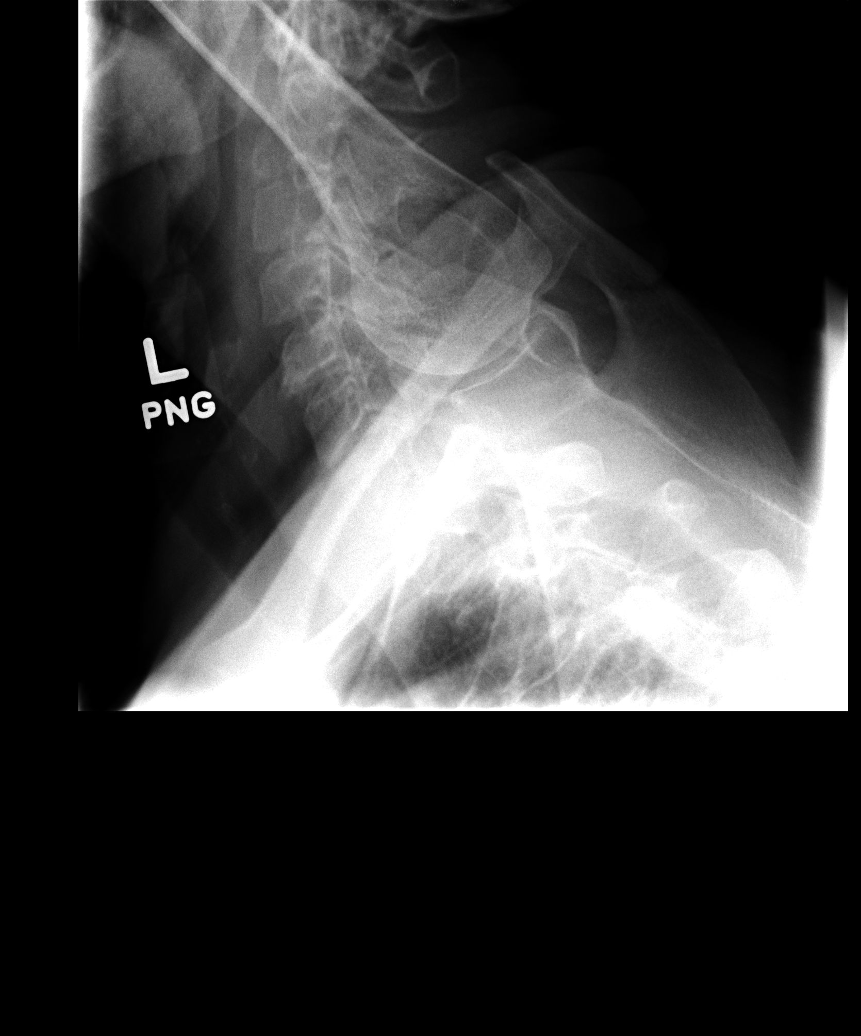

[6 of 6 positions shown; findings below may reference images not displayed]

FINDINGS: Normal alignment. No fracture. Early bilateral facet disease causing
mild neural foraminal narrowing on the right at C4-5 and on the left
at C3-4. Disc spaces are maintained. Prevertebral soft tissues are
normal.
IMPRESSION: No acute bony abnormality.  Early spondylosis as above.

## 2015-08-07 MED FILL — VIT D2 1.25 MG (50,000 UNIT: 1.25 MG | 84 days supply | Qty: 12 | Fill #1

## 2015-09-19 ENCOUNTER — Other Ambulatory Visit: Payer: Self-pay | Admitting: Family Medicine

## 2015-09-27 ENCOUNTER — Other Ambulatory Visit: Payer: Self-pay | Admitting: Internal Medicine

## 2015-10-01 MED FILL — VIT D2 1.25 MG (50,000 UNIT: 1.25 MG | 84 days supply | Qty: 12 | Fill #0

## 2015-11-08 MED FILL — AMLODIPINE-OLMESARTAN 5-40: 5-40 | 90 days supply | Qty: 90 | Fill #0

## 2015-11-13 ENCOUNTER — Other Ambulatory Visit: Payer: Self-pay | Admitting: Internal Medicine

## 2015-11-13 DIAGNOSIS — Z1231 Encounter for screening mammogram for malignant neoplasm of breast: Secondary | ICD-10-CM

## 2015-12-19 ENCOUNTER — Ambulatory Visit: Payer: Self-pay

## 2015-12-20 MED FILL — VIT D2 1.25 MG (50,000 UNIT: 1.25 MG | 84 days supply | Qty: 12 | Fill #1

## 2016-01-30 ENCOUNTER — Ambulatory Visit: Payer: Self-pay

## 2016-02-01 ENCOUNTER — Other Ambulatory Visit: Payer: Self-pay | Admitting: Internal Medicine

## 2016-02-01 ENCOUNTER — Other Ambulatory Visit: Payer: Self-pay | Admitting: Family Medicine

## 2016-02-01 NOTE — Telephone Encounter (Signed)
Refill done.  

## 2016-02-21 ENCOUNTER — Ambulatory Visit
Admission: RE | Admit: 2016-02-21 | Discharge: 2016-02-21 | Disposition: A | Discharge status: Home / Self Care | Payer: 59 | Source: Ambulatory Visit | Attending: Internal Medicine | Admitting: Internal Medicine

## 2016-02-21 DIAGNOSIS — Z1231 Encounter for screening mammogram for malignant neoplasm of breast: Secondary | ICD-10-CM

## 2016-03-03 LAB — HM MAMMOGRAPHY

## 2016-04-24 MED FILL — VIT D2 1.25 MG (50,000 UNIT: 1.25 MG | 84 days supply | Qty: 12 | Fill #0

## 2016-04-28 ENCOUNTER — Ambulatory Visit: Payer: Self-pay | Admitting: Internal Medicine

## 2016-04-28 ENCOUNTER — Telehealth: Payer: Self-pay | Admitting: Internal Medicine

## 2016-04-28 DIAGNOSIS — Z0289 Encounter for other administrative examinations: Secondary | ICD-10-CM

## 2016-04-28 NOTE — Telephone Encounter (Signed)
Patient called to advise that she can not come in today because she got held up. Made an appt for next wednesday, but is asking if you still have samples of her bp medication until then.

## 2016-04-28 NOTE — Telephone Encounter (Signed)
bp med samples: AZOR

## 2016-05-07 ENCOUNTER — Ambulatory Visit (INDEPENDENT_AMBULATORY_CARE_PROVIDER_SITE_OTHER): Payer: 59 | Admitting: Internal Medicine

## 2016-05-07 ENCOUNTER — Other Ambulatory Visit (INDEPENDENT_AMBULATORY_CARE_PROVIDER_SITE_OTHER): Payer: 59

## 2016-05-07 ENCOUNTER — Encounter: Payer: Self-pay | Admitting: Internal Medicine

## 2016-05-07 VITALS — BP 236/110 | HR 89 | Temp 98.5°F | Ht 66.0 in | Wt 211.1 lb

## 2016-05-07 DIAGNOSIS — D539 Nutritional anemia, unspecified: Secondary | ICD-10-CM

## 2016-05-07 DIAGNOSIS — E785 Hyperlipidemia, unspecified: Secondary | ICD-10-CM

## 2016-05-07 DIAGNOSIS — R739 Hyperglycemia, unspecified: Secondary | ICD-10-CM

## 2016-05-07 DIAGNOSIS — I10 Essential (primary) hypertension: Secondary | ICD-10-CM

## 2016-05-07 DIAGNOSIS — R7989 Other specified abnormal findings of blood chemistry: Secondary | ICD-10-CM

## 2016-05-07 DIAGNOSIS — Z0000 Encounter for general adult medical examination without abnormal findings: Secondary | ICD-10-CM

## 2016-05-07 LAB — CBC WITH DIFFERENTIAL/PLATELET
BASOS ABS: 0 10*3/uL (ref 0.0–0.1)
Basophils Relative: 0.6 % (ref 0.0–3.0)
Eosinophils Absolute: 0.2 10*3/uL (ref 0.0–0.7)
Eosinophils Relative: 3.6 % (ref 0.0–5.0)
HCT: 39.3 % (ref 36.0–46.0)
Hemoglobin: 13.2 g/dL (ref 12.0–15.0)
LYMPHS ABS: 2.3 10*3/uL (ref 0.7–4.0)
Lymphocytes Relative: 33.7 % (ref 12.0–46.0)
MCHC: 33.4 g/dL (ref 30.0–36.0)
MCV: 94.6 fl (ref 78.0–100.0)
MONO ABS: 0.6 10*3/uL (ref 0.1–1.0)
Monocytes Relative: 9.1 % (ref 3.0–12.0)
NEUTROS ABS: 3.6 10*3/uL (ref 1.4–7.7)
NEUTROS PCT: 53 % (ref 43.0–77.0)
PLATELETS: 242 10*3/uL (ref 150.0–400.0)
RBC: 4.16 Mil/uL (ref 3.87–5.11)
RDW: 13.2 % (ref 11.5–15.5)
WBC: 6.8 10*3/uL (ref 4.0–10.5)

## 2016-05-07 LAB — VITAMIN B12: VITAMIN B 12: 349 pg/mL (ref 211–911)

## 2016-05-07 LAB — LIPID PANEL
CHOLESTEROL: 188 mg/dL (ref 0–200)
HDL: 56.8 mg/dL (ref >39.00)
LDL Cholesterol: 113 mg/dL — ABNORMAL HIGH (ref 0–99)
NonHDL: 131.29
Total CHOL/HDL Ratio: 3
Triglycerides: 90 mg/dL (ref 0.0–149.0)
VLDL: 18 mg/dL (ref 0.0–40.0)

## 2016-05-07 LAB — URINALYSIS, ROUTINE W REFLEX MICROSCOPIC
BILIRUBIN URINE: NEGATIVE
Hgb urine dipstick: NEGATIVE
KETONES UR: NEGATIVE
Leukocytes, UA: NEGATIVE
Nitrite: NEGATIVE
PH: 5.5 (ref 5.0–8.0)
Specific Gravity, Urine: >=1.03 — AB (ref 1.000–1.030)
Total Protein, Urine: NEGATIVE
UROBILINOGEN UA: 0.2 (ref 0.0–1.0)
Urine Glucose: NEGATIVE

## 2016-05-07 LAB — TSH: TSH: 0.9 u[IU]/mL (ref 0.35–4.50)

## 2016-05-07 LAB — IBC PANEL
IRON: 196 ug/dL — AB (ref 42–145)
Saturation Ratios: 63.6 % — ABNORMAL HIGH (ref 20.0–50.0)
Transferrin: 220 mg/dL (ref 212.0–360.0)

## 2016-05-07 LAB — FOLATE: FOLATE: 9 ng/mL (ref >5.9)

## 2016-05-07 LAB — HEMOGLOBIN A1C: Hgb A1c MFr Bld: 5.9 % (ref 4.6–6.5)

## 2016-05-07 LAB — FERRITIN: FERRITIN: 435.5 ng/mL — AB (ref 10.0–291.0)

## 2016-05-07 MED ORDER — TELMISARTAN 80 MG PO TABS: 80.0000 mg | ORAL_TABLET | Freq: Every day | ORAL | 1 refills | Status: DC

## 2016-05-07 MED ORDER — AMLODIPINE BESYLATE 10 MG PO TABS: 10.0000 mg | ORAL_TABLET | Freq: Every day | ORAL | 3 refills | Status: DC

## 2016-05-07 MED ORDER — CHLORTHALIDONE 25 MG PO TABS: 25.0000 mg | ORAL_TABLET | Freq: Every day | ORAL | 1 refills | Status: DC

## 2016-05-07 MED FILL — AMLODIPINE BESYLATE 10 MG T: 10 | 90 days supply | Qty: 90 | Fill #0

## 2016-05-07 MED FILL — CHLORTHALIDONE 25 MG TABLET: 25 | 90 days supply | Qty: 90 | Fill #0

## 2016-05-07 MED FILL — TELMISARTAN 80 MG TABLET: 80 | 90 days supply | Qty: 90 | Fill #0

## 2016-05-07 NOTE — Progress Notes (Signed)
Subjective:  Patient ID: Kimberly Pierce, female    DOB: 09-16-1955  Age: 60 y.o. MRN: [id number]  CC: Hypertension and Diabetes   HPI Kimberly Pierce presents for a blood pressure check and follow-up on diabetes and history of anemia. She feels well today and offers no complaints. She ran out of her high antihypertensives a couple weeks ago and since then her BP has not been well controlled. She denies headache/blurred vision/chest pain/shortness of breath/palpitations/edema/fatigue.  She denies any sources of blood loss and denies shortness of breath, fatigue, or paresthesias.  She tells me her blood sugar has been well controlled and she denies polys or visual disturbance.   Outpatient Medications Prior to Visit  Medication Sig Dispense Refill  . aspirin EC 81 MG tablet Take 1 tablet (81 mg total) by mouth daily. 90 tablet 11  . gabapentin (NEURONTIN) 100 MG capsule Take 1 capsule (100 mg total) by mouth at bedtime. 30 capsule 3  . loratadine (CLARITIN) 10 MG tablet Take 10 mg by mouth daily as needed.      . traMADol (ULTRAM) 50 MG tablet Take 1 tablet (50 mg total) by mouth every 12 (twelve) hours as needed. 60 tablet 3  . Vitamin D, Ergocalciferol, (DRISDOL) 50000 units CAPS capsule TAKE 1 CAPSULE BY MOUTH EVERY 7 DAYS 12 capsule 1  . amLODipine-olmesartan (AZOR) 5-40 MG tablet TAKE 1 TABLET BY MOUTH ONCE DAILY 90 tablet 0  . chlorthalidone (HYGROTON) 25 MG tablet Take 1 tablet (25 mg total) by mouth daily. 90 tablet 1  . ibuprofen (ADVIL,MOTRIN) 200 MG tablet Take 800 mg by mouth every 6 (six) hours as needed for mild pain or moderate pain.    . methocarbamol (ROBAXIN) 500 MG tablet Take 1 tablet (500 mg total) by mouth 2 (two) times daily. 20 tablet 0  . Tetrahydrozoline HCl (VISINE OP) Place 2 drops into both eyes daily.     No facility-administered medications prior to visit.     ROS Review of Systems  Constitutional: Negative for activity change, appetite change,  diaphoresis, fatigue and unexpected weight change.  HENT: Negative.   Eyes: Negative for visual disturbance.  Respiratory: Negative for cough, choking, chest tightness, shortness of breath and stridor.   Cardiovascular: Negative for chest pain, palpitations and leg swelling.  Gastrointestinal: Negative for abdominal pain, anal bleeding, blood in stool, diarrhea and nausea.  Endocrine: Negative.   Genitourinary: Negative.  Negative for difficulty urinating, dysuria and urgency.  Musculoskeletal: Negative.  Negative for back pain, myalgias and neck pain.  Skin: Negative.  Negative for pallor and rash.  Neurological: Negative.  Negative for dizziness, weakness, light-headedness and numbness.  Hematological: Negative for adenopathy. Does not bruise/bleed easily.  Psychiatric/Behavioral: Negative.     Objective:  BP (!) 236/110 (BP Location: Left Arm, Patient Position: Sitting, Cuff Size: Normal)   Pulse 89   Temp 98.5 F (36.9 C) (Oral)   Ht 5' 6"  (1.676 m)   Wt 211 lb 2 oz (95.8 kg)   SpO2 95%   BMI 34.08 kg/m   BP Readings from Last 3 Encounters:  05/07/16 (!) 236/110  05/16/15 132/86  02/05/15 132/80    Wt Readings from Last 3 Encounters:  05/07/16 211 lb 2 oz (95.8 kg)  05/16/15 218 lb (98.9 kg)  02/05/15 218 lb (98.9 kg)    Physical Exam  Constitutional: She is oriented to person, place, and time. No distress.  HENT:  Mouth/Throat: Oropharynx is clear and moist. No oropharyngeal exudate.  Eyes: Conjunctivae are normal. Right eye exhibits no discharge. Left eye exhibits no discharge. No scleral icterus.  Neck: Normal range of motion. Neck supple. No JVD present. No tracheal deviation present. No thyromegaly present.  Cardiovascular: Normal rate, regular rhythm, normal heart sounds and intact distal pulses.  Exam reveals no gallop and no friction rub.   No murmur heard. EKG ----  Sinus  Rhythm  WITHIN NORMAL LIMITS  Pulmonary/Chest: Effort normal and breath sounds  normal. No stridor. No respiratory distress. She has no wheezes. She has no rales. She exhibits no tenderness.  Abdominal: Soft. Bowel sounds are normal. She exhibits no distension and no mass. There is no tenderness. There is no rebound and no guarding.  Musculoskeletal: Normal range of motion. She exhibits no edema, tenderness or deformity.  Lymphadenopathy:    She has no cervical adenopathy.  Neurological: She is oriented to person, place, and time.  Skin: Skin is warm and dry. No rash noted. She is not diaphoretic. No erythema. No pallor.  Vitals reviewed.   Lab Results  Component Value Date   WBC 6.8 05/07/2016   HGB 13.2 05/07/2016   HCT 39.3 05/07/2016   PLT 242.0 05/07/2016   GLUCOSE 105 (H) 09/11/2014   CHOL 188 05/07/2016   TRIG 90.0 05/07/2016   HDL 56.80 05/07/2016   LDLDIRECT 92.1 03/23/2013   LDLCALC 113 (H) 05/07/2016   ALT 14 09/11/2014   AST 13 09/11/2014   NA 139 09/11/2014   K 3.7 09/11/2014   CL 105 09/11/2014   CREATININE 0.46 09/11/2014   BUN 17 09/11/2014   CO2 27 09/11/2014   TSH 0.90 05/07/2016   HGBA1C 5.9 05/07/2016    Mm Screening Breast Tomo Bilateral  Result Date: 02/26/2016 CLINICAL DATA:  Screening. EXAM: 2D DIGITAL SCREENING BILATERAL MAMMOGRAM WITH CAD AND ADJUNCT TOMO COMPARISON:  Previous exam(s). ACR Breast Density Category b: There are scattered areas of fibroglandular density. FINDINGS: There are no findings suspicious for malignancy. Images were processed with CAD. IMPRESSION: No mammographic evidence of malignancy. A result letter of this screening mammogram will be mailed directly to the patient. RECOMMENDATION: Screening mammogram in one year. (Code:SM-B-01Y) BI-RADS CATEGORY  1: Negative. Electronically Signed   By: Evangeline Dakin M.D.   On: 02/26/2016 08:33    Assessment & Plan:   Kimberly Pierce was seen today for hypertension and diabetes.  Diagnoses and all orders for this visit:  Routine general medical examination at a health care  facility  Essential hypertension- her BP is not controlled, her labs are negative for end organ damage or secondary causes of HTN, will restart the ARB, CCB, thiazide combination -     chlorthalidone (HYGROTON) 25 MG tablet; Take 1 tablet (25 mg total) by mouth daily. -     Urinalysis, Routine w reflex microscopic (not at [facility]); Future -     EKG 12-Lead -     amLODipine (NORVASC) 10 MG tablet; Take 1 tablet (10 mg total) by mouth daily. -     telmisartan (MICARDIS) 80 MG tablet; Take 1 tablet (80 mg total) by mouth daily.  Hyperglycemia- her A1C is 5.9%, she is prediabetic, no meds are needed for this. She will improve her lifestyle modifications -     Hemoglobin A1c; Future  Hyperlipidemia with target LDL less than 130- her Framingham risk was only 5% so I do not recommend that she start taking a statin. -     Lipid panel; Future -     TSH; Future  Deficiency anemia- the anemia has resolved and she is not deficient in B12 or folate. She does have an elevated iron level and ferritin but normal transferrin. I don't think she has hemachromatosis but I've asked her to return for further testing to see if there is some inflammatory process or liver disease that might explain this. -     CBC with Differential/Platelet; Future -     IBC panel; Future -     Vitamin B12; Future -     Folate; Future -     Ferritin; Future  Elevated ferritin- I've asked her to return for further evaluation of this.   I have discontinued Ms. Afzal's ibuprofen, Tetrahydrozoline HCl (VISINE OP), methocarbamol, and amLODipine-olmesartan. I am also having her start on amLODipine and telmisartan. Additionally, I am having her maintain her loratadine, aspirin EC, traMADol, gabapentin, Vitamin D (Ergocalciferol), and chlorthalidone.  Meds ordered this encounter  Medications  . chlorthalidone (HYGROTON) 25 MG tablet    Sig: Take 1 tablet (25 mg total) by mouth daily.    Dispense:  90 tablet    Refill:  1  .  amLODipine (NORVASC) 10 MG tablet    Sig: Take 1 tablet (10 mg total) by mouth daily.    Dispense:  90 tablet    Refill:  3  . telmisartan (MICARDIS) 80 MG tablet    Sig: Take 1 tablet (80 mg total) by mouth daily.    Dispense:  90 tablet    Refill:  1     Follow-up: Return in about 4 weeks (around 06/04/2016).  [name], MD

## 2016-05-07 NOTE — Patient Instructions (Signed)
Hypertension Hypertension, commonly called high blood pressure, is when the force of blood pumping through your arteries is too strong. Your arteries are the blood vessels that carry blood from your heart throughout your body. A blood pressure reading consists of a higher number over a lower number, such as 110/72. The higher number (systolic) is the pressure inside your arteries when your heart pumps. The lower number (diastolic) is the pressure inside your arteries when your heart relaxes. Ideally you want your blood pressure below 120/80. Hypertension forces your heart to work harder to pump blood. Your arteries may become narrow or stiff. Having untreated or uncontrolled hypertension can cause heart attack, stroke, kidney disease, and other problems. What increases the risk? Some risk factors for high blood pressure are controllable. Others are not. Risk factors you cannot control include:  Race. You may be at higher risk if you are African American.  Age. Risk increases with age.  Gender. Men are at higher risk than women before age 45 years. After age 65, women are at higher risk than men. Risk factors you can control include:  Not getting enough exercise or physical activity.  Being overweight.  Getting too much fat, sugar, calories, or salt in your diet.  Drinking too much alcohol. What are the signs or symptoms? Hypertension does not usually cause signs or symptoms. Extremely high blood pressure (hypertensive crisis) may cause headache, anxiety, shortness of breath, and nosebleed. How is this diagnosed? To check if you have hypertension, your health care provider will measure your blood pressure while you are seated, with your arm held at the level of your heart. It should be measured at least twice using the same arm. Certain conditions can cause a difference in blood pressure between your right and left arms. A blood pressure reading that is higher than normal on one occasion does  not mean that you need treatment. If it is not clear whether you have high blood pressure, you may be asked to return on a different day to have your blood pressure checked again. Or, you may be asked to monitor your blood pressure at home for 1 or more weeks. How is this treated? Treating high blood pressure includes making lifestyle changes and possibly taking medicine. Living a healthy lifestyle can help lower high blood pressure. You may need to change some of your habits. Lifestyle changes may include:  Following the DASH diet. This diet is high in fruits, vegetables, and whole grains. It is low in salt, red meat, and added sugars.  Keep your sodium intake below 2,300 mg per day.  Getting at least 30-45 minutes of aerobic exercise at least 4 times per week.  Losing weight if necessary.  Not smoking.  Limiting alcoholic beverages.  Learning ways to reduce stress. Your health care provider may prescribe medicine if lifestyle changes are not enough to get your blood pressure under control, and if one of the following is true:  You are 18-59 years of age and your systolic blood pressure is above 140.  You are 60 years of age or older, and your systolic blood pressure is above 150.  Your diastolic blood pressure is above 90.  You have diabetes, and your systolic blood pressure is over 140 or your diastolic blood pressure is over 90.  You have kidney disease and your blood pressure is above 140/90.  You have heart disease and your blood pressure is above 140/90. Your personal target blood pressure may vary depending on your medical   conditions, your age, and other factors. Follow these instructions at home:  Have your blood pressure rechecked as directed by your health care provider.  Take medicines only as directed by your health care provider. Follow the directions carefully. Blood pressure medicines must be taken as prescribed. The medicine does not work as well when you skip  doses. Skipping doses also puts you at risk for problems.  Do not smoke.  Monitor your blood pressure at home as directed by your health care provider. Contact a health care provider if:  You think you are having a reaction to medicines taken.  You have recurrent headaches or feel dizzy.  You have swelling in your ankles.  You have trouble with your vision. Get help right away if:  You develop a severe headache or confusion.  You have unusual weakness, numbness, or feel faint.  You have severe chest or abdominal pain.  You vomit repeatedly.  You have trouble breathing. This information is not intended to replace advice given to you by your health care provider. Make sure you discuss any questions you have with your health care provider. Document Released: 05/26/2005 Document Revised: 11/01/2015 Document Reviewed: 03/18/2013 Elsevier Interactive Patient Education  2017 [employer]  

## 2016-05-07 NOTE — Progress Notes (Signed)
Pre visit review using our clinic review tool, if applicable. No additional management support is needed unless otherwise documented below in the visit note. 

## 2016-05-08 ENCOUNTER — Encounter: Payer: Self-pay | Admitting: Internal Medicine

## 2016-05-08 DIAGNOSIS — R7989 Other specified abnormal findings of blood chemistry: Secondary | ICD-10-CM | POA: Insufficient documentation

## 2016-05-08 DIAGNOSIS — D539 Nutritional anemia, unspecified: Secondary | ICD-10-CM | POA: Insufficient documentation

## 2016-05-21 ENCOUNTER — Telehealth: Payer: Self-pay | Admitting: Internal Medicine

## 2016-05-21 ENCOUNTER — Other Ambulatory Visit: Payer: Self-pay | Admitting: Internal Medicine

## 2016-05-21 DIAGNOSIS — M5412 Radiculopathy, cervical region: Secondary | ICD-10-CM

## 2016-05-21 DIAGNOSIS — S32010D Wedge compression fracture of first lumbar vertebra, subsequent encounter for fracture with routine healing: Secondary | ICD-10-CM

## 2016-05-21 MED ORDER — CYCLOBENZAPRINE HCL 7.5 MG PO TABS: 7.5000 mg | ORAL_TABLET | Freq: Three times a day (TID) | ORAL | 2 refills | Status: DC | PRN

## 2016-05-21 MED FILL — CYCLOBENZAPRINE 7.5 MG TAB: 7.5 | 30 days supply | Qty: 90 | Fill #0

## 2016-05-21 NOTE — Telephone Encounter (Signed)
Pt called in and said that she was seen last week and her back I still bothering her and wants to know Dr Ronnald Ramp with write her a script for a muscle relaxer ?

## 2016-05-21 NOTE — Telephone Encounter (Signed)
RX sent

## 2016-05-22 NOTE — Telephone Encounter (Signed)
Left detailed message about rx sent to pharmacy

## 2016-06-23 ENCOUNTER — Ambulatory Visit: Payer: 59 | Admitting: Internal Medicine

## 2016-07-21 ENCOUNTER — Ambulatory Visit: Payer: 59 | Admitting: Internal Medicine

## 2016-08-05 MED FILL — VIT D2 1.25 MG (50,000 UNIT: 1.25 MG | 84 days supply | Qty: 12 | Fill #1

## 2016-09-30 ENCOUNTER — Other Ambulatory Visit: Payer: Self-pay | Admitting: Family Medicine

## 2016-11-20 MED FILL — VIT D2 1.25 MG (50,000 UNIT: 1.25 MG | 84 days supply | Qty: 12 | Fill #0

## 2017-01-16 ENCOUNTER — Other Ambulatory Visit: Payer: Self-pay | Admitting: Internal Medicine

## 2017-01-16 DIAGNOSIS — Z1231 Encounter for screening mammogram for malignant neoplasm of breast: Secondary | ICD-10-CM

## 2017-03-02 ENCOUNTER — Ambulatory Visit: Payer: Self-pay

## 2017-03-16 ENCOUNTER — Ambulatory Visit: Payer: Self-pay

## 2017-03-30 ENCOUNTER — Ambulatory Visit: Payer: Self-pay

## 2017-04-16 ENCOUNTER — Other Ambulatory Visit (INDEPENDENT_AMBULATORY_CARE_PROVIDER_SITE_OTHER): Payer: 59

## 2017-04-16 ENCOUNTER — Ambulatory Visit (INDEPENDENT_AMBULATORY_CARE_PROVIDER_SITE_OTHER): Payer: 59 | Admitting: Nurse Practitioner

## 2017-04-16 ENCOUNTER — Encounter: Payer: Self-pay | Admitting: Nurse Practitioner

## 2017-04-16 ENCOUNTER — Ambulatory Visit: Payer: Self-pay

## 2017-04-16 VITALS — BP 216/94 | HR 63 | Temp 97.9°F | Ht 66.0 in | Wt 201.0 lb

## 2017-04-16 DIAGNOSIS — I10 Essential (primary) hypertension: Secondary | ICD-10-CM | POA: Diagnosis not present

## 2017-04-16 DIAGNOSIS — M545 Low back pain: Secondary | ICD-10-CM

## 2017-04-16 DIAGNOSIS — G8929 Other chronic pain: Secondary | ICD-10-CM

## 2017-04-16 DIAGNOSIS — M170 Bilateral primary osteoarthritis of knee: Secondary | ICD-10-CM

## 2017-04-16 DIAGNOSIS — R8290 Unspecified abnormal findings in urine: Secondary | ICD-10-CM | POA: Diagnosis not present

## 2017-04-16 DIAGNOSIS — S32010D Wedge compression fracture of first lumbar vertebra, subsequent encounter for fracture with routine healing: Secondary | ICD-10-CM | POA: Diagnosis not present

## 2017-04-16 DIAGNOSIS — M5412 Radiculopathy, cervical region: Secondary | ICD-10-CM | POA: Diagnosis not present

## 2017-04-16 LAB — URINALYSIS WITH CULTURE, IF INDICATED
Bilirubin Urine: NEGATIVE
HGB URINE DIPSTICK: NEGATIVE
Ketones, ur: NEGATIVE
Leukocytes, UA: NEGATIVE
Nitrite: NEGATIVE
RBC / HPF: NONE SEEN (ref 0–?)
Specific Gravity, Urine: >=1.03 — AB (ref 1.000–1.030)
TOTAL PROTEIN, URINE-UPE24: NEGATIVE
Urine Glucose: NEGATIVE
Urobilinogen, UA: 0.2 (ref 0.0–1.0)
pH: 6 (ref 5.0–8.0)

## 2017-04-16 LAB — BASIC METABOLIC PANEL
BUN: 16 mg/dL (ref 6–23)
CHLORIDE: 107 meq/L (ref 96–112)
CO2: 22 meq/L (ref 19–32)
CREATININE: 0.49 mg/dL (ref 0.40–1.20)
Calcium: 9.9 mg/dL (ref 8.4–10.5)
GFR: 164.44 mL/min (ref >60.00)
Glucose, Bld: 113 mg/dL — ABNORMAL HIGH (ref 70–99)
POTASSIUM: 3.2 meq/L — AB (ref 3.5–5.1)
SODIUM: 136 meq/L (ref 135–145)

## 2017-04-16 MED ORDER — CYCLOBENZAPRINE HCL 7.5 MG PO TABS: 7.5000 mg | ORAL_TABLET | Freq: Every evening | ORAL | 0 refills | Status: DC | PRN

## 2017-04-16 MED ORDER — TRAMADOL HCL 50 MG PO TABS: 50.0000 mg | ORAL_TABLET | Freq: Two times a day (BID) | ORAL | 0 refills | Status: DC | PRN

## 2017-04-16 MED ORDER — AMLODIPINE BESYLATE 10 MG PO TABS: 10.0000 mg | ORAL_TABLET | Freq: Every day | ORAL | 0 refills | Status: DC

## 2017-04-16 MED ORDER — TELMISARTAN 80 MG PO TABS: 80.0000 mg | ORAL_TABLET | Freq: Every day | ORAL | 0 refills | Status: DC

## 2017-04-16 MED FILL — traMADol HCL 50 MG TABS: 50 | 3 days supply | Qty: 6 | Fill #0

## 2017-04-16 MED FILL — TELMISARTAN 80 MG TABS: 80 | 30 days supply | Qty: 30 | Fill #0

## 2017-04-16 MED FILL — AMLODIPINE BESYLATE 10 MG T: 10 | 30 days supply | Qty: 30 | Fill #0

## 2017-04-16 MED FILL — CYCLOBENZAPRINE 7.5 MG TAB: 7.5 | 7 days supply | Qty: 7 | Fill #0

## 2017-04-16 NOTE — Patient Instructions (Addendum)
Urinalysis indicates possible UTI. Oral abx sent. Stable renal function and electrolytes. Mild decrease in potassium which can be replaced with consumption of banana, dark green vegetables, potatoes etc.  Resume BP medications as prescribed.  Follow up with Dr. Ronnald Ramp as scheduled.

## 2017-04-16 NOTE — Progress Notes (Signed)
Subjective:  Patient ID: Kimberly Pierce, female    DOB: 1956/05/14  Age: 61 y.o. MRN: [id number]  CC: Back Pain (lower back pain--on and off--has to lift at work. painful and muscle spasm?) and Medication Refill (BP med refills? last ov with Kimberly Pierce 04/2016)   Back Pain  This is a recurrent problem. The current episode started more than 1 year ago. The problem occurs intermittently. The problem has been waxing and waning since onset. The pain is present in the lumbar spine. The quality of the pain is described as cramping and aching. The pain does not radiate. The symptoms are aggravated by bending, lying down, twisting and position. Stiffness is present all day. Pertinent negatives include no bladder incontinence, bowel incontinence, dysuria, leg pain, paresis, paresthesias, pelvic pain or tingling. Risk factors include history of osteoporosis, lack of exercise, poor posture and menopause. She has tried NSAIDs (and tylenol) for the symptoms. The treatment provided no relief.    Back pain: Acute on chronic. Worse in last 1week yesterday. No improvement with  Tylenol or ibuprofen. 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  HTN: Out of medication since 03/2017. BP Readings from Last 3 Encounters:  04/16/17 (!) 216/94  05/07/16 (!) 236/110  05/16/15 132/86    Outpatient Medications Prior to Visit  Medication Sig Dispense Refill  . aspirin EC 81 MG tablet Take 1 tablet (81 mg total) by mouth daily. 90 tablet 11  . gabapentin (NEURONTIN) 100 MG capsule Take 1 capsule (100 mg total) by mouth at bedtime. 30 capsule 3  . loratadine (CLARITIN) 10 MG tablet Take 10 mg by mouth daily as needed.      . Vitamin D, Ergocalciferol, (DRISDOL) 50000 units CAPS capsule TAKE 1 CAPSULE BY MOUTH EVERY 7 DAYS 12 capsule 1  . amLODipine (NORVASC) 10 MG tablet Take 1 tablet (10 mg total) by mouth daily. 90 tablet 3  . chlorthalidone (HYGROTON) 25 MG tablet Take 1  tablet (25 mg total) by mouth daily. 90 tablet 1  . cyclobenzaprine (FEXMID) 7.5 MG tablet Take 1 tablet (7.5 mg total) by mouth 3 (three) times daily as needed for muscle spasms. 90 tablet 2  . telmisartan (MICARDIS) 80 MG tablet Take 1 tablet (80 mg total) by mouth daily. 90 tablet 1  . traMADol (ULTRAM) 50 MG tablet Take 1 tablet (50 mg total) by mouth every 12 (twelve) hours as needed. 60 tablet 3   No facility-administered medications prior to visit.     ROS See HPI  Objective:  BP (!) 216/94   Pulse 63   Temp 97.9 F (36.6 C)   Ht 5' 6"  (1.676 m)   Wt 201 lb (91.2 kg)   SpO2 100%   BMI 32.44 kg/m   BP Readings from Last 3 Encounters:  04/16/17 (!) 216/94  05/07/16 (!) 236/110  05/16/15 132/86    Wt Readings from Last 3 Encounters:  04/16/17 201 lb (91.2 kg)  05/07/16 211 lb 2 oz (95.8 kg)  05/16/15 218 lb (98.9 kg)   Physical Exam  Constitutional: She is oriented to person, place, and time. No distress.  Cardiovascular: Normal rate and regular rhythm.  Pulmonary/Chest: Effort normal and breath sounds normal.  Musculoskeletal: She exhibits tenderness. She exhibits no edema or deformity.       Lumbar back: She exhibits decreased range of motion, tenderness and pain. She exhibits no bony tenderness.  Neurological: She is alert and oriented to person, place, and time.  Skin: Skin is warm and dry.  Vitals reviewed.  Lab Results  Component Value Date   WBC 6.8 05/07/2016   HGB 13.2 05/07/2016   HCT 39.3 05/07/2016   PLT 242.0 05/07/2016   GLUCOSE 113 (H) 04/16/2017   CHOL 188 05/07/2016   TRIG 90.0 05/07/2016   HDL 56.80 05/07/2016   LDLDIRECT 92.1 03/23/2013   LDLCALC 113 (H) 05/07/2016   ALT 14 09/11/2014   AST 13 09/11/2014   NA 136 04/16/2017   K 3.2 (L) 04/16/2017   CL 107 04/16/2017   CREATININE 0.49 04/16/2017   BUN 16 04/16/2017   CO2 22 04/16/2017   TSH 0.90 05/07/2016   HGBA1C 5.9 05/07/2016    Mm Screening Breast Tomo Bilateral  Result  Date: 02/21/2016 CLINICAL DATA:  Screening. EXAM: 2D DIGITAL SCREENING BILATERAL MAMMOGRAM WITH CAD AND ADJUNCT TOMO COMPARISON:  Previous exam(s). ACR Breast Density Category b: There are scattered areas of fibroglandular density. FINDINGS: There are no findings suspicious for malignancy. Images were processed with CAD. IMPRESSION: No mammographic evidence of malignancy. A result letter of this screening mammogram will be mailed directly to the patient. RECOMMENDATION: Screening mammogram in one year. (Code:SM-B-01Y) BI-RADS CATEGORY  1: Negative. Electronically Signed   By: Evangeline Dakin M.D.   On: 02/26/2016 08:33    Assessment & Plan:   Kimberly Pierce was seen today for back pain and medication refill.  Diagnoses and all orders for this visit:  Essential hypertension -     Basic metabolic panel; Future -     amLODipine (NORVASC) 10 MG tablet; Take 1 tablet (10 mg total) daily by mouth. -     telmisartan (MICARDIS) 80 MG tablet; Take 1 tablet (80 mg total) daily by mouth. -     chlorthalidone (HYGROTON) 25 MG tablet; Take 1 tablet (25 mg total) daily by mouth. -     potassium chloride (K-DUR) 10 MEQ tablet; Take 1 tablet (10 mEq total) daily by mouth.  Chronic bilateral low back pain without sciatica -     Basic metabolic panel; Future -     Urinalysis with Culture, if indicated; Future -     traMADol (ULTRAM) 50 MG tablet; Take 1 tablet (50 mg total) every 12 (twelve) hours as needed by mouth.  Primary osteoarthritis of both knees  Cervical radiculitis  Open compression fracture of L1 lumbar vertebra with routine healing, subsequent encounter -     cyclobenzaprine (FEXMID) 7.5 MG tablet; Take 1 tablet (7.5 mg total) at bedtime as needed by mouth for muscle spasms.  Abnormal urinalysis -     ciprofloxacin (CIPRO) 250 MG tablet; Take 1 tablet (250 mg total) 2 (two) times daily by mouth.   I have changed Kimberly Pierce's amLODipine, telmisartan, traMADol, cyclobenzaprine, and  chlorthalidone. I am also having her start on potassium chloride and ciprofloxacin. Additionally, I am having her maintain her loratadine, aspirin EC, gabapentin, and Vitamin D (Ergocalciferol).  Meds ordered this encounter  Medications  . amLODipine (NORVASC) 10 MG tablet    Sig: Take 1 tablet (10 mg total) daily by mouth.    Dispense:  30 tablet    Refill:  0    Order Specific Question:   Supervising Provider    Answer:   Cassandria Anger [[id number]]  . telmisartan (MICARDIS) 80 MG tablet    Sig: Take 1 tablet (80 mg total) daily by mouth.    Dispense:  30 tablet    Refill:  0    Order Specific  Question:   Supervising Provider    Answer:   Cassandria Anger [[id number]]  . traMADol (ULTRAM) 50 MG tablet    Sig: Take 1 tablet (50 mg total) every 12 (twelve) hours as needed by mouth.    Dispense:  6 tablet    Refill:  0    Order Specific Question:   Supervising Provider    Answer:   Cassandria Anger [[id number]]  . cyclobenzaprine (FEXMID) 7.5 MG tablet    Sig: Take 1 tablet (7.5 mg total) at bedtime as needed by mouth for muscle spasms.    Dispense:  7 tablet    Refill:  0    Order Specific Question:   Supervising Provider    Answer:   Cassandria Anger [[id number]]  . chlorthalidone (HYGROTON) 25 MG tablet    Sig: Take 1 tablet (25 mg total) daily by mouth.    Dispense:  30 tablet    Refill:  0    Order Specific Question:   Supervising Provider    Answer:   Cassandria Anger [[id number]]  . potassium chloride (K-DUR) 10 MEQ tablet    Sig: Take 1 tablet (10 mEq total) daily by mouth.    Dispense:  30 tablet    Refill:  0    Order Specific Question:   Supervising Provider    Answer:   Cassandria Anger [[id number]]  . ciprofloxacin (CIPRO) 250 MG tablet    Sig: Take 1 tablet (250 mg total) 2 (two) times daily by mouth.    Dispense:  10 tablet    Refill:  0    Order Specific Question:   Supervising Provider    Answer:   Cassandria Anger [[id number]]    Follow-up: Return in about 3 weeks  (around 05/04/2017) for HTN aith Dr. Ronnald Ramp.  Wilfred Lacy, NP

## 2017-04-19 ENCOUNTER — Encounter: Payer: Self-pay | Admitting: Nurse Practitioner

## 2017-04-19 MED ORDER — CIPROFLOXACIN HCL 250 MG PO TABS: 250.0000 mg | ORAL_TABLET | Freq: Two times a day (BID) | ORAL | 0 refills | Status: DC

## 2017-04-19 MED ORDER — CHLORTHALIDONE 25 MG PO TABS: 25.0000 mg | ORAL_TABLET | Freq: Every day | ORAL | 0 refills | Status: DC

## 2017-04-19 MED ORDER — POTASSIUM CHLORIDE ER 10 MEQ PO TBCR: 10.0000 meq | EXTENDED_RELEASE_TABLET | Freq: Every day | ORAL | 0 refills | Status: DC

## 2017-04-20 MED FILL — CHLORTHALIDONE 25 MG TAB: 25 | 30 days supply | Qty: 30 | Fill #0

## 2017-04-20 MED FILL — CIPROFLOXACIN HCL 250 MG TA: 250 | 5 days supply | Qty: 10 | Fill #0

## 2017-04-20 MED FILL — POTASSIUM CL ER 10 MEQ TABL: 10 | 30 days supply | Qty: 30 | Fill #0

## 2017-05-04 ENCOUNTER — Other Ambulatory Visit (INDEPENDENT_AMBULATORY_CARE_PROVIDER_SITE_OTHER): Payer: 59

## 2017-05-04 ENCOUNTER — Telehealth: Payer: Self-pay

## 2017-05-04 ENCOUNTER — Ambulatory Visit (INDEPENDENT_AMBULATORY_CARE_PROVIDER_SITE_OTHER): Payer: 59 | Admitting: Internal Medicine

## 2017-05-04 ENCOUNTER — Encounter: Payer: Self-pay | Admitting: Internal Medicine

## 2017-05-04 VITALS — BP 150/90 | HR 74 | Temp 98.3°F | Ht 66.0 in | Wt 198.5 lb

## 2017-05-04 DIAGNOSIS — Z0000 Encounter for general adult medical examination without abnormal findings: Secondary | ICD-10-CM | POA: Diagnosis not present

## 2017-05-04 DIAGNOSIS — R739 Hyperglycemia, unspecified: Secondary | ICD-10-CM | POA: Diagnosis not present

## 2017-05-04 DIAGNOSIS — R011 Cardiac murmur, unspecified: Secondary | ICD-10-CM

## 2017-05-04 DIAGNOSIS — I10 Essential (primary) hypertension: Secondary | ICD-10-CM

## 2017-05-04 DIAGNOSIS — F17200 Nicotine dependence, unspecified, uncomplicated: Secondary | ICD-10-CM

## 2017-05-04 DIAGNOSIS — Z124 Encounter for screening for malignant neoplasm of cervix: Secondary | ICD-10-CM | POA: Insufficient documentation

## 2017-05-04 LAB — CBC WITH DIFFERENTIAL/PLATELET
BASOS ABS: 0.1 10*3/uL (ref 0.0–0.1)
Basophils Relative: 1.5 % (ref 0.0–3.0)
EOS PCT: 6.6 % — AB (ref 0.0–5.0)
Eosinophils Absolute: 0.3 10*3/uL (ref 0.0–0.7)
HCT: 37.5 % (ref 36.0–46.0)
HEMOGLOBIN: 12.4 g/dL (ref 12.0–15.0)
LYMPHS PCT: 50.1 % — AB (ref 12.0–46.0)
Lymphs Abs: 2.2 10*3/uL (ref 0.7–4.0)
MCHC: 33 g/dL (ref 30.0–36.0)
MCV: 97 fl (ref 78.0–100.0)
MONOS PCT: 5.7 % (ref 3.0–12.0)
Monocytes Absolute: 0.3 10*3/uL (ref 0.1–1.0)
NEUTROS PCT: 36.1 % — AB (ref 43.0–77.0)
Neutro Abs: 1.6 10*3/uL (ref 1.4–7.7)
Platelets: 323 10*3/uL (ref 150.0–400.0)
RBC: 3.87 Mil/uL (ref 3.87–5.11)
RDW: 12.4 % (ref 11.5–15.5)
WBC: 4.4 10*3/uL (ref 4.0–10.5)

## 2017-05-04 LAB — COMPREHENSIVE METABOLIC PANEL
ALBUMIN: 4.5 g/dL (ref 3.5–5.2)
ALK PHOS: 75 U/L (ref 39–117)
ALT: 19 U/L (ref 0–35)
AST: 17 U/L (ref 0–37)
BILIRUBIN TOTAL: 0.4 mg/dL (ref 0.2–1.2)
BUN: 18 mg/dL (ref 6–23)
CO2: 22 mEq/L (ref 19–32)
Calcium: 9.9 mg/dL (ref 8.4–10.5)
Chloride: 104 mEq/L (ref 96–112)
Creatinine, Ser: 0.56 mg/dL (ref 0.40–1.20)
GFR: 140.93 mL/min (ref >60.00)
GLUCOSE: 110 mg/dL — AB (ref 70–99)
POTASSIUM: 4 meq/L (ref 3.5–5.1)
Sodium: 138 mEq/L (ref 135–145)
TOTAL PROTEIN: 8 g/dL (ref 6.0–8.3)

## 2017-05-04 LAB — LIPID PANEL
CHOL/HDL RATIO: 3
CHOLESTEROL: 172 mg/dL (ref 0–200)
HDL: 49.9 mg/dL (ref >39.00)
LDL Cholesterol: 96 mg/dL (ref 0–99)
NONHDL: 121.68
Triglycerides: 128 mg/dL (ref 0.0–149.0)
VLDL: 25.6 mg/dL (ref 0.0–40.0)

## 2017-05-04 LAB — MAGNESIUM: MAGNESIUM: 1.9 mg/dL (ref 1.5–2.5)

## 2017-05-04 LAB — TSH: TSH: 0.96 u[IU]/mL (ref 0.35–4.50)

## 2017-05-04 LAB — HEMOGLOBIN A1C: HEMOGLOBIN A1C: 5.9 % (ref 4.6–6.5)

## 2017-05-04 MED ORDER — NEBIVOLOL HCL 5 MG PO TABS: 5.0000 mg | ORAL_TABLET | Freq: Every day | ORAL | 0 refills | Status: DC

## 2017-05-04 NOTE — Patient Instructions (Signed)

## 2017-05-04 NOTE — Progress Notes (Signed)
Subjective:  Patient ID: Kimberly Pierce, female    DOB: February 28, 1956  Age: 61 y.o. MRN: [id number]  CC: Hypertension and Annual Exam   HPI Kimberly Pierce presents for a CPX.  She complains that her blood pressure is not well controlled and amlodipine causes a headache.  She has had no recent episodes of blurred vision, chest pain, DOE, shortness of breath, cough, hemoptysis, or fatigue.  Outpatient Medications Prior to Visit  Medication Sig Dispense Refill  . aspirin EC 81 MG tablet Take 1 tablet (81 mg total) by mouth daily. 90 tablet 11  . chlorthalidone (HYGROTON) 25 MG tablet Take 1 tablet (25 mg total) daily by mouth. 30 tablet 0  . cyclobenzaprine (FEXMID) 7.5 MG tablet   0  . gabapentin (NEURONTIN) 100 MG capsule Take 1 capsule (100 mg total) by mouth at bedtime. 30 capsule 3  . loratadine (CLARITIN) 10 MG tablet Take 10 mg by mouth daily as needed.      . potassium chloride (K-DUR) 10 MEQ tablet Take 1 tablet (10 mEq total) daily by mouth. 30 tablet 0  . telmisartan (MICARDIS) 80 MG tablet Take 1 tablet (80 mg total) daily by mouth. 30 tablet 0  . traMADol (ULTRAM) 50 MG tablet Take 1 tablet (50 mg total) every 12 (twelve) hours as needed by mouth. 6 tablet 0  . Vitamin D, Ergocalciferol, (DRISDOL) 50000 units CAPS capsule TAKE 1 CAPSULE BY MOUTH EVERY 7 DAYS 12 capsule 1  . amLODipine (NORVASC) 10 MG tablet Take 1 tablet (10 mg total) daily by mouth. 30 tablet 0  . ciprofloxacin (CIPRO) 250 MG tablet Take 1 tablet (250 mg total) 2 (two) times daily by mouth. 10 tablet 0  . cyclobenzaprine (FEXMID) 7.5 MG tablet Take 1 tablet (7.5 mg total) at bedtime as needed by mouth for muscle spasms. 7 tablet 0   No facility-administered medications prior to visit.     ROS Review of Systems  Constitutional: Negative.  Negative for chills, diaphoresis, fatigue and fever.  HENT: Negative.  Negative for sinus pressure, sore throat and trouble swallowing.   Eyes: Negative.  Negative for  visual disturbance.  Respiratory: Negative for cough, chest tightness, shortness of breath and wheezing.   Cardiovascular: Negative.  Negative for chest pain, palpitations and leg swelling.  Gastrointestinal: Negative.  Negative for abdominal pain, constipation, diarrhea, nausea and vomiting.  Endocrine: Negative.   Genitourinary: Negative.  Negative for difficulty urinating.  Musculoskeletal: Negative.  Negative for arthralgias and myalgias.  Skin: Negative.  Negative for color change and rash.  Neurological: Positive for headaches. Negative for dizziness, weakness and light-headedness.  Hematological: Negative for adenopathy. Does not bruise/bleed easily.  Psychiatric/Behavioral: Negative.     Objective:  BP (!) 150/90 (BP Location: Left Arm, Patient Position: Sitting, Cuff Size: Large)   Pulse 74   Temp 98.3 F (36.8 C) (Oral)   Ht 5' 6"  (1.676 m)   Wt 198 lb 8 oz (90 kg)   SpO2 98%   BMI 32.04 kg/m   BP Readings from Last 3 Encounters:  05/04/17 (!) 150/90  04/16/17 (!) 216/94  05/07/16 (!) 236/110    Wt Readings from Last 3 Encounters:  05/04/17 198 lb 8 oz (90 kg)  04/16/17 201 lb (91.2 kg)  05/07/16 211 lb 2 oz (95.8 kg)    Physical Exam  Constitutional: She is oriented to person, place, and time. No distress.  HENT:  Mouth/Throat: Oropharynx is clear and moist. No oropharyngeal exudate.  Eyes:  Conjunctivae are normal. Right eye exhibits no discharge. Left eye exhibits no discharge. No scleral icterus.  Neck: Normal range of motion. Neck supple. No JVD present. No thyromegaly present.  Cardiovascular: Normal rate, regular rhythm and S2 normal. Exam reveals no gallop.  Murmur heard.  Systolic murmur is present with a grade of 1/6.  No diastolic murmur is present. EKG --  Sinus  Rhythm  -Anteroseptal infarct -age undetermined.   ABNORMAL - no change from the prior EKG  Pulmonary/Chest: Effort normal and breath sounds normal.  Abdominal: Soft. Bowel sounds are  normal. She exhibits no mass. There is no tenderness. There is no rebound and no guarding.  Musculoskeletal: Normal range of motion. She exhibits no edema, tenderness or deformity.  Lymphadenopathy:    She has no cervical adenopathy.  Neurological: She is alert and oriented to person, place, and time.  Skin: Skin is warm and dry. No rash noted. She is not diaphoretic. No erythema. No pallor.  Vitals reviewed.   Lab Results  Component Value Date   WBC 4.4 05/04/2017   HGB 12.4 05/04/2017   HCT 37.5 05/04/2017   PLT 323.0 05/04/2017   GLUCOSE 110 (H) 05/04/2017   CHOL 172 05/04/2017   TRIG 128.0 05/04/2017   HDL 49.90 05/04/2017   LDLDIRECT 92.1 03/23/2013   LDLCALC 96 05/04/2017   ALT 19 05/04/2017   AST 17 05/04/2017   NA 138 05/04/2017   K 4.0 05/04/2017   CL 104 05/04/2017   CREATININE 0.56 05/04/2017   BUN 18 05/04/2017   CO2 22 05/04/2017   TSH 0.96 05/04/2017   HGBA1C 5.9 05/04/2017    Mm Screening Breast Tomo Bilateral  Result Date: 02/21/2016 CLINICAL DATA:  Screening. EXAM: 2D DIGITAL SCREENING BILATERAL MAMMOGRAM WITH CAD AND ADJUNCT TOMO COMPARISON:  Previous exam(s). ACR Breast Density Category b: There are scattered areas of fibroglandular density. FINDINGS: There are no findings suspicious for malignancy. Images were processed with CAD. IMPRESSION: No mammographic evidence of malignancy. A result letter of this screening mammogram will be mailed directly to the patient. RECOMMENDATION: Screening mammogram in one year. (Code:SM-B-01Y) BI-RADS CATEGORY  1: Negative. Electronically Signed   By: Evangeline Dakin M.D.   On: 02/26/2016 08:33    Assessment & Plan:   Kimberly Pierce was seen today for hypertension and annual exam.  Diagnoses and all orders for this visit:  Hyperglycemia-her A1c is at 5.9%.  She has mild, stable prediabetes.  Medical therapy is not indicated.  She will continue to work on her lifestyle modifications. -     Hemoglobin A1c; Future  Routine  general medical examination at a health care facility- Exam completed, labs ordered and reviewed, she refused a flu vaccine, will screen her for hepatitis A B and C, will screen for HIV, to screen for colon polyps and cancer I have ordered a cologuard, patient ed material was given. -     Lipid panel; Future -     Comprehensive metabolic panel; Future -     CBC with Differential/Platelet; Future -     TSH; Future -     Hepatitis A antibody, total; Future -     Hepatitis B core antibody, total; Future -     Hepatitis B surface antibody; Future -     Hepatitis C antibody; Future -     HIV antibody; Future  Essential hypertension- Her blood pressure is not well controlled.  Will discontinue the amlodipine.  We will continue the thiazide diuretic and will add  on an alpha beta-blocker for better blood pressure control.  Her EKG is abnormal but unchanged over the years and she has no signs of ischemia or left ventricular hypertrophy. -     Magnesium; Future -     EKG 12-Lead -     nebivolol (BYSTOLIC) 5 MG tablet; Take 1 tablet (5 mg total) by mouth daily.  TOBACCO USE -     Ambulatory Referral for Lung Cancer Scre  Cervical cancer screening -     Ambulatory referral to Gynecology   I have discontinued Tazaria L. Gunnerson's amLODipine and ciprofloxacin. I am also having her start on nebivolol. Additionally, I am having her maintain her loratadine, aspirin EC, gabapentin, Vitamin D (Ergocalciferol), telmisartan, traMADol, chlorthalidone, potassium chloride, and cyclobenzaprine.  Meds ordered this encounter  Medications  . nebivolol (BYSTOLIC) 5 MG tablet    Sig: Take 1 tablet (5 mg total) by mouth daily.    Dispense:  70 tablet    Refill:  0     Follow-up: Return in about 2 months (around 07/04/2017).  [name], MD

## 2017-05-04 NOTE — Telephone Encounter (Signed)
Order [id number]

## 2017-05-05 LAB — HEPATITIS C ANTIBODY
HEP C AB: NONREACTIVE
SIGNAL TO CUT-OFF: 0.01 (ref <1.00)

## 2017-05-05 LAB — HEPATITIS A ANTIBODY, TOTAL: HEPATITIS A AB,TOTAL: NONREACTIVE

## 2017-05-05 LAB — HEPATITIS B CORE ANTIBODY, TOTAL: Hep B Core Total Ab: NONREACTIVE

## 2017-05-05 LAB — HEPATITIS B SURFACE ANTIBODY,QUALITATIVE: Hep B S Ab: NONREACTIVE

## 2017-05-05 LAB — HIV ANTIBODY (ROUTINE TESTING W REFLEX): HIV 1&2 Ab, 4th Generation: NONREACTIVE

## 2017-05-18 ENCOUNTER — Ambulatory Visit: Payer: Self-pay

## 2017-05-27 MED FILL — VIT D2 1.25 MG (50,000 UNIT: 1.25 MG | 84 days supply | Qty: 12 | Fill #1

## 2017-06-17 DIAGNOSIS — Z1211 Encounter for screening for malignant neoplasm of colon: Secondary | ICD-10-CM | POA: Diagnosis not present

## 2017-06-17 DIAGNOSIS — Z1212 Encounter for screening for malignant neoplasm of rectum: Secondary | ICD-10-CM | POA: Diagnosis not present

## 2017-06-22 ENCOUNTER — Ambulatory Visit: Payer: Self-pay

## 2017-06-27 LAB — COLOGUARD: COLOGUARD: NEGATIVE

## 2017-07-06 ENCOUNTER — Encounter: Payer: Self-pay | Admitting: Internal Medicine

## 2017-07-06 ENCOUNTER — Telehealth: Payer: Self-pay

## 2017-07-06 ENCOUNTER — Ambulatory Visit: Payer: 59 | Admitting: Internal Medicine

## 2017-07-06 VITALS — BP 180/102 | HR 67 | Temp 97.9°F | Resp 16 | Ht 66.0 in | Wt 196.8 lb

## 2017-07-06 DIAGNOSIS — Z23 Encounter for immunization: Secondary | ICD-10-CM

## 2017-07-06 DIAGNOSIS — I10 Essential (primary) hypertension: Secondary | ICD-10-CM

## 2017-07-06 NOTE — Progress Notes (Signed)
Subjective:  Patient ID: Kimberly Pierce, female    DOB: [birth year]  Age: 62 y.o. MRN: [id number]  CC: Hypertension   HPI Kimberly Pierce presents for a blood pressure check.  She is not taking chlorthalidone because she did not know to what it was for.  She is also not sure that she is taking Bystolic.  She thinks she is taking the telmisartan.  She feels well today and denies headache/blurred vision/chest pain/shortness of breath/edema/fatigue.  Outpatient Medications Prior to Visit  Medication Sig Dispense Refill  . aspirin EC 81 MG tablet Take 1 tablet (81 mg total) by mouth daily. 90 tablet 11  . cyclobenzaprine (FEXMID) 7.5 MG tablet   0  . gabapentin (NEURONTIN) 100 MG capsule Take 1 capsule (100 mg total) by mouth at bedtime. 30 capsule 3  . loratadine (CLARITIN) 10 MG tablet Take 10 mg by mouth daily as needed.      . nebivolol (BYSTOLIC) 5 MG tablet Take 1 tablet (5 mg total) by mouth daily. 70 tablet 0  . telmisartan (MICARDIS) 80 MG tablet Take 1 tablet (80 mg total) daily by mouth. 30 tablet 0  . traMADol (ULTRAM) 50 MG tablet Take 1 tablet (50 mg total) every 12 (twelve) hours as needed by mouth. 6 tablet 0  . Vitamin D, Ergocalciferol, (DRISDOL) 50000 units CAPS capsule TAKE 1 CAPSULE BY MOUTH EVERY 7 DAYS 12 capsule 1  . chlorthalidone (HYGROTON) 25 MG tablet Take 1 tablet (25 mg total) daily by mouth. (Patient not taking: Reported on 07/06/2017) 30 tablet 0  . potassium chloride (K-DUR) 10 MEQ tablet Take 1 tablet (10 mEq total) daily by mouth. (Patient not taking: Reported on 07/06/2017) 30 tablet 0   No facility-administered medications prior to visit.     ROS Review of Systems  Constitutional: Negative for diaphoresis, fatigue and unexpected weight change.  HENT: Negative.   Eyes: Negative for visual disturbance.  Respiratory: Negative.  Negative for cough, chest tightness, shortness of breath and wheezing.   Cardiovascular: Negative.  Negative for chest pain,  palpitations and leg swelling.  Gastrointestinal: Negative for abdominal pain, diarrhea, nausea and vomiting.  Genitourinary: Negative.  Negative for difficulty urinating, dysuria and hematuria.  Musculoskeletal: Negative for back pain and neck pain.  Skin: Negative.  Negative for color change.  Neurological: Negative.  Negative for dizziness, weakness, light-headedness and headaches.  Hematological: Negative for adenopathy. Does not bruise/bleed easily.  Psychiatric/Behavioral: Negative.     Objective:  BP (!) 180/102 (BP Location: Left Arm, Patient Position: Sitting, Cuff Size: Normal)   Pulse 67   Temp 97.9 F (36.6 C) (Oral)   Resp 16   Ht 5' 6"  (1.676 m)   Wt 196 lb 12 oz (89.2 kg)   SpO2 97%   BMI 31.76 kg/m   BP Readings from Last 3 Encounters:  07/06/17 (!) 180/102  05/04/17 (!) 150/90  04/16/17 (!) 216/94    Wt Readings from Last 3 Encounters:  07/06/17 196 lb 12 oz (89.2 kg)  05/04/17 198 lb 8 oz (90 kg)  04/16/17 201 lb (91.2 kg)    Physical Exam  Constitutional: She is oriented to person, place, and time. No distress.  HENT:  Mouth/Throat: Oropharynx is clear and moist. No oropharyngeal exudate.  Eyes: Conjunctivae are normal. Left eye exhibits no discharge. No scleral icterus.  Neck: Normal range of motion. Neck supple. No JVD present. No thyromegaly present.  Cardiovascular: Normal rate, regular rhythm and normal heart sounds. Exam reveals no  gallop.  No murmur heard. Pulmonary/Chest: Breath sounds normal. No respiratory distress. She has no wheezes. She has no rales.  Abdominal: Soft. Bowel sounds are normal. She exhibits no distension and no mass. There is no tenderness.  Musculoskeletal: Normal range of motion. She exhibits no edema, tenderness or deformity.  Lymphadenopathy:    She has no cervical adenopathy.  Neurological: She is alert and oriented to person, place, and time.  Skin: Skin is warm and dry. No rash noted. She is not diaphoretic. No  erythema. No pallor.  Vitals reviewed.   Lab Results  Component Value Date   WBC 4.4 05/04/2017   HGB 12.4 05/04/2017   HCT 37.5 05/04/2017   PLT 323.0 05/04/2017   GLUCOSE 110 (H) 05/04/2017   CHOL 172 05/04/2017   TRIG 128.0 05/04/2017   HDL 49.90 05/04/2017   LDLDIRECT 92.1 03/23/2013   LDLCALC 96 05/04/2017   ALT 19 05/04/2017   AST 17 05/04/2017   NA 138 05/04/2017   K 4.0 05/04/2017   CL 104 05/04/2017   CREATININE 0.56 05/04/2017   BUN 18 05/04/2017   CO2 22 05/04/2017   TSH 0.96 05/04/2017   HGBA1C 5.9 05/04/2017    Mm Screening Breast Tomo Bilateral  Result Date: 02/21/2016 CLINICAL DATA:  Screening. EXAM: 2D DIGITAL SCREENING BILATERAL MAMMOGRAM WITH CAD AND ADJUNCT TOMO COMPARISON:  Previous exam(s). ACR Breast Density Category b: There are scattered areas of fibroglandular density. FINDINGS: There are no findings suspicious for malignancy. Images were processed with CAD. IMPRESSION: No mammographic evidence of malignancy. A result letter of this screening mammogram will be mailed directly to the patient. RECOMMENDATION: Screening mammogram in one year. (Code:SM-B-01Y) BI-RADS CATEGORY  1: Negative. Electronically Signed   By: Evangeline Dakin M.D.   On: 02/26/2016 08:33    Assessment & Plan:   Kimberly Pierce was seen today for hypertension.  Diagnoses and all orders for this visit:  Need for hepatitis A and B vaccination - She was given her first dose today.  She will return in 1 month for a booster. -     Hepatitis A hepatitis B combined vaccine IM  Essential hypertension- Her blood pressure is not controlled due to noncompliance.  I went over her medications with her and she agrees to be compliant with all 3 antihypertensives.  She will return in 4 weeks for a recheck of her blood pressure.   I am having Kimberly Pierce maintain her loratadine, aspirin EC, gabapentin, Vitamin D (Ergocalciferol), telmisartan, traMADol, chlorthalidone, potassium chloride,  cyclobenzaprine, and nebivolol.  No orders of the defined types were placed in this encounter.    Follow-up: Return in about 4 weeks (around 08/03/2017).  [name], MD

## 2017-07-06 NOTE — Telephone Encounter (Signed)
Pt is having a pap done next month. Will follow up with [facility].

## 2017-07-06 NOTE — Patient Instructions (Signed)

## 2017-07-15 ENCOUNTER — Ambulatory Visit: Payer: Self-pay

## 2017-08-05 ENCOUNTER — Ambulatory Visit
Admission: RE | Admit: 2017-08-05 | Discharge: 2017-08-05 | Disposition: A | Discharge status: Home / Self Care | Payer: 59 | Source: Ambulatory Visit | Attending: Internal Medicine | Admitting: Internal Medicine

## 2017-08-05 DIAGNOSIS — Z1231 Encounter for screening mammogram for malignant neoplasm of breast: Secondary | ICD-10-CM

## 2017-08-05 LAB — HM MAMMOGRAPHY

## 2017-08-06 ENCOUNTER — Other Ambulatory Visit (INDEPENDENT_AMBULATORY_CARE_PROVIDER_SITE_OTHER): Payer: 59

## 2017-08-06 ENCOUNTER — Other Ambulatory Visit: Payer: Self-pay | Admitting: Nurse Practitioner

## 2017-08-06 ENCOUNTER — Encounter: Payer: Self-pay | Admitting: Internal Medicine

## 2017-08-06 ENCOUNTER — Ambulatory Visit (INDEPENDENT_AMBULATORY_CARE_PROVIDER_SITE_OTHER): Payer: 59 | Admitting: Internal Medicine

## 2017-08-06 VITALS — BP 128/80 | HR 65 | Temp 98.3°F | Ht 66.0 in | Wt 197.2 lb

## 2017-08-06 DIAGNOSIS — E559 Vitamin D deficiency, unspecified: Secondary | ICD-10-CM | POA: Diagnosis not present

## 2017-08-06 DIAGNOSIS — I10 Essential (primary) hypertension: Secondary | ICD-10-CM | POA: Diagnosis not present

## 2017-08-06 DIAGNOSIS — Z23 Encounter for immunization: Secondary | ICD-10-CM

## 2017-08-06 LAB — BASIC METABOLIC PANEL
BUN: 20 mg/dL (ref 6–23)
CHLORIDE: 105 meq/L (ref 96–112)
CO2: 22 meq/L (ref 19–32)
Calcium: 9.7 mg/dL (ref 8.4–10.5)
Creatinine, Ser: 0.53 mg/dL (ref 0.40–1.20)
GFR: 150.05 mL/min (ref >60.00)
GLUCOSE: 96 mg/dL (ref 70–99)
POTASSIUM: 4 meq/L (ref 3.5–5.1)
SODIUM: 138 meq/L (ref 135–145)

## 2017-08-06 LAB — CBC WITH DIFFERENTIAL/PLATELET
BASOS PCT: 1.1 % (ref 0.0–3.0)
Basophils Absolute: 0.1 10*3/uL (ref 0.0–0.1)
EOS ABS: 0.2 10*3/uL (ref 0.0–0.7)
EOS PCT: 4.1 % (ref 0.0–5.0)
HEMATOCRIT: 37 % (ref 36.0–46.0)
HEMOGLOBIN: 12.5 g/dL (ref 12.0–15.0)
LYMPHS PCT: 39.4 % (ref 12.0–46.0)
Lymphs Abs: 1.9 10*3/uL (ref 0.7–4.0)
MCHC: 33.9 g/dL (ref 30.0–36.0)
MCV: 97.6 fl (ref 78.0–100.0)
MONOS PCT: 6.4 % (ref 3.0–12.0)
Monocytes Absolute: 0.3 10*3/uL (ref 0.1–1.0)
NEUTROS ABS: 2.4 10*3/uL (ref 1.4–7.7)
Neutrophils Relative %: 49 % (ref 43.0–77.0)
PLATELETS: 199 10*3/uL (ref 150.0–400.0)
RBC: 3.79 Mil/uL — ABNORMAL LOW (ref 3.87–5.11)
RDW: 14 % (ref 11.5–15.5)
WBC: 4.8 10*3/uL (ref 4.0–10.5)

## 2017-08-06 MED ORDER — VITAMIN D (ERGOCALCIFEROL) 1.25 MG (50000 UNIT) PO CAPS
ORAL_CAPSULE | ORAL | 1 refills | Status: DC
Start: 2017-08-06 — End: 2018-08-10

## 2017-08-06 MED ORDER — POTASSIUM CHLORIDE ER 10 MEQ PO TBCR: 10.0000 meq | EXTENDED_RELEASE_TABLET | Freq: Every day | ORAL | 3 refills | Status: DC

## 2017-08-06 NOTE — Progress Notes (Signed)
Subjective:  Patient ID: Kimberly Pierce, female    DOB: 04/24/1956  Age: 62 y.o. MRN: [id number]  CC: Hypertension   HPI Kimberly Pierce presents for f/up - She tells me her blood pressure has been well controlled.  She feels well today and offers no complaints.  She is taking nebivolol and chlorthalidone but she is not taking the ARB.  Outpatient Medications Prior to Visit  Medication Sig Dispense Refill  . aspirin EC 81 MG tablet Take 1 tablet (81 mg total) by mouth daily. 90 tablet 11  . chlorthalidone (HYGROTON) 25 MG tablet Take 1 tablet (25 mg total) daily by mouth. 30 tablet 0  . cyclobenzaprine (FEXMID) 7.5 MG tablet   0  . gabapentin (NEURONTIN) 100 MG capsule Take 1 capsule (100 mg total) by mouth at bedtime. 30 capsule 3  . loratadine (CLARITIN) 10 MG tablet Take 10 mg by mouth daily as needed.      . nebivolol (BYSTOLIC) 5 MG tablet Take 1 tablet (5 mg total) by mouth daily. 70 tablet 0  . potassium chloride (K-DUR) 10 MEQ tablet Take 1 tablet (10 mEq total) daily by mouth. 30 tablet 0  . traMADol (ULTRAM) 50 MG tablet Take 1 tablet (50 mg total) every 12 (twelve) hours as needed by mouth. (Patient not taking: Reported on 08/06/2017) 6 tablet 0  . telmisartan (MICARDIS) 80 MG tablet Take 1 tablet (80 mg total) daily by mouth. (Patient not taking: Reported on 08/06/2017) 30 tablet 0  . Vitamin D, Ergocalciferol, (DRISDOL) 50000 units CAPS capsule TAKE 1 CAPSULE BY MOUTH EVERY 7 DAYS (Patient not taking: Reported on 08/06/2017) 12 capsule 1   No facility-administered medications prior to visit.     ROS Review of Systems  Respiratory: Negative for cough, chest tightness, shortness of breath and wheezing.   Cardiovascular: Negative for chest pain and leg swelling.  Gastrointestinal: Negative for abdominal pain.  Genitourinary: Negative for difficulty urinating.  Neurological: Negative for dizziness and light-headedness.  All other systems reviewed and are  negative.   Objective:  BP 128/80 (BP Location: Left Arm, Patient Position: Sitting, Cuff Size: Normal)   Pulse 65   Temp 98.3 F (36.8 C) (Oral)   Ht 5' 6"  (1.676 m)   Wt 197 lb 4 oz (89.5 kg)   SpO2 98%   BMI 31.84 kg/m   BP Readings from Last 3 Encounters:  08/06/17 128/80  07/06/17 (!) 180/102  05/04/17 (!) 150/90    Wt Readings from Last 3 Encounters:  08/06/17 197 lb 4 oz (89.5 kg)  07/06/17 196 lb 12 oz (89.2 kg)  05/04/17 198 lb 8 oz (90 kg)    Physical Exam  Constitutional: She is oriented to person, place, and time. No distress.  HENT:  Mouth/Throat: Oropharynx is clear and moist. No oropharyngeal exudate.  Eyes: Left eye exhibits no discharge. No scleral icterus.  Neck: Neck supple. No JVD present. No thyromegaly present.  Cardiovascular: Normal rate and normal heart sounds. Exam reveals no gallop.  No murmur heard. Pulmonary/Chest: Effort normal and breath sounds normal. No respiratory distress. She has no wheezes. She has no rales.  Abdominal: Soft. Bowel sounds are normal. She exhibits no distension and no mass. There is no tenderness.  Musculoskeletal: Normal range of motion. She exhibits no edema, tenderness or deformity.  Lymphadenopathy:    She has no cervical adenopathy.  Neurological: She is alert and oriented to person, place, and time.  Skin: Skin is warm and dry. No  rash noted. She is not diaphoretic. No erythema. No pallor.  Vitals reviewed.   Lab Results  Component Value Date   WBC 4.8 08/06/2017   HGB 12.5 08/06/2017   HCT 37.0 08/06/2017   PLT 199.0 08/06/2017   GLUCOSE 96 08/06/2017   CHOL 172 05/04/2017   TRIG 128.0 05/04/2017   HDL 49.90 05/04/2017   LDLDIRECT 92.1 03/23/2013   LDLCALC 96 05/04/2017   ALT 19 05/04/2017   AST 17 05/04/2017   NA 138 08/06/2017   K 4.0 08/06/2017   CL 105 08/06/2017   CREATININE 0.53 08/06/2017   BUN 20 08/06/2017   CO2 22 08/06/2017   TSH 0.96 05/04/2017   HGBA1C 5.9 05/04/2017    Mm  Screening Breast Tomo Bilateral  Result Date: 08/05/2017 CLINICAL DATA:  Screening. EXAM: DIGITAL SCREENING BILATERAL MAMMOGRAM WITH TOMO AND CAD COMPARISON:  Previous exam(s). ACR Breast Density Category b: There are scattered areas of fibroglandular density. FINDINGS: There are no findings suspicious for malignancy. Images were processed with CAD. IMPRESSION: No mammographic evidence of malignancy. A result letter of this screening mammogram will be mailed directly to the patient. RECOMMENDATION: Screening mammogram in one year. (Code:SM-B-01Y) BI-RADS CATEGORY  1: Negative. Electronically Signed   By: Marin Olp M.D.   On: 08/05/2017 17:26    Assessment & Plan:   Kimberly Pierce was seen today for hypertension.  Diagnoses and all orders for this visit:  Need for hepatitis A and B vaccination -     Hepatitis A hepatitis B combined vaccine IM  Essential hypertension- Her blood pressure is well controlled.  Electrolytes and renal function are normal.  Will continue the current regimen with the potassium supplement. -     CBC with Differential/Platelet; Future -     Basic metabolic panel; Future -     potassium chloride (K-DUR) 10 MEQ tablet; Take 1 tablet (10 mEq total) by mouth daily.  Vitamin D deficiency -     Vitamin D, Ergocalciferol, (DRISDOL) 50000 units CAPS capsule; TAKE 1 CAPSULE BY MOUTH EVERY 7 DAYS   I have discontinued Kimberly Pierce's telmisartan. I have also changed her potassium chloride. Additionally, I am having her maintain her loratadine, aspirin EC, gabapentin, traMADol, chlorthalidone, cyclobenzaprine, nebivolol, and Vitamin D (Ergocalciferol).  Meds ordered this encounter  Medications  . Vitamin D, Ergocalciferol, (DRISDOL) 50000 units CAPS capsule    Sig: TAKE 1 CAPSULE BY MOUTH EVERY 7 DAYS    Dispense:  12 capsule    Refill:  1  . potassium chloride (K-DUR) 10 MEQ tablet    Sig: Take 1 tablet (10 mEq total) by mouth daily.    Dispense:  30 tablet    Refill:  3      Follow-up: Return in about 6 months (around 02/03/2018).  [name], MD

## 2017-08-06 NOTE — Patient Instructions (Signed)

## 2017-08-24 MED FILL — VIT D2 1.25 MG (50,000 UNIT: 1.25 MG | 84 days supply | Qty: 12 | Fill #0

## 2018-02-10 ENCOUNTER — Ambulatory Visit: Payer: 59

## 2018-03-03 ENCOUNTER — Ambulatory Visit (INDEPENDENT_AMBULATORY_CARE_PROVIDER_SITE_OTHER): Payer: 59

## 2018-03-03 DIAGNOSIS — Z23 Encounter for immunization: Secondary | ICD-10-CM | POA: Diagnosis not present

## 2018-03-03 DIAGNOSIS — Z299 Encounter for prophylactic measures, unspecified: Secondary | ICD-10-CM

## 2018-04-01 MED FILL — VIT D2 1.25 MG (50,000 UNIT: 1.25 MG | 84 days supply | Qty: 12 | Fill #1

## 2018-07-07 ENCOUNTER — Other Ambulatory Visit: Payer: Self-pay | Admitting: Internal Medicine

## 2018-07-07 DIAGNOSIS — Z1231 Encounter for screening mammogram for malignant neoplasm of breast: Secondary | ICD-10-CM

## 2018-08-09 ENCOUNTER — Ambulatory Visit
Admission: RE | Admit: 2018-08-09 | Discharge: 2018-08-09 | Disposition: A | Discharge status: Home / Self Care | Payer: 59 | Source: Ambulatory Visit | Attending: Internal Medicine | Admitting: Internal Medicine

## 2018-08-09 DIAGNOSIS — Z1231 Encounter for screening mammogram for malignant neoplasm of breast: Secondary | ICD-10-CM

## 2018-08-10 ENCOUNTER — Other Ambulatory Visit: Payer: Self-pay | Admitting: Internal Medicine

## 2018-08-10 DIAGNOSIS — E559 Vitamin D deficiency, unspecified: Secondary | ICD-10-CM

## 2018-08-10 LAB — HM MAMMOGRAPHY

## 2018-08-10 MED FILL — VIT D2 1.25 MG (50,000 UNIT: 1.25 MG | 84 days supply | Qty: 12 | Fill #0

## 2018-11-15 ENCOUNTER — Other Ambulatory Visit: Payer: Self-pay | Admitting: Internal Medicine

## 2018-11-15 ENCOUNTER — Telehealth: Payer: Self-pay | Admitting: Internal Medicine

## 2018-11-15 DIAGNOSIS — I10 Essential (primary) hypertension: Secondary | ICD-10-CM

## 2018-11-15 MED ORDER — CHLORTHALIDONE 25 MG PO TABS: 25.0000 mg | ORAL_TABLET | Freq: Every day | ORAL | 0 refills | Status: DC

## 2018-11-15 MED FILL — CHLORTHALIDONE 25 MG TABLET: 25 | 90 days supply | Qty: 90 | Fill #0

## 2018-11-15 NOTE — Telephone Encounter (Unsigned)
Copied from [place] [id number]. Topic: Quick Communication - Rx Refill/Question >> Nov 15, 2018 11:03 AM Celene Kras A wrote: Medication: chlorthalidone (HYGROTON) 25 MG tablet  Has the patient contacted their pharmacy? No. Pt states she is out of this medication and needs it sent in today if possible. Please advise.  (Agent: If no, request that the patient contact the pharmacy for the refill.) (Agent: If yes, when and what did the pharmacy advise?)  Preferred Pharmacy (with phone number or street name): [place], [place] [address] Phone: [phone] Fax: [phone] Not a 24 hour pharmacy; exact hours not known.    Agent: Please be advised that RX refills may take up to 3 business days. We ask that you follow-up with your pharmacy.

## 2018-12-27 ENCOUNTER — Other Ambulatory Visit: Payer: Self-pay | Admitting: Internal Medicine

## 2018-12-27 DIAGNOSIS — E559 Vitamin D deficiency, unspecified: Secondary | ICD-10-CM

## 2018-12-28 ENCOUNTER — Other Ambulatory Visit: Payer: Self-pay | Admitting: Internal Medicine

## 2018-12-28 DIAGNOSIS — E559 Vitamin D deficiency, unspecified: Secondary | ICD-10-CM

## 2019-02-16 ENCOUNTER — Other Ambulatory Visit: Payer: Self-pay | Admitting: Internal Medicine

## 2019-02-16 ENCOUNTER — Encounter: Payer: Self-pay | Admitting: Internal Medicine

## 2019-02-16 ENCOUNTER — Other Ambulatory Visit: Payer: Self-pay

## 2019-02-16 ENCOUNTER — Ambulatory Visit (INDEPENDENT_AMBULATORY_CARE_PROVIDER_SITE_OTHER): Payer: 59 | Admitting: Internal Medicine

## 2019-02-16 ENCOUNTER — Other Ambulatory Visit (INDEPENDENT_AMBULATORY_CARE_PROVIDER_SITE_OTHER): Payer: 59

## 2019-02-16 VITALS — BP 172/106 | HR 77 | Temp 98.1°F | Resp 16 | Ht 66.0 in | Wt 191.0 lb

## 2019-02-16 DIAGNOSIS — E559 Vitamin D deficiency, unspecified: Secondary | ICD-10-CM | POA: Diagnosis not present

## 2019-02-16 DIAGNOSIS — R011 Cardiac murmur, unspecified: Secondary | ICD-10-CM | POA: Diagnosis not present

## 2019-02-16 DIAGNOSIS — I10 Essential (primary) hypertension: Secondary | ICD-10-CM

## 2019-02-16 DIAGNOSIS — Z23 Encounter for immunization: Secondary | ICD-10-CM

## 2019-02-16 DIAGNOSIS — Z0000 Encounter for general adult medical examination without abnormal findings: Secondary | ICD-10-CM

## 2019-02-16 DIAGNOSIS — R739 Hyperglycemia, unspecified: Secondary | ICD-10-CM

## 2019-02-16 DIAGNOSIS — R945 Abnormal results of liver function studies: Secondary | ICD-10-CM

## 2019-02-16 DIAGNOSIS — E785 Hyperlipidemia, unspecified: Secondary | ICD-10-CM | POA: Diagnosis not present

## 2019-02-16 DIAGNOSIS — R748 Abnormal levels of other serum enzymes: Secondary | ICD-10-CM

## 2019-02-16 DIAGNOSIS — R7989 Other specified abnormal findings of blood chemistry: Secondary | ICD-10-CM | POA: Diagnosis not present

## 2019-02-16 LAB — HEPATIC FUNCTION PANEL
ALT: 60 U/L — ABNORMAL HIGH (ref 0–35)
AST: 66 U/L — ABNORMAL HIGH (ref 0–37)
Albumin: 4.2 g/dL (ref 3.5–5.2)
Alkaline Phosphatase: 124 U/L — ABNORMAL HIGH (ref 39–117)
Bilirubin, Direct: 0.3 mg/dL (ref 0.0–0.3)
Total Bilirubin: 1.3 mg/dL — ABNORMAL HIGH (ref 0.2–1.2)
Total Protein: 8.2 g/dL (ref 6.0–8.3)

## 2019-02-16 LAB — CBC WITH DIFFERENTIAL/PLATELET
Basophils Absolute: 0.1 10*3/uL (ref 0.0–0.1)
Basophils Relative: 1 % (ref 0.0–3.0)
Eosinophils Absolute: 0.2 10*3/uL (ref 0.0–0.7)
Eosinophils Relative: 3.2 % (ref 0.0–5.0)
HCT: 37.7 % (ref 36.0–46.0)
Hemoglobin: 12.7 g/dL (ref 12.0–15.0)
Lymphocytes Relative: 41.3 % (ref 12.0–46.0)
Lymphs Abs: 2.4 10*3/uL (ref 0.7–4.0)
MCHC: 33.6 g/dL (ref 30.0–36.0)
MCV: 99.7 fl (ref 78.0–100.0)
Monocytes Absolute: 0.5 10*3/uL (ref 0.1–1.0)
Monocytes Relative: 7.9 % (ref 3.0–12.0)
Neutro Abs: 2.7 10*3/uL (ref 1.4–7.7)
Neutrophils Relative %: 46.6 % (ref 43.0–77.0)
Platelets: 175 10*3/uL (ref 150.0–400.0)
RBC: 3.78 Mil/uL — ABNORMAL LOW (ref 3.87–5.11)
RDW: 13.2 % (ref 11.5–15.5)
WBC: 5.8 10*3/uL (ref 4.0–10.5)

## 2019-02-16 LAB — BASIC METABOLIC PANEL
BUN: 19 mg/dL (ref 6–23)
CO2: 25 mEq/L (ref 19–32)
Calcium: 9.5 mg/dL (ref 8.4–10.5)
Chloride: 102 mEq/L (ref 96–112)
Creatinine, Ser: 0.5 mg/dL (ref 0.40–1.20)
GFR: 150.26 mL/min (ref >60.00)
Glucose, Bld: 107 mg/dL — ABNORMAL HIGH (ref 70–99)
Potassium: 3.7 mEq/L (ref 3.5–5.1)
Sodium: 136 mEq/L (ref 135–145)

## 2019-02-16 LAB — IBC PANEL
Iron: 151 ug/dL — ABNORMAL HIGH (ref 42–145)
Saturation Ratios: 58 % — ABNORMAL HIGH (ref 20.0–50.0)
Transferrin: 186 mg/dL — ABNORMAL LOW (ref 212.0–360.0)

## 2019-02-16 LAB — LIPID PANEL
Cholesterol: 162 mg/dL (ref 0–200)
HDL: 60.5 mg/dL (ref >39.00)
LDL Cholesterol: 83 mg/dL (ref 0–99)
NonHDL: 101.74
Total CHOL/HDL Ratio: 3
Triglycerides: 95 mg/dL (ref 0.0–149.0)
VLDL: 19 mg/dL (ref 0.0–40.0)

## 2019-02-16 LAB — VITAMIN D 25 HYDROXY (VIT D DEFICIENCY, FRACTURES): VITD: 45.69 ng/mL (ref 30.00–100.00)

## 2019-02-16 LAB — HEMOGLOBIN A1C: Hgb A1c MFr Bld: 5.7 % (ref 4.6–6.5)

## 2019-02-16 LAB — TSH: TSH: 1.32 u[IU]/mL (ref 0.35–4.50)

## 2019-02-16 LAB — FERRITIN: Ferritin: 569.1 ng/mL — ABNORMAL HIGH (ref 10.0–291.0)

## 2019-02-16 MED ORDER — NEBIVOLOL HCL 5 MG PO TABS: 5.0000 mg | ORAL_TABLET | Freq: Every day | ORAL | 0 refills | Status: DC

## 2019-02-16 MED ORDER — CHLORTHALIDONE 25 MG PO TABS: 25.0000 mg | ORAL_TABLET | Freq: Every day | ORAL | 0 refills | Status: DC

## 2019-02-16 MED ORDER — POTASSIUM CHLORIDE ER 10 MEQ PO TBCR: 10.0000 meq | EXTENDED_RELEASE_TABLET | Freq: Every day | ORAL | 3 refills | Status: DC

## 2019-02-16 MED ORDER — ROSUVASTATIN CALCIUM 5 MG PO TABS: 5.0000 mg | ORAL_TABLET | Freq: Every day | ORAL | 1 refills | Status: DC

## 2019-02-16 MED FILL — CHLORTHALIDONE 25 MG TABS: 25 | 90 days supply | Qty: 90 | Fill #0

## 2019-02-16 MED FILL — POTASSIUM CL ER 10 MEQ TAB: 10 | 30 days supply | Qty: 30 | Fill #0

## 2019-02-16 MED FILL — ROSUVASTATIN CALCIUM 5 MG T: 5 | 90 days supply | Qty: 90 | Fill #0

## 2019-02-16 MED FILL — VIT D2 1.25 MG (50,000 UNIT: 1.25 MG | 84 days supply | Qty: 12 | Fill #0

## 2019-02-16 NOTE — Patient Instructions (Signed)

## 2019-02-16 NOTE — Progress Notes (Signed)
Subjective:  Patient ID: Kimberly Pierce, female    DOB: 1955/12/16  Age: 63 y.o. MRN: [id number]  CC: Annual Exam and Hypertension   HPI [name] presents for a CPX.  It is hard to get a history from her but it sounds like she has not recently been taking any antihypertensives because she ran out.  She does not monitor her blood pressure.  She denies headache, blurred vision, chest pain, shortness of breath, palpitations, edema, or fatigue.  Outpatient Medications Prior to Visit  Medication Sig Dispense Refill  . aspirin EC 81 MG tablet Take 1 tablet (81 mg total) by mouth daily. 90 tablet 11  . loratadine (CLARITIN) 10 MG tablet Take 10 mg by mouth daily as needed.      . chlorthalidone (HYGROTON) 25 MG tablet Take 1 tablet (25 mg total) by mouth daily. 90 tablet 0  . gabapentin (NEURONTIN) 100 MG capsule Take 1 capsule (100 mg total) by mouth at bedtime. 30 capsule 3  . nebivolol (BYSTOLIC) 5 MG tablet Take 1 tablet (5 mg total) by mouth daily. 70 tablet 0  . potassium chloride (K-DUR) 10 MEQ tablet Take 1 tablet (10 mEq total) by mouth daily. 30 tablet 3  . cyclobenzaprine (FEXMID) 7.5 MG tablet   0  . Vitamin D, Ergocalciferol, (DRISDOL) 1.25 MG (50000 UT) CAPS capsule TAKE 1 CAPSULE BY MOUTH EVERY 7 DAYS (Patient not taking: Reported on 02/16/2019) 12 capsule 0  . traMADol (ULTRAM) 50 MG tablet Take 1 tablet (50 mg total) every 12 (twelve) hours as needed by mouth. (Patient not taking: Reported on 08/06/2017) 6 tablet 0   No facility-administered medications prior to visit.     ROS Review of Systems  Constitutional: Negative.  Negative for appetite change, fatigue, fever and unexpected weight change.  HENT: Negative.   Eyes: Negative for visual disturbance.  Respiratory: Negative for cough, chest tightness, shortness of breath and wheezing.   Cardiovascular: Negative for chest pain, palpitations and leg swelling.  Gastrointestinal: Negative for abdominal pain, constipation,  diarrhea, nausea and vomiting.  Endocrine: Negative.   Genitourinary: Negative.  Negative for difficulty urinating, dysuria and hematuria.  Musculoskeletal: Negative.   Skin: Negative.  Negative for color change and pallor.  Neurological: Negative.  Negative for dizziness, weakness, light-headedness and headaches.  Hematological: Negative for adenopathy. Does not bruise/bleed easily.  Psychiatric/Behavioral: Negative.     Objective:  BP (!) 172/106 (BP Location: Left Arm, Patient Position: Sitting, Cuff Size: Normal)   Pulse 77   Temp 98.1 F (36.7 C) (Oral)   Resp 16   Ht _0  (1.676 m)   Wt 191 lb (86.6 kg)   SpO2 97%   BMI 30.83 kg/m   BP Readings from Last 3 Encounters:  02/16/19 (!) 172/106  08/06/17 128/80  07/06/17 (!) 180/102    Wt Readings from Last 3 Encounters:  02/16/19 191 lb (86.6 kg)  08/06/17 197 lb 4 oz (89.5 kg)  07/06/17 196 lb 12 oz (89.2 kg)    Physical Exam Vitals signs reviewed.  Constitutional:      General: She is not in acute distress.    Appearance: She is obese. She is not ill-appearing, toxic-appearing or diaphoretic.  HENT:     Nose: Nose normal.     Mouth/Throat:     Mouth: Mucous membranes are moist.  Eyes:     General: No scleral icterus.    Conjunctiva/sclera: Conjunctivae normal.  Cardiovascular:     Rate and Rhythm:  Normal rate and regular rhythm.     Pulses: Normal pulses.     Heart sounds: Murmur present. Systolic murmur present with a grade of 1/6. No diastolic murmur. No gallop.      Comments: EKG ---  Sinus  Rhythm  WITHIN NORMAL LIMITS Pulmonary:     Effort: Pulmonary effort is normal.     Breath sounds: No stridor. No wheezing, rhonchi or rales.  Abdominal:     General: Abdomen is flat. Bowel sounds are normal. There is no distension.     Palpations: There is no hepatomegaly or splenomegaly.     Tenderness: There is no abdominal tenderness.     Hernia: No hernia is present.  Musculoskeletal:     Right lower  leg: No edema.     Left lower leg: No edema.  Skin:    General: Skin is warm and dry.  Neurological:     General: No focal deficit present.     Mental Status: She is alert and oriented to person, place, and time.  Psychiatric:        Mood and Affect: Mood normal.        Behavior: Behavior normal.     Lab Results  Component Value Date   WBC 5.8 02/16/2019   HGB 12.7 02/16/2019   HCT 37.7 02/16/2019   PLT 175.0 02/16/2019   GLUCOSE 107 (H) 02/16/2019   CHOL 162 02/16/2019   TRIG 95.0 02/16/2019   HDL 60.50 02/16/2019   LDLDIRECT 92.1 03/23/2013   LDLCALC 83 02/16/2019   ALT 60 (H) 02/16/2019   AST 66 (H) 02/16/2019   NA 136 02/16/2019   K 3.7 02/16/2019   CL 102 02/16/2019   CREATININE 0.50 02/16/2019   BUN 19 02/16/2019   CO2 25 02/16/2019   TSH 1.32 02/16/2019   HGBA1C 5.7 02/16/2019    Mm 3d Screen Breast Bilateral  Result Date: 08/10/2018 CLINICAL DATA:  Screening. EXAM: DIGITAL SCREENING BILATERAL MAMMOGRAM WITH TOMO AND CAD COMPARISON:  Previous exam(s). ACR Breast Density Category b: There are scattered areas of fibroglandular density. FINDINGS: There are no findings suspicious for malignancy. Images were processed with CAD. IMPRESSION: No mammographic evidence of malignancy. A result letter of this screening mammogram will be mailed directly to the patient. RECOMMENDATION: Screening mammogram in one year. (Code:SM-B-01Y) BI-RADS CATEGORY  1: Negative. Electronically Signed   By: Abelardo Diesel M.D.   On: 08/10/2018 08:30    Assessment & Plan:   Rashena was seen today for annual exam and hypertension.  Diagnoses and all orders for this visit:  Routine general medical examination at a health care facility- Exam completed, labs reviewed, she refused a flu vaccine today, mammogram/PAP/colon cancer screening are all up-to-date, patient education was given. -     Lipid panel; Future  Vitamin D deficiency- Her vitamin D level is normal now. -     VITAMIN D 25 Hydroxy  (Vit-D Deficiency, Fractures); Future  Elevated ferritin- Her ferritin and iron level continue to be mildly elevated.  I will bring her back soon to screen for hemochromatosis.  In the meantime I will see what shows up on the MRI of her abdomen. -     Ferritin; Future -     IBC panel; Future -     Hepatic function panel; Future  Hyperglycemia- She has very mild prediabetes.  This does not need to be treated with a medication. -     Basic metabolic panel; Future -  Hemoglobin A1c; Future  Essential hypertension- Her blood pressure is not adequately well controlled due to noncompliance.  Her EKG is negative for LVH or ischemia.  Labs are negative for secondary causes or endorgan damage.  I have asked her to restart nebivolol and chlorthalidone. -     CBC with Differential/Platelet; Future -     TSH; Future -     EKG 12-Lead -     chlorthalidone (HYGROTON) 25 MG tablet; Take 1 tablet (25 mg total) by mouth daily. -     nebivolol (BYSTOLIC) 5 MG tablet; Take 1 tablet (5 mg total) by mouth daily. -     potassium chloride (K-DUR) 10 MEQ tablet; Take 1 tablet (10 mEq total) by mouth daily.  Need for influenza vaccination -     Flu Vaccine QUAD 36+ mos IM  Cardiac murmur- Echocardiogram 4 years ago was negative for valvular heart disease.  She is asymptomatic with this and her EKG is normal.  I do not think she has significant valvular disease. -     EKG 54-GBEE  Systolic murmur  Elevated LFTs- She has a mildly elevated ALT, AST, and alk phos.  I have asked her to undergo an MRI of her liver to screen for HCC, obstructive process, infiltrating process, cirrhosis, and NASH. -     Cancel: US Abdomen Limited RUQ; Future -     MR Abdomen W Wo Contrast; Future  Hyperlipidemia with target LDL less than 130- She has an elevated ASCVD risk score so I have asked her to start taking a statin for CV risk reduction. -     rosuvastatin (CRESTOR) 5 MG tablet; Take 1 tablet (5 mg total) by mouth daily.   Alkaline phosphatase elevation -     MR Abdomen W Wo Contrast; Future   I have discontinued Jamaiyah L. Bellamy's gabapentin and traMADol. I am also having her start on rosuvastatin. Additionally, I am having her maintain her loratadine, aspirin EC, cyclobenzaprine, Vitamin D (Ergocalciferol), chlorthalidone, nebivolol, and potassium chloride.  Meds ordered this encounter  Medications  . chlorthalidone (HYGROTON) 25 MG tablet    Sig: Take 1 tablet (25 mg total) by mouth daily.    Dispense:  90 tablet    Refill:  0  . nebivolol (BYSTOLIC) 5 MG tablet    Sig: Take 1 tablet (5 mg total) by mouth daily.    Dispense:  84 tablet    Refill:  0  . potassium chloride (K-DUR) 10 MEQ tablet    Sig: Take 1 tablet (10 mEq total) by mouth daily.    Dispense:  30 tablet    Refill:  3  . rosuvastatin (CRESTOR) 5 MG tablet    Sig: Take 1 tablet (5 mg total) by mouth daily.    Dispense:  90 tablet    Refill:  1     Follow-up: Return in about 4 weeks (around 03/16/2019).  [name], MD

## 2019-03-17 ENCOUNTER — Encounter: Payer: Self-pay | Admitting: Internal Medicine

## 2019-03-17 ENCOUNTER — Ambulatory Visit
Admission: RE | Admit: 2019-03-17 | Discharge: 2019-03-17 | Disposition: A | Discharge status: Home / Self Care | Payer: 59 | Source: Ambulatory Visit | Attending: Internal Medicine | Admitting: Internal Medicine

## 2019-03-17 DIAGNOSIS — R7989 Other specified abnormal findings of blood chemistry: Secondary | ICD-10-CM

## 2019-03-17 DIAGNOSIS — R748 Abnormal levels of other serum enzymes: Secondary | ICD-10-CM

## 2019-03-17 DIAGNOSIS — K7460 Unspecified cirrhosis of liver: Secondary | ICD-10-CM | POA: Diagnosis not present

## 2019-03-17 MED ORDER — GADOBENATE DIMEGLUMINE 529 MG/ML IV SOLN
18.0000 mL | Freq: Once | INTRAVENOUS | Status: AC | PRN
  Administered 2019-03-17: 18 mL via INTRAVENOUS

## 2019-04-06 ENCOUNTER — Other Ambulatory Visit: Payer: Self-pay

## 2019-04-06 ENCOUNTER — Other Ambulatory Visit (INDEPENDENT_AMBULATORY_CARE_PROVIDER_SITE_OTHER): Payer: 59

## 2019-04-06 ENCOUNTER — Ambulatory Visit (INDEPENDENT_AMBULATORY_CARE_PROVIDER_SITE_OTHER): Payer: 59 | Admitting: Internal Medicine

## 2019-04-06 ENCOUNTER — Encounter: Payer: Self-pay | Admitting: Internal Medicine

## 2019-04-06 VITALS — BP 210/102 | HR 67 | Temp 98.2°F | Ht 66.0 in | Wt 193.5 lb

## 2019-04-06 DIAGNOSIS — K7460 Unspecified cirrhosis of liver: Secondary | ICD-10-CM | POA: Diagnosis not present

## 2019-04-06 DIAGNOSIS — K7581 Nonalcoholic steatohepatitis (NASH): Secondary | ICD-10-CM

## 2019-04-06 DIAGNOSIS — I10 Essential (primary) hypertension: Secondary | ICD-10-CM

## 2019-04-06 LAB — BASIC METABOLIC PANEL
BUN: 19 mg/dL (ref 6–23)
CO2: 25 mEq/L (ref 19–32)
Calcium: 9.9 mg/dL (ref 8.4–10.5)
Chloride: 105 mEq/L (ref 96–112)
Creatinine, Ser: 0.51 mg/dL (ref 0.40–1.20)
GFR: 146.8 mL/min (ref >60.00)
Glucose, Bld: 110 mg/dL — ABNORMAL HIGH (ref 70–99)
Potassium: 3.9 mEq/L (ref 3.5–5.1)
Sodium: 138 mEq/L (ref 135–145)

## 2019-04-06 LAB — PROTIME-INR
INR: 1.1 ratio — ABNORMAL HIGH (ref 0.8–1.0)
Prothrombin Time: 13.2 s — ABNORMAL HIGH (ref 9.6–13.1)

## 2019-04-06 MED ORDER — BIDIL 20-37.5 MG PO TABS: 1.0000 | ORAL_TABLET | Freq: Three times a day (TID) | ORAL | 0 refills | Status: DC

## 2019-04-06 NOTE — Patient Instructions (Signed)

## 2019-04-06 NOTE — Progress Notes (Signed)
Subjective:  Patient ID: Kimberly Pierce, female    DOB: 1955/07/03  Age: 63 y.o. MRN: [id number]  CC: Hypertension   HPI [name] presents for f/up - Since I last saw her she has undergone an MRI of her liver for evaluation of elevated liver enzymes.  The findings are consistent with cirrhosis.  She tells me she has never consumed much alcohol.  Screening for viral hepatitis has been negative.  Her ferritin and iron levels were mildly elevated.  She tells me she has been taking chlorthalidone and nebivolol for blood pressure control.  She has not been checking her blood pressure at home.  She denies headache, blurred vision, chest pain, shortness of breath, or edema.  Outpatient Medications Prior to Visit  Medication Sig Dispense Refill   chlorthalidone (HYGROTON) 25 MG tablet Take 1 tablet (25 mg total) by mouth daily. 90 tablet 0   cyclobenzaprine (FEXMID) 7.5 MG tablet   0   nebivolol (BYSTOLIC) 5 MG tablet Take 1 tablet (5 mg total) by mouth daily. 84 tablet 0   potassium chloride (K-DUR) 10 MEQ tablet Take 1 tablet (10 mEq total) by mouth daily. 30 tablet 3   rosuvastatin (CRESTOR) 5 MG tablet Take 1 tablet (5 mg total) by mouth daily. 90 tablet 1   Vitamin D, Ergocalciferol, (DRISDOL) 1.25 MG (50000 UT) CAPS capsule TAKE 1 CAPSULE BY MOUTH EVERY 7 DAYS 12 capsule 0   aspirin EC 81 MG tablet Take 1 tablet (81 mg total) by mouth daily. 90 tablet 11   loratadine (CLARITIN) 10 MG tablet Take 10 mg by mouth daily as needed.       No facility-administered medications prior to visit.     ROS Review of Systems  Constitutional: Negative for appetite change, diaphoresis and fatigue.  HENT: Negative.   Eyes: Negative for visual disturbance.  Respiratory: Negative for cough, chest tightness, shortness of breath and wheezing.   Cardiovascular: Negative for chest pain, palpitations and leg swelling.  Gastrointestinal: Negative for abdominal pain, diarrhea and vomiting.    Endocrine: Negative.   Genitourinary: Negative.  Negative for difficulty urinating.  Musculoskeletal: Negative.  Negative for myalgias.  Skin: Negative.  Negative for color change.  Neurological: Negative.  Negative for dizziness, weakness and headaches.  Hematological: Negative for adenopathy. Does not bruise/bleed easily.  Psychiatric/Behavioral: Negative.     Objective:  BP (!) 210/102 (BP Location: Left Arm, Patient Position: Sitting, Cuff Size: Normal)    Pulse 67    Temp 98.2 F (36.8 C) (Oral)    Ht 5' 6"  (1.676 m)    Wt 193 lb 8 oz (87.8 kg)    SpO2 98%    BMI 31.23 kg/m   BP Readings from Last 3 Encounters:  04/06/19 (!) 210/102  02/16/19 (!) 172/106  08/06/17 128/80    Wt Readings from Last 3 Encounters:  04/06/19 193 lb 8 oz (87.8 kg)  02/16/19 191 lb (86.6 kg)  08/06/17 197 lb 4 oz (89.5 kg)    Physical Exam Vitals signs reviewed.  Constitutional:      Appearance: Normal appearance. She is obese. She is not ill-appearing.  HENT:     Nose: Nose normal.     Mouth/Throat:     Pharynx: Oropharynx is clear.  Eyes:     General: No scleral icterus.    Conjunctiva/sclera: Conjunctivae normal.  Neck:     Musculoskeletal: Normal range of motion and neck supple.  Cardiovascular:     Rate and Rhythm:  Normal rate and regular rhythm.     Heart sounds: No murmur.  Pulmonary:     Effort: Pulmonary effort is normal.     Breath sounds: No stridor. No wheezing, rhonchi or rales.  Abdominal:     General: Abdomen is protuberant. Bowel sounds are normal. There is no distension.     Palpations: Abdomen is soft. There is no fluid wave, hepatomegaly or splenomegaly.     Tenderness: There is no abdominal tenderness.     Hernia: No hernia is present.  Musculoskeletal: Normal range of motion.     Right lower leg: No edema.     Left lower leg: No edema.  Lymphadenopathy:     Cervical: No cervical adenopathy.  Skin:    General: Skin is warm and dry.     Coloration: Skin is  not pale.  Neurological:     General: No focal deficit present.     Mental Status: She is alert.     Lab Results  Component Value Date   WBC 5.8 02/16/2019   HGB 12.7 02/16/2019   HCT 37.7 02/16/2019   PLT 175.0 02/16/2019   GLUCOSE 110 (H) 04/06/2019   CHOL 162 02/16/2019   TRIG 95.0 02/16/2019   HDL 60.50 02/16/2019   LDLDIRECT 92.1 03/23/2013   LDLCALC 83 02/16/2019   ALT 60 (H) 02/16/2019   AST 66 (H) 02/16/2019   NA 138 04/06/2019   K 3.9 04/06/2019   CL 105 04/06/2019   CREATININE 0.51 04/06/2019   BUN 19 04/06/2019   CO2 25 04/06/2019   TSH 1.32 02/16/2019   INR 1.1 (H) 04/06/2019   HGBA1C 5.7 02/16/2019    Mr Abdomen W Wo Contrast  Result Date: 03/17/2019 CLINICAL DATA:  Elevated liver function tests and alkaline phosphatase. Suspected liver fibrosis. EXAM: MRI ABDOMEN WITHOUT AND WITH CONTRAST TECHNIQUE: Multiplanar multisequence MR imaging of the abdomen was performed both before and after the administration of intravenous contrast. CONTRAST:  21m MULTIHANCE GADOBENATE DIMEGLUMINE 529 MG/ML IV SOLN COMPARISON:  None. FINDINGS: Lower chest: No acute findings. Hepatobiliary: Mild hypertrophy of left lobe and capsular nodularity is consistent with cirrhosis. No liver masses are identified. Gallbladder is unremarkable. No evidence of biliary ductal dilatation. Pancreas:  No mass or inflammatory changes. Spleen:  Within normal limits in size and appearance. Adrenals/Urinary Tract: No masses identified. Tiny right renal cyst noted. No evidence of hydronephrosis. Stomach/Bowel: Visualized portion unremarkable. Vascular/Lymphatic: No pathologically enlarged lymph nodes identified. No abdominal aortic aneurysm. Other:  No evidence of ascites Musculoskeletal:  No suspicious bone lesions identified. IMPRESSION: Hepatic cirrhosis. No evidence of hepatic neoplasm, ascites, or other acute findings. Electronically Signed   By: JMarlaine HindM.D.   On: 03/17/2019 15:39    Assessment &  Plan:   AKenetrawas seen today for hypertension.  Diagnoses and all orders for this visit:  Liver cirrhosis secondary to NASH ([facility]- I have asked her to see GI for a second opinion about the diagnosis and the treatment for this.  I am concerned this is the end sequelae of NASH but she also has an elevated iron and ferritin so I will screen her for hemochromatosis.  I will check to see that she is immune against hep A and B. -     Hepatitis B surface antibody,quantitative; Future -     Hepatitis A antibody, total; Future -     Ambulatory referral to Gastroenterology -     Protime-INR; Future -     Hemochromatosis DNA-PCR(c282y,h63d);  Future  Essential hypertension- Her blood pressure is not adequately well controlled on the current regimen.  I have asked her to add isosorbide and hydralazine to her current regimen. -     isosorbide-hydrALAZINE (BIDIL) 20-37.5 MG tablet; Take 1 tablet by mouth 3 (three) times daily. -     Basic metabolic panel; Future   I have discontinued Lashe L. Pavel's loratadine and aspirin EC. I am also having her start on BiDil. Additionally, I am having her maintain her cyclobenzaprine, chlorthalidone, nebivolol, potassium chloride, rosuvastatin, and Vitamin D (Ergocalciferol).  Meds ordered this encounter  Medications   isosorbide-hydrALAZINE (BIDIL) 20-37.5 MG tablet    Sig: Take 1 tablet by mouth 3 (three) times daily.    Dispense:  288 tablet    Refill:  0     Follow-up: Return in about 3 weeks (around 04/27/2019).  [name], MD

## 2019-04-11 ENCOUNTER — Encounter: Payer: Self-pay | Admitting: Nurse Practitioner

## 2019-04-14 LAB — HEMOCHROMATOSIS DNA-PCR(C282Y,H63D)

## 2019-04-14 LAB — HEPATITIS B SURFACE ANTIBODY, QUANTITATIVE: Hep B S AB Quant (Post): 47 m[IU]/mL (ref >=10)

## 2019-04-14 LAB — HEPATITIS A ANTIBODY, TOTAL: Hepatitis A AB,Total: REACTIVE — AB

## 2019-04-27 ENCOUNTER — Ambulatory Visit: Payer: 59 | Admitting: Internal Medicine

## 2019-05-02 ENCOUNTER — Ambulatory Visit: Payer: 59 | Admitting: Nurse Practitioner

## 2019-05-03 ENCOUNTER — Ambulatory Visit (INDEPENDENT_AMBULATORY_CARE_PROVIDER_SITE_OTHER): Payer: 59 | Admitting: Nurse Practitioner

## 2019-05-03 ENCOUNTER — Other Ambulatory Visit (INDEPENDENT_AMBULATORY_CARE_PROVIDER_SITE_OTHER): Payer: 59

## 2019-05-03 ENCOUNTER — Encounter: Payer: Self-pay | Admitting: Nurse Practitioner

## 2019-05-03 ENCOUNTER — Other Ambulatory Visit: Payer: Self-pay

## 2019-05-03 VITALS — BP 190/90 | HR 64 | Temp 96.9°F | Ht 66.0 in | Wt 190.1 lb

## 2019-05-03 DIAGNOSIS — R748 Abnormal levels of other serum enzymes: Secondary | ICD-10-CM | POA: Diagnosis not present

## 2019-05-03 DIAGNOSIS — K7469 Other cirrhosis of liver: Secondary | ICD-10-CM | POA: Diagnosis not present

## 2019-05-03 LAB — HEPATIC FUNCTION PANEL
ALT: 56 U/L — ABNORMAL HIGH (ref 0–35)
AST: 65 U/L — ABNORMAL HIGH (ref 0–37)
Albumin: 4.3 g/dL (ref 3.5–5.2)
Alkaline Phosphatase: 123 U/L — ABNORMAL HIGH (ref 39–117)
Bilirubin, Direct: 0.2 mg/dL (ref 0.0–0.3)
Total Bilirubin: 0.7 mg/dL (ref 0.2–1.2)
Total Protein: 8.5 g/dL — ABNORMAL HIGH (ref 6.0–8.3)

## 2019-05-03 LAB — IGA: IgA: 523 mg/dL — ABNORMAL HIGH (ref 68–378)

## 2019-05-03 NOTE — Patient Instructions (Addendum)
If you are age 64 or older, your body mass index should be between 23-30. Your Body mass index is 30.69 kg/m. If this is out of the aforementioned range listed, please consider follow up with your Primary Care Provider.  If you are age 77 or younger, your body mass index should be between 19-25. Your Body mass index is 30.69 kg/m. If this is out of the aformentioned range listed, please consider follow up with your Primary Care Provider.   Your provider has requested that you go to the basement level for lab work before leaving today. Press "B" on the elevator. The lab is located at the first door on the left as you exit the elevator.  Reduce carbohydrate intake, ie: breads, pasta  Exercise 45 minutes 3 times a week.  We will need to schedule you for an abdominal ultrasound 09/2019  Please call to schedule your 3 month follow up with Dr Hilarie Fredrickson.  Thank you for choosing me and [facility]

## 2019-05-03 NOTE — Progress Notes (Addendum)
05/03/2019 LEMOYNE Pierce [id number] 1956-02-07   HISTORY OF PRESENT ILLNESS: Kimberly Pierce is a very pleasant 63 year old female with a past medical history of hypertension, hyperlipidemia, pre-diabetes and a LE DVT at the age of 20 (details unclear). Past Hysterectomy.  She underwent a routine physical exam and laboratory studies by Dr. [name] September 2020 which identified elevated LFTs. An abdominal MRI done 03/17/2019 which identified cirrhosis without ascites. She presents today for further evaluation regarding newly diagnosed cirrhosis.  Most likely has NASH cirrhosis.  She denies having any prior knowledge of having liver disease.  02/16/2019: AST 66.  ALT 60.  Alk phos 124. Total bili 1.3.  Direct bili 0.3. Iron 151.  Ferritin 569.1.   WBC 5.8.  Hemoglobin 12.7.  Hematocrit 37.7.  MCV 99.7.  Platelet 175.  TSH 1.32.  04/06/2019: PT 13.2.  INR 1.1.  Hepatitis A total antibody reactive.  Hepatitis B post 47 iu/ml which indicates immunity. Hemochromatosis DNA negative for C282Y and H63D mutations which indicates low risk for hemochromatosis.  05/04/2017 hepatitis A antibody total nonreactive.  Hepatitis B surface antibody nonreactive.  Hepatitis B core total antibody nonreactive. Hep B surface antigen not done. HIV nonreactive.  She has received hepatitis A and hepatitis B vaccinations.  Abdominal MRI with contrast 03/17/2019: Lower chest: No acute findings. Hepatobiliary: Mild hypertrophy of left lobe and capsular nodularity is consistent with cirrhosis. No liver masses are identified. Gallbladder is unremarkable. No evidence of biliary ductal dilatation. Pancreas:  No mass or inflammatory changes. Spleen:  Within normal limits in size and appearance. Adrenals/Urinary Tract: No masses identified. Tiny right renal cyst noted. No evidence of hydronephrosis. Stomach/Bowel: Visualized portion unremarkable. Vascular/Lymphatic: No pathologically enlarged lymph nodes  identified. No abdominal aortic aneurysm. Other:  No evidence of ascites Musculoskeletal:  No suspicious bone lesions identified.  She denies any history of alcohol or drug use.  Never received a blood transfusion. No new medications or supplements. No alcohol or drug use. She smokes cigarettes. She denies any complaints today.  She reports eating a fairly healthy diet.  She avoids sweets.  She does not drink sodas or sweet tea.  Her carbohydrate intake is limited.  She eats chicken, beef and vegetables.  She denies having any nausea or vomiting.  Her appetite is good.  No jaundice.  No memory issues.  No upper or lower abdominal pain.  She is passing normal formed bowel movement daily.  No rectal bleeding or melena.  No GERD symptoms.  No family history of liver disease.  No family history of upper GI or colorectal cancer.  She underwent a Cologuard test 06/27/2017 which was negative.  She is employed by [employer] services at [employer] for the past 28 years.  Past Medical History:  Diagnosis Date  . DVT (deep venous thrombosis) ([place])   . Hypertension    Past Surgical History:  Procedure Laterality Date  . ABDOMINAL HYSTERECTOMY      reports that she has been smoking. She has never used smokeless tobacco. She reports that she does not drink alcohol or use drugs. family history includes Alcohol abuse in an other family member; Drug abuse in an other family member; Heart attack in her father and mother; Hypertension in an other family member; Thyroid disease in an other family member. Allergies  Allergen Reactions  . Percocet [Oxycodone-Acetaminophen] Rash    Rash with Percocet Rxed in ER 08/28/14       Outpatient Encounter Medications  as of 05/03/2019  Medication Sig  . chlorthalidone (HYGROTON) 25 MG tablet Take 1 tablet (25 mg total) by mouth daily.  . nebivolol (BYSTOLIC) 5 MG tablet Take 1 tablet (5 mg total) by mouth daily.  . potassium chloride (K-DUR) 10  MEQ tablet Take 1 tablet (10 mEq total) by mouth daily.  . rosuvastatin (CRESTOR) 5 MG tablet Take 1 tablet (5 mg total) by mouth daily.  Marland Kitchen VITAMIN D PO Take 1,000 Int'l Units by mouth daily.  . [DISCONTINUED] isosorbide-hydrALAZINE (BIDIL) 20-37.5 MG tablet Take 1 tablet by mouth 3 (three) times daily.  . [DISCONTINUED] Vitamin D, Ergocalciferol, (DRISDOL) 1.25 MG (50000 UT) CAPS capsule TAKE 1 CAPSULE BY MOUTH EVERY 7 DAYS  . [DISCONTINUED] cyclobenzaprine (FEXMID) 7.5 MG tablet    No facility-administered encounter medications on file as of 05/03/2019.      REVIEW OF SYSTEMS  : All other systems reviewed and negative except where noted in the History of Present Illness.   PHYSICAL EXAM: BP (!) 190/90   Pulse 64   Temp (!) 96.9 F (36.1 C)   Ht _0  (1.676 m)   Wt 190 lb 2 oz (86.2 kg)   BMI 30.69 kg/m  General: Well developed  63 year old female in no acute distress. Head: Normocephalic and atraumatic. Eyes:  sclerae nonicteric,conjunctive pink. Ears: Normal auditory acuity. Mouth: Upper dentition absent.  Neck: Supple, no masses.  Lungs: Clear throughout to auscultation. Heart: RRR, 1/6 systolic murmur. Abdomen: Soft, nontender, non distended. No masses, liver border palpated with deep inspiration. No splenomegaly.  No ascites.  Normal bowel sounds x 4 quadrants. Rectal: Deferred. Musculoskeletal: Symmetrical with no gross deformities.  Skin: No lesions on visible extremities. No Jaundice.  Extremities: No edema.  Neurological: Alert oriented x 4, no focal deficits. Cervical Nodes:  No significant cervical adenopathy Psychological:  Alert and cooperative. Normal mood and affect  ASSESSMENT AND PLAN:  80.  63 year old female with newly diagnosed cirrhosis, most likely NASH. Patient is immune to Hep A and Hep B. -Hepatic panel, ANA, SMA, AMA, alpha-1 antitrypsin, ceruloplasmin, hepatitis B surface antigen, celiac panel, IgG, AFP -Recall surveillance abdominal sonogram in  6 months -Discussed eating a healthy diet, reduce carbohydrate intake.  Walk for 45 minutes 3-4 times daily.  Avoid weight gain. -No current evidence of portal hypertension, no ascites or splenomegaly on MRI and platelet count normal but decreasing 175 >> 199.  EGD to survey for esophageal varices deferred for now, to re-evaluate the need for an EGD at the time of her follow-up appointment in 3 months with  Dr. Hilarie Fredrickson -Further recommendations to be determined after the above lab results received.  2.  Colon cancer screening.  Negative Cologuard in 2019. -Discussed scheduling a screening colonoscopy, to further discuss at her follow-up appointment in 3 months with Dr. Hilarie Fredrickson  3. HTN  4. Pre-Diabetes   Addendum: Reviewed and agree with assessment and management plan. To discuss variceal screening EGD at follow-up Pyrtle, Lajuan Lines, MD   ADDENDUM 05/12/2019: IgG elevated and ANA positive. A liver biopsy has been ordered to rule out autoimmune hepatitis.  Patient has consented to have a liver biopsy, benefits and risks discussed with the patient. IgA elevated. SPEP ordered in addition to PT/INR, CBC and CMET.         CC:  Janith Lima, MD

## 2019-05-06 LAB — ANA: Anti Nuclear Antibody (ANA): POSITIVE — AB

## 2019-05-06 LAB — MITOCHONDRIAL ANTIBODIES: Mitochondrial M2 Ab, IgG: <=20 U

## 2019-05-06 LAB — AFP TUMOR MARKER: AFP-Tumor Marker: 5.2 ng/mL

## 2019-05-06 LAB — IGG: IgG (Immunoglobin G), Serum: 1805 mg/dL — ABNORMAL HIGH (ref 600–1540)

## 2019-05-06 LAB — HEPATITIS B SURFACE ANTIBODY,QUALITATIVE: Hep B S Ab: REACTIVE — AB

## 2019-05-06 LAB — ANTI-SMOOTH MUSCLE ANTIBODY, IGG: Actin (Smooth Muscle) Antibody (IGG): <20 U (ref <20)

## 2019-05-06 LAB — ANTI-NUCLEAR AB-TITER (ANA TITER)
ANA TITER: 1:40 {titer} — ABNORMAL HIGH
ANA Titer 1: 1:80 {titer} — ABNORMAL HIGH

## 2019-05-06 LAB — CERULOPLASMIN: Ceruloplasmin: 32 mg/dL (ref 18–53)

## 2019-05-06 LAB — ALPHA-1-ANTITRYPSIN: A-1 Antitrypsin, Ser: 154 mg/dL (ref 83–199)

## 2019-05-06 LAB — TISSUE TRANSGLUTAMINASE, IGA: (tTG) Ab, IgA: 1 U/mL

## 2019-05-12 ENCOUNTER — Other Ambulatory Visit: Payer: Self-pay

## 2019-05-12 DIAGNOSIS — R748 Abnormal levels of other serum enzymes: Secondary | ICD-10-CM

## 2019-05-12 DIAGNOSIS — K7469 Other cirrhosis of liver: Secondary | ICD-10-CM

## 2019-05-16 ENCOUNTER — Other Ambulatory Visit (INDEPENDENT_AMBULATORY_CARE_PROVIDER_SITE_OTHER): Payer: 59

## 2019-05-16 DIAGNOSIS — K7469 Other cirrhosis of liver: Secondary | ICD-10-CM | POA: Diagnosis not present

## 2019-05-16 DIAGNOSIS — R748 Abnormal levels of other serum enzymes: Secondary | ICD-10-CM

## 2019-05-16 LAB — COMPREHENSIVE METABOLIC PANEL
ALT: 40 U/L — ABNORMAL HIGH (ref 0–35)
AST: 46 U/L — ABNORMAL HIGH (ref 0–37)
Albumin: 4.4 g/dL (ref 3.5–5.2)
Alkaline Phosphatase: 144 U/L — ABNORMAL HIGH (ref 39–117)
BUN: 16 mg/dL (ref 6–23)
CO2: 19 mEq/L (ref 19–32)
Calcium: 9.7 mg/dL (ref 8.4–10.5)
Chloride: 105 mEq/L (ref 96–112)
Creatinine, Ser: 0.52 mg/dL (ref 0.40–1.20)
GFR: 143.5 mL/min (ref >60.00)
Glucose, Bld: 132 mg/dL — ABNORMAL HIGH (ref 70–99)
Potassium: 4 mEq/L (ref 3.5–5.1)
Sodium: 136 mEq/L (ref 135–145)
Total Bilirubin: 0.6 mg/dL (ref 0.2–1.2)
Total Protein: 8 g/dL (ref 6.0–8.3)

## 2019-05-16 LAB — CBC WITH DIFFERENTIAL/PLATELET
Basophils Absolute: 0.1 10*3/uL (ref 0.0–0.1)
Basophils Relative: 1.4 % (ref 0.0–3.0)
Eosinophils Absolute: 0.3 10*3/uL (ref 0.0–0.7)
Eosinophils Relative: 6.1 % — ABNORMAL HIGH (ref 0.0–5.0)
HCT: 37.4 % (ref 36.0–46.0)
Hemoglobin: 12.5 g/dL (ref 12.0–15.0)
Lymphocytes Relative: 41 % (ref 12.0–46.0)
Lymphs Abs: 1.9 10*3/uL (ref 0.7–4.0)
MCHC: 33.3 g/dL (ref 30.0–36.0)
MCV: 99 fl (ref 78.0–100.0)
Monocytes Absolute: 0.4 10*3/uL (ref 0.1–1.0)
Monocytes Relative: 8.2 % (ref 3.0–12.0)
Neutro Abs: 2 10*3/uL (ref 1.4–7.7)
Neutrophils Relative %: 43.3 % (ref 43.0–77.0)
Platelets: 198 10*3/uL (ref 150.0–400.0)
RBC: 3.78 Mil/uL — ABNORMAL LOW (ref 3.87–5.11)
RDW: 13.2 % (ref 11.5–15.5)
WBC: 4.6 10*3/uL (ref 4.0–10.5)

## 2019-05-16 LAB — PROTIME-INR
INR: 1.1 ratio — ABNORMAL HIGH (ref 0.8–1.0)
Prothrombin Time: 13.1 s (ref 9.6–13.1)

## 2019-05-18 LAB — PROTEIN ELECTROPHORESIS, SERUM
Albumin ELP: 4.3 g/dL (ref 3.8–4.8)
Alpha 1: 0.3 g/dL (ref 0.2–0.3)
Alpha 2: 0.8 g/dL (ref 0.5–0.9)
Beta 2: 0.6 g/dL — ABNORMAL HIGH (ref 0.2–0.5)
Beta Globulin: 0.4 g/dL (ref 0.4–0.6)
Gamma Globulin: 1.7 g/dL (ref 0.8–1.7)
Total Protein: 8.1 g/dL (ref 6.1–8.1)

## 2019-05-23 ENCOUNTER — Other Ambulatory Visit: Payer: Self-pay | Admitting: Radiology

## 2019-05-24 ENCOUNTER — Other Ambulatory Visit: Payer: Self-pay

## 2019-05-24 ENCOUNTER — Ambulatory Visit (HOSPITAL_COMMUNITY)
Admission: RE | Admit: 2019-05-24 | Discharge: 2019-05-24 | Disposition: A | Discharge status: Home / Self Care | Payer: 59 | Source: Ambulatory Visit | Attending: Nurse Practitioner | Admitting: Nurse Practitioner

## 2019-05-24 ENCOUNTER — Encounter (HOSPITAL_COMMUNITY): Payer: Self-pay

## 2019-05-24 DIAGNOSIS — R7989 Other specified abnormal findings of blood chemistry: Secondary | ICD-10-CM | POA: Insufficient documentation

## 2019-05-24 DIAGNOSIS — I10 Essential (primary) hypertension: Secondary | ICD-10-CM | POA: Diagnosis not present

## 2019-05-24 DIAGNOSIS — R945 Abnormal results of liver function studies: Secondary | ICD-10-CM | POA: Diagnosis not present

## 2019-05-24 DIAGNOSIS — R748 Abnormal levels of other serum enzymes: Secondary | ICD-10-CM | POA: Insufficient documentation

## 2019-05-24 DIAGNOSIS — E785 Hyperlipidemia, unspecified: Secondary | ICD-10-CM | POA: Diagnosis not present

## 2019-05-24 DIAGNOSIS — K7460 Unspecified cirrhosis of liver: Secondary | ICD-10-CM | POA: Insufficient documentation

## 2019-05-24 DIAGNOSIS — F17200 Nicotine dependence, unspecified, uncomplicated: Secondary | ICD-10-CM | POA: Insufficient documentation

## 2019-05-24 DIAGNOSIS — K7469 Other cirrhosis of liver: Secondary | ICD-10-CM | POA: Insufficient documentation

## 2019-05-24 DIAGNOSIS — Z79899 Other long term (current) drug therapy: Secondary | ICD-10-CM | POA: Insufficient documentation

## 2019-05-24 DIAGNOSIS — Z8249 Family history of ischemic heart disease and other diseases of the circulatory system: Secondary | ICD-10-CM | POA: Insufficient documentation

## 2019-05-24 DIAGNOSIS — Z86718 Personal history of other venous thrombosis and embolism: Secondary | ICD-10-CM | POA: Insufficient documentation

## 2019-05-24 LAB — PROTIME-INR
INR: 1 (ref 0.8–1.2)
Prothrombin Time: 13.1 seconds (ref 11.4–15.2)

## 2019-05-24 LAB — CBC
HCT: 41 % (ref 36.0–46.0)
Hemoglobin: 13.3 g/dL (ref 12.0–15.0)
MCH: 32.8 pg (ref 26.0–34.0)
MCHC: 32.4 g/dL (ref 30.0–36.0)
MCV: 101 fL — ABNORMAL HIGH (ref 80.0–100.0)
Platelets: 190 10*3/uL (ref 150–400)
RBC: 4.06 MIL/uL (ref 3.87–5.11)
RDW: 13.1 % (ref 11.5–15.5)
WBC: 6.6 10*3/uL (ref 4.0–10.5)
nRBC: 0 % (ref 0.0–0.2)

## 2019-05-24 LAB — APTT: aPTT: 29 seconds (ref 24–36)

## 2019-05-24 MED ORDER — FENTANYL CITRATE (PF) 100 MCG/2ML IJ SOLN
INTRAMUSCULAR | Status: AC | PRN
  Administered 2019-05-24 (×2): 50 ug via INTRAVENOUS

## 2019-05-24 MED ORDER — FENTANYL CITRATE (PF) 100 MCG/2ML IJ SOLN
INTRAMUSCULAR | Status: AC
  Filled 2019-05-24: qty 2

## 2019-05-24 MED ORDER — LIDOCAINE HCL (PF) 1 % IJ SOLN
INTRAMUSCULAR | Status: AC | PRN
  Administered 2019-05-24: 10 mL

## 2019-05-24 MED ORDER — MIDAZOLAM HCL 2 MG/2ML IJ SOLN
INTRAMUSCULAR | Status: AC
  Filled 2019-05-24: qty 2

## 2019-05-24 MED ORDER — MIDAZOLAM HCL 2 MG/2ML IJ SOLN
INTRAMUSCULAR | Status: AC | PRN
  Administered 2019-05-24 (×2): 1 mg via INTRAVENOUS

## 2019-05-24 MED ORDER — SODIUM CHLORIDE 0.9 % IV SOLN: INTRAVENOUS | Status: DC

## 2019-05-24 MED ORDER — GELATIN ABSORBABLE 12-7 MM EX MISC
CUTANEOUS | Status: AC
  Filled 2019-05-24: qty 1

## 2019-05-24 MED ORDER — LIDOCAINE HCL 1 % IJ SOLN
INTRAMUSCULAR | Status: AC
  Filled 2019-05-24: qty 20

## 2019-05-24 NOTE — H&P (Signed)
Chief Complaint: Patient was seen in consultation today for a random liver biopsy.  Referring Physician(s): Noralyn Pick  Supervising Physician: Aletta Edouard  Patient Status: [facility] - Out-pt  History of Present Illness: Kimberly Pierce is a 63 y.o. female with a past medical history significant for HTN, HLD, remote history of DVT not on anti-coagulation and cirrhosis followed by GI who presents today for a random liver biopsy. Kimberly Pierce was seen for a routine physical exam in September of this year and was noted to have newly elevated LFTs. She underwent an MRI abd with and w/o contrast on 10/8 which noted hepatic cirrhosis without other acute findings. She was seen by GI on 11/24 and further workup showed elevated IgG (1,805) and positive ANA. IR has been asked to perform a random liver biopsy to further evaluate possible autoimmune hepatitis.  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 She has been in her usual state of health. She states understanding of the requested procedure and wishes to proceed as planned.   Past Medical History:  Diagnosis Date  . DVT (deep venous thrombosis) ([place])   . Hypertension     Past Surgical History:  Procedure Laterality Date  . ABDOMINAL HYSTERECTOMY      Allergies: Percocet [oxycodone-acetaminophen]  Medications: Prior to Admission medications   Medication Sig Start Date End Date Taking? Authorizing Provider  chlorthalidone (HYGROTON) 25 MG tablet Take 1 tablet (25 mg total) by mouth daily. 02/16/19   Janith Lima, MD  nebivolol (BYSTOLIC) 5 MG tablet Take 1 tablet (5 mg total) by mouth daily. 02/16/19   Janith Lima, MD  potassium chloride (K-DUR) 10 MEQ tablet Take 1 tablet (10 mEq total) by mouth daily. 02/16/19   Janith Lima, MD    rosuvastatin (CRESTOR) 5 MG tablet Take 1 tablet (5 mg total) by mouth daily. 02/16/19   Janith Lima, MD  VITAMIN D PO Take 1,000 Int'l Units by mouth daily.    [provider]     Family History  Problem Relation Age of Onset  . Heart attack Father   . Heart attack Mother   . Alcohol abuse Other   . Drug abuse Other   . Hypertension Other   . Thyroid disease Other   . Colon cancer Neg Hx     Social History   Socioeconomic History  . Marital status: Legally Separated    Spouse name: Not on file  . Number of children: 3  . Years of education: Not on file  . Highest education level: Not on file  Occupational History  . Not on file  Tobacco Use  . Smoking status: Current Some Day Smoker  . Smokeless tobacco: Never Used  Substance and Sexual Activity  . Alcohol use: No    Comment: occaisional  . Drug use: No  . Sexual activity: Yes    Birth control/protection: Surgical  Other Topics Concern  . Not on file  Social History Narrative  . Not on file   Social Determinants of Health   Financial Resource Strain:   . Difficulty of Paying Living Expenses: Not on file  Food Insecurity:   . Worried About Charity fundraiser in the Last Year: Not on file  . Ran Out of Food in the Last  Year: Not on file  Transportation Needs:   . Lack of Transportation (Medical): Not on file  . Lack of Transportation (Non-Medical): Not on file  Physical Activity:   . Days of Exercise per Week: Not on file  . Minutes of Exercise per Session: Not on file  Stress:   . Feeling of Stress : Not on file  Social Connections:   . Frequency of Communication with Friends and Family: Not on file  . Frequency of Social Gatherings with Friends and Family: Not on file  . Attends Religious Services: Not on file  . Active Member of Clubs or Organizations: Not on file  . Attends Archivist Meetings: Not on file  . Marital Status: Not on file     Review of Systems: A 12 point ROS  discussed and pertinent positives are indicated in the HPI above.  All other systems are negative.  Review of Systems  Constitutional: Negative for appetite change, chills and fever.  Respiratory: Negative for cough and shortness of breath.   Cardiovascular: Negative for chest pain.  Gastrointestinal: Negative for abdominal pain, blood in stool, diarrhea, nausea and vomiting.  Skin: Negative for rash and wound.  Neurological: Negative for dizziness and headaches.    Vital Signs: Ht 5' 6"  (1.676 m)   Wt 190 lb (86.2 kg)   BMI 30.67 kg/m   Physical Exam Vitals reviewed.  Constitutional:      General: She is not in acute distress. HENT:     Head: Normocephalic.     Mouth/Throat:     Mouth: Mucous membranes are moist.     Pharynx: Oropharynx is clear. No oropharyngeal exudate or posterior oropharyngeal erythema.  Eyes:     General: No scleral icterus. Cardiovascular:     Rate and Rhythm: Normal rate and regular rhythm.  Pulmonary:     Effort: Pulmonary effort is normal.     Breath sounds: Normal breath sounds.  Abdominal:     General: Bowel sounds are normal. There is no distension.     Palpations: Abdomen is soft.     Tenderness: There is no abdominal tenderness.  Skin:    General: Skin is warm and dry.     Coloration: Skin is not jaundiced.  Neurological:     Mental Status: She is alert and oriented to person, place, and time.  Psychiatric:        Mood and Affect: Mood normal.        Behavior: Behavior normal.        Thought Content: Thought content normal.        Judgment: Judgment normal.      MD Evaluation Airway: WNL Heart: WNL Abdomen: WNL Chest/ Lungs: WNL ASA  Classification: 2 Mallampati/Airway Score: One   Imaging: No results found.  Labs:  CBC: Recent Labs    02/16/19 1207 05/16/19 0856  WBC 5.8 4.6  HGB 12.7 12.5  HCT 37.7 37.4  PLT 175.0 198.0    COAGS: Recent Labs    04/06/19 1151 05/16/19 0856  INR 1.1* 1.1*     BMP: Recent Labs    02/16/19 1207 04/06/19 1151 05/16/19 0856  NA 136 138 136  K 3.7 3.9 4.0  CL 102 105 105  CO2 25 25 19   GLUCOSE 107* 110* 132*  BUN 19 19 16   CALCIUM 9.5 9.9 9.7  CREATININE 0.50 0.51 0.52    LIVER FUNCTION TESTS: Recent Labs    02/16/19 1207 05/03/19 1308 05/16/19 0856  BILITOT 1.3*  0.7 0.6  AST 66* 65* 46*  ALT 60* 56* 40*  ALKPHOS 124* 123* 144*  PROT 8.2 8.5* 8.1  8.0  ALBUMIN 4.2 4.3 4.4    TUMOR MARKERS: Recent Labs    05/03/19 1308  AFPTM 5.2    Assessment and Plan:  63 y/o F with recently noted elevated LFTs with positive ANA and elevated IgG concerning for possible autoimmune hepatitis. IR has been asked to perform an image guided random liver biopsy for further evaluation.  Patient has been NPO since 0650 this morning, she does not take blood thinning medications. Afebrile, WBC 6.6, hgb 13.3, plt 190, INR 1.0.  Risks and benefits of random liver biopsy was discussed with the patient and/or patient's family including, but not limited to bleeding, infection, damage to adjacent structures or low yield requiring additional tests.  All of the questions were answered and there is agreement to proceed.  Consent signed and in chart.  Thank you for this interesting consult.  I greatly enjoyed meeting ARLENE BRICKEL and look forward to participating in their care.  A copy of this report was sent to the requesting provider on this date.  Electronically Signed: Joaquim Nam, PA-C 05/24/2019, 11:23 AM   I spent a total of  30 Minutes   in face to face in clinical consultation, greater than 50% of which was counseling/coordinating care for random liver biopsy.

## 2019-05-24 NOTE — Procedures (Signed)
Interventional Radiology Procedure Note  Procedure: US Guided Biopsy of Liver  Complications: None  Estimated Blood Loss: < 10 mL  Findings: 18 G core biopsy of liver performed under US guidance.  Two core samples obtained and sent to Pathology.  Venetia Night. Kathlene Cote, M.D Pager:  [phone]

## 2019-05-24 NOTE — Discharge Instructions (Signed)
Liver Biopsy, Care After These instructions give you information on caring for yourself after your procedure. Your doctor may also give you more specific instructions. Call your doctor if you have any problems or questions after your procedure. What can I expect after the procedure? After the procedure, it is common to have:  Pain and soreness where the biopsy was done.  Bruising around the area where the biopsy was done.  Sleepiness and be tired for a few days. Follow these instructions at home: Medicines  Take over-the-counter and prescription medicines only as told by your doctor.  If you were prescribed an antibiotic medicine, take it as told by your doctor. Do not stop taking the antibiotic even if you start to feel better.  Do not take medicines such as aspirin and ibuprofen. These medicines can thin your blood. Do not take these medicines unless your doctor tells you to take them.  If you are taking prescription pain medicine, take actions to prevent or treat constipation. Your doctor may recommend that you: ? Drink enough fluid to keep your pee (urine) clear or pale yellow. ? Take over-the-counter or prescription medicines. ? Eat foods that are high in fiber, such as fresh fruits and vegetables, whole grains, and beans. ? Limit foods that are high in fat and processed sugars, such as fried and sweet foods. Caring for your cut  Follow instructions from your doctor about how to take care of your cuts from surgery (incisions). Make sure you: ? Wash your hands with soap and water before you change your bandage (dressing). If you cannot use soap and water, use hand sanitizer. ? Change your bandage as told by your doctor. ? Leave stitches (sutures), skin glue, or skin tape (adhesive) strips in place. They may need to stay in place for 2 weeks or longer. If tape strips get loose and curl up, you may trim the loose edges. Do not remove tape strips completely unless your doctor says it is  okay.  Check your cuts every day for signs of infection. Check for: ? Redness, swelling, or more pain. ? Fluid or blood. ? Pus or a bad smell. ? Warmth.  Do not take baths, swim, or use a hot tub until your doctor says it is okay to do so. Activity   Rest at home for 1-2 days or as told by your doctor. ? Avoid sitting for a long time without moving. Get up to take short walks every 1-2 hours.  Return to your normal activities as told by your doctor. Ask what activities are safe for you.  Do not do these things in the first 24 hours: ? Drive. ? Use machinery. ? Take a bath or shower.  Do not lift more than 10 pounds (4.5 kg) or play contact sports for the first 2 weeks. General instructions   Do not drink alcohol in the first week after the procedure.  Have someone stay with you for at least 24 hours after the procedure.  Get your test results. Ask your doctor or the department that is doing the test: ? When will my results be ready? ? How will I get my results? ? What are my treatment options? ? What other tests do I need? ? What are my next steps?  Keep all follow-up visits as told by your doctor. This is important. Contact a doctor if:  A cut bleeds and leaves more than just a small spot of blood.  A cut is red, puffs up (  swells), or hurts more than before.  Fluid or something else comes from a cut.  A cut smells bad.  You have a fever or chills. Get help right away if:  You have swelling, bloating, or pain in your belly (abdomen).  You get dizzy or faint.  You have a rash.  You feel sick to your stomach (nauseous) or throw up (vomit).  You have trouble breathing, feel short of breath, or feel faint.  Your chest hurts.  You have problems talking or seeing.  You have trouble with your balance or moving your arms or legs. Summary  After the procedure, it is common to have pain, soreness, bruising, and tiredness.  Your doctor will tell you how to  take care of yourself at home. Change your bandage, take your medicines, and limit your activities as told by your doctor.  Call your doctor if you have symptoms of infection. Get help right away if your belly swells, your cut bleeds a lot, or you have trouble talking or breathing. This information is not intended to replace advice given to you by your health care provider. Make sure you discuss any questions you have with your health care provider. Document Released: 03/04/2008 Document Revised: 06/05/2017 Document Reviewed: 06/05/2017 Elsevier Patient Education  [place]. Moderate Conscious Sedation, Adult, Care After These instructions provide you with information about caring for yourself after your procedure. Your health care provider may also give you more specific instructions. Your treatment has been planned according to current medical practices, but problems sometimes occur. Call your health care provider if you have any problems or questions after your procedure. What can I expect after the procedure? After your procedure, it is common:  To feel sleepy for several hours.  To feel clumsy and have poor balance for several hours.  To have poor judgment for several hours.  To vomit if you eat too soon. Follow these instructions at home: For at least 24 hours after the procedure:   Do not: ? Participate in activities where you could fall or become injured. ? Drive. ? Use heavy machinery. ? Drink alcohol. ? Take sleeping pills or medicines that cause drowsiness. ? Make important decisions or sign legal documents. ? Take care of children on your own.  Rest. Eating and drinking  Follow the diet recommended by your health care provider.  If you vomit: ? Drink water, juice, or soup when you can drink without vomiting. ? Make sure you have little or no nausea before eating solid foods. General instructions  Have a responsible adult stay with you until you are awake  and alert.  Take over-the-counter and prescription medicines only as told by your health care provider.  If you smoke, do not smoke without supervision.  Keep all follow-up visits as told by your health care provider. This is important. Contact a health care provider if:  You keep feeling nauseous or you keep vomiting.  You feel light-headed.  You develop a rash.  You have a fever. Get help right away if:  You have trouble breathing. This information is not intended to replace advice given to you by your health care provider. Make sure you discuss any questions you have with your health care provider. Document Released: 03/16/2013 Document Revised: 05/08/2017 Document Reviewed: 09/15/2015 Elsevier Patient Education  2020 [employer].

## 2019-05-25 ENCOUNTER — Other Ambulatory Visit: Payer: Self-pay

## 2019-06-06 ENCOUNTER — Telehealth: Payer: Self-pay | Admitting: Nurse Practitioner

## 2019-06-06 NOTE — Telephone Encounter (Signed)
Thomasena Edis is out of the office this week, can you check on the status of the patient's liver biopsy results from [id number]?  (The liver biopsy was done on 12/16 and the [facility] sent the liver biopsy slides to [facility] for their interpretation). I do not see the [facility]  results in Epic. If you locate these results please send to me and Dr. Hilarie Fredrickson. Thank you.

## 2019-06-08 NOTE — Telephone Encounter (Signed)
I spoke with radiology and was told that [facility] has reviewed the slides and a report was sent back to [facility] path. The report should be finalized and scanned into Epic tomorrow.

## 2019-06-09 NOTE — Telephone Encounter (Signed)
Noted, I will look for these results in epic

## 2019-06-13 ENCOUNTER — Encounter (HOSPITAL_COMMUNITY): Payer: Self-pay

## 2019-06-13 ENCOUNTER — Telehealth: Payer: Self-pay | Admitting: Nurse Practitioner

## 2019-06-13 LAB — SURGICAL PATHOLOGY

## 2019-06-13 NOTE — Telephone Encounter (Signed)
I spoke with radiology again and was told that the radiologist will be scanned into Epic as soon as completed.

## 2019-06-13 NOTE — Telephone Encounter (Signed)
[facility] Pathology department called and advises the report from [facility] Pathology has been scanned into the chart under MEDIA-with the date of today-finalized patho report is also under the date 05/24/2019;

## 2019-06-13 NOTE — Telephone Encounter (Signed)
Patty, refer to last phone note. Can you check again on the status of pt's liver bx results from [place] as I do not see their report yet in Epic. thx

## 2019-06-17 ENCOUNTER — Other Ambulatory Visit: Payer: Self-pay

## 2019-06-17 DIAGNOSIS — K754 Autoimmune hepatitis: Secondary | ICD-10-CM

## 2019-06-17 MED ORDER — PREDNISONE 10 MG PO TABS: 20.0000 mg | ORAL_TABLET | Freq: Every day | ORAL | 0 refills | Status: DC

## 2019-06-17 MED FILL — predniSONE 10 MG TABS: 10 | 50 days supply | Qty: 100 | Fill #0

## 2019-06-17 NOTE — Telephone Encounter (Signed)
Liver bx report from [facility] received and reviewed, forwarded result to Dr. Hilarie Fredrickson.

## 2019-06-21 ENCOUNTER — Other Ambulatory Visit: Payer: 59

## 2019-06-21 DIAGNOSIS — K754 Autoimmune hepatitis: Secondary | ICD-10-CM | POA: Diagnosis not present

## 2019-06-23 LAB — QUANTIFERON-TB GOLD PLUS
Mitogen-NIL: >10 IU/mL
NIL: 0.04 IU/mL
QuantiFERON-TB Gold Plus: NEGATIVE
TB1-NIL: <0 IU/mL
TB2-NIL: 0 IU/mL

## 2019-06-28 LAB — TPMT GENETIC TEST

## 2019-07-01 ENCOUNTER — Other Ambulatory Visit: Payer: Self-pay

## 2019-07-01 ENCOUNTER — Telehealth: Payer: Self-pay

## 2019-07-01 DIAGNOSIS — K754 Autoimmune hepatitis: Secondary | ICD-10-CM

## 2019-07-01 NOTE — Telephone Encounter (Signed)
Spoke with pt and she is aware, order in epic. 

## 2019-07-01 NOTE — Telephone Encounter (Signed)
-----   Message from Algernon Huxley, RN sent at 06/17/2019  4:16 PM EST ----- Regarding: Labs Pt needs LFT's drawn

## 2019-07-04 ENCOUNTER — Other Ambulatory Visit: Payer: Self-pay | Admitting: Family Medicine

## 2019-07-04 DIAGNOSIS — Z1231 Encounter for screening mammogram for malignant neoplasm of breast: Secondary | ICD-10-CM

## 2019-07-05 ENCOUNTER — Other Ambulatory Visit (INDEPENDENT_AMBULATORY_CARE_PROVIDER_SITE_OTHER): Payer: 59

## 2019-07-05 DIAGNOSIS — K754 Autoimmune hepatitis: Secondary | ICD-10-CM

## 2019-07-05 LAB — HEPATIC FUNCTION PANEL
ALT: 78 U/L — ABNORMAL HIGH (ref 0–35)
AST: 52 U/L — ABNORMAL HIGH (ref 0–37)
Albumin: 4.5 g/dL (ref 3.5–5.2)
Alkaline Phosphatase: 104 U/L (ref 39–117)
Bilirubin, Direct: 0.2 mg/dL (ref 0.0–0.3)
Total Bilirubin: 0.7 mg/dL (ref 0.2–1.2)
Total Protein: 8.2 g/dL (ref 6.0–8.3)

## 2019-07-06 ENCOUNTER — Other Ambulatory Visit: Payer: Self-pay

## 2019-07-06 DIAGNOSIS — K754 Autoimmune hepatitis: Secondary | ICD-10-CM

## 2019-07-11 ENCOUNTER — Ambulatory Visit: Payer: 59 | Admitting: Internal Medicine

## 2019-07-19 ENCOUNTER — Emergency Department (HOSPITAL_COMMUNITY)
Admission: EM | Admit: 2019-07-19 | Discharge: 2019-07-19 | Disposition: A | Discharge status: Home / Self Care | Payer: 59 | Attending: Emergency Medicine | Admitting: Emergency Medicine

## 2019-07-19 ENCOUNTER — Other Ambulatory Visit: Payer: Self-pay

## 2019-07-19 ENCOUNTER — Encounter (HOSPITAL_COMMUNITY): Payer: Self-pay | Admitting: Emergency Medicine

## 2019-07-19 DIAGNOSIS — Z7982 Long term (current) use of aspirin: Secondary | ICD-10-CM | POA: Diagnosis not present

## 2019-07-19 DIAGNOSIS — Z79899 Other long term (current) drug therapy: Secondary | ICD-10-CM | POA: Diagnosis not present

## 2019-07-19 DIAGNOSIS — M5442 Lumbago with sciatica, left side: Secondary | ICD-10-CM | POA: Insufficient documentation

## 2019-07-19 DIAGNOSIS — F17210 Nicotine dependence, cigarettes, uncomplicated: Secondary | ICD-10-CM | POA: Diagnosis not present

## 2019-07-19 DIAGNOSIS — I10 Essential (primary) hypertension: Secondary | ICD-10-CM | POA: Diagnosis not present

## 2019-07-19 DIAGNOSIS — M545 Low back pain: Secondary | ICD-10-CM | POA: Diagnosis present

## 2019-07-19 MED ORDER — PREDNISONE 10 MG (21) PO TBPK: ORAL_TABLET | ORAL | 0 refills | Status: DC

## 2019-07-19 MED ORDER — METHOCARBAMOL 500 MG PO TABS: 500.0000 mg | ORAL_TABLET | Freq: Two times a day (BID) | ORAL | 0 refills | Status: DC

## 2019-07-19 MED ORDER — LIDOCAINE 5 % EX PTCH
1.0000 | MEDICATED_PATCH | Freq: Once | CUTANEOUS | Status: DC
  Administered 2019-07-19: 1 via TRANSDERMAL
  Filled 2019-07-19: qty 1

## 2019-07-19 MED ORDER — LIDOCAINE 5 % EX PTCH: 1.0000 | MEDICATED_PATCH | CUTANEOUS | 0 refills | Status: DC

## 2019-07-19 MED FILL — LIDOCAINE PATCH 5%: 5 | 30 days supply | Qty: 30 | Fill #0

## 2019-07-19 MED FILL — predniSONE 10 MG (21) TBPK: 10 | 6 days supply | Qty: 21 | Fill #0

## 2019-07-19 MED FILL — METHOCARBAMOL 500 MG TABS: 500 | 10 days supply | Qty: 20 | Fill #0

## 2019-07-19 NOTE — ED Notes (Signed)
Provider aware of blood pressure. Pt states she has not taken her blood pressure today. Pt also states her current medication regimen was not working and pt was encouraged to schedule an appointment with PCP as soon as possible. Pt verbalized understanding.

## 2019-07-19 NOTE — Discharge Instructions (Addendum)
  Expect your soreness to increase over the next 2-3 days. Take it easy, but do not lay around too much as this may make any stiffness worse.  Antiinflammatory medications: Take 600 mg of ibuprofen every 6 hours or 440 mg (over the counter dose) to 500 mg (prescription dose) of naproxen every 12 hours for the next 3 days. After this time, these medications may be used as needed for pain. Take these medications with food to avoid upset stomach. Choose only one of these medications, do not take them together. Acetaminophen (generic for Tylenol): Should you continue to have additional pain while taking the ibuprofen or naproxen, you may add in acetaminophen as needed. Your daily total maximum amount of acetaminophen from all sources should be limited to 4060m/day for persons without liver problems, or 2009mday for those with liver problems. Methocarbamol: Methocarbamol (generic for Robaxin) is a muscle relaxer and can help relieve stiff muscles or muscle spasms.  Do not drive or perform other dangerous activities while taking this medication as it can cause drowsiness as well as changes in reaction time and judgement. Lidocaine patches: These are available via either prescription or over-the-counter. The over-the-counter option may be more economical one and are likely just as effective. There are multiple over-the-counter brands, such as Salonpas. Ice: May apply ice to the area over the next 24 hours for 15 minutes at a time to reduce pain, inflammation, and swelling, if present. Exercises: Be sure to perform the attached exercises starting with three times a week and working up to performing them daily. This is an essential part of preventing long term problems.  Follow up: Follow up with a primary care provider for any future management of these complaints. Be sure to follow up within 7-10 days. Return: Return to the ED should symptoms worsen.  For prescription assistance, may try using prescription  discount sites or apps, such as goodrx.com

## 2019-07-19 NOTE — ED Provider Notes (Signed)
[facility] DEPT Provider Note   CSN: [id number] Arrival date & time: 07/19/19  9326     History Chief Complaint  Patient presents with  . Back Pain  . Leg Pain    Kimberly Pierce is a 64 y.o. female.  HPI     Kimberly Pierce is a 64 y.o. female, with a history of DVT and HTN, presenting to the ED with lower back pain for the last 3 weeks.  Patient works in [employer], picked up some heavy bags of laundry, and felt a twinge in her back.  She has been experiencing left lower back pain, described as a soreness with shooting pains into her buttocks and back of the left leg, sometimes having spasms, moderate to severe. She has been taking ibuprofen and Tylenol intermittently.  Denies fever/chills, abdominal pain, chest pain, shortness of breath, changes in bowel or bladder function, saddle anesthesias, numbness, weakness, falls/trauma, urinary symptoms, calf swelling/pain, or any other complaints.  Past Medical History:  Diagnosis Date  . DVT (deep venous thrombosis) ([place])   . Hypertension     Patient Active Problem List   Diagnosis Date Noted  . Liver cirrhosis secondary to NASH ([place]) 04/06/2019  . Cervical cancer screening 05/04/2017  . Elevated ferritin 05/08/2016  . Routine general medical examination at a health care facility 05/07/2016  . Cervical radiculitis 10/04/2014  . Degeneration of meniscus of right knee 09/20/2014  . Systolic murmur 71/24/5809  . Primary osteoarthritis of both knees 09/11/2014  . Vitamin D deficiency 03/24/2013  . Hyperlipidemia with target LDL less than 130 03/23/2013  . Hyperglycemia 03/23/2013  . Fracture of lumbar vertebra, compression ([place]) 05/05/2011  . Osteoporosis 05/05/2011  . TOBACCO USE 07/31/2010  . Essential hypertension 07/31/2010    Past Surgical History:  Procedure Laterality Date  . ABDOMINAL HYSTERECTOMY       OB History   No obstetric history on file.      Family History  Problem Relation Age of Onset  . Heart attack Father   . Heart attack Mother   . Alcohol abuse Other   . Drug abuse Other   . Hypertension Other   . Thyroid disease Other   . Colon cancer Neg Hx     Social History   Tobacco Use  . Smoking status: Current Some Day Smoker  . Smokeless tobacco: Never Used  Substance Use Topics  . Alcohol use: No    Comment: occaisional  . Drug use: No    Home Medications Prior to Admission medications   Medication Sig Start Date End Date Taking? Authorizing Provider  aspirin EC 81 MG tablet Take 81 mg by mouth daily.   Yes [provider]  chlorthalidone (HYGROTON) 25 MG tablet Take 1 tablet (25 mg total) by mouth daily. 02/16/19  Yes Janith Lima, MD  Cholecalciferol 25 MCG (1000 UT) tablet Take 1,000 Units by mouth daily.   Yes [provider]  ibuprofen (ADVIL) 200 MG tablet Take 800 mg by mouth every 6 (six) hours as needed (For back pain.).   Yes [provider]  loratadine (CLARITIN) 10 MG tablet Take 20 mg by mouth daily as needed for allergies.   Yes [provider]  nebivolol (BYSTOLIC) 5 MG tablet Take 1 tablet (5 mg total) by mouth daily. 02/16/19  Yes Janith Lima, MD  rosuvastatin (CRESTOR) 5 MG tablet Take 1 tablet (5 mg total) by mouth daily. 02/16/19  Yes [name]  L, MD  tetrahydrozoline 0.05 % ophthalmic solution Place 1 drop into both eyes 4 (four) times daily as needed (For dry eyes.).    Yes [provider]  TURMERIC PO Take 1 capsule by mouth daily.   Yes [provider]  lidocaine (LIDODERM) 5 % Place 1 patch onto the skin daily. Remove & Discard patch within 12 hours or as directed by MD 07/19/19   Capri Raben C, PA-C  methocarbamol (ROBAXIN) 500 MG tablet Take 1 tablet (500 mg total) by mouth 2 (two) times daily. 07/19/19   Jahn Franchini C, PA-C  potassium chloride (K-DUR) 10 MEQ tablet Take 1 tablet (10 mEq total) by mouth daily. 02/16/19   Janith Lima, MD  predniSONE (STERAPRED UNI-PAK 21 TAB) 10 MG (21) TBPK tablet Take 6 tabs (82m) day 1, 5 tabs (553m day 2, 4 tabs (4074mday 3, 3 tabs (59m68may 4, 2 tabs (20mg72my 5, and 1 tab (10mg)75m 6. 07/19/19   Rylon Poitra Helane Gunther    Allergies    Percocet [oxycodone-acetaminophen]  Review of Systems   Review of Systems  Constitutional: Negative for chills and fever.  Respiratory: Negative for cough and shortness of breath.   Cardiovascular: Negative for chest pain.  Gastrointestinal: Negative for abdominal pain, nausea and vomiting.  Genitourinary: Negative for difficulty urinating and dysuria.  Musculoskeletal: Positive for back pain.  Neurological: Negative for dizziness, syncope, weakness and numbness.  All other systems reviewed and are negative.   Physical Exam Updated Vital Signs BP (!) 198/106 (BP Location: Right Arm) Comment: Patient states " did not take BP meds this am." RN made aware.  Pulse 80   Temp 98.3 F (36.8 C) (Oral)   Resp 18   SpO2 100%   Physical Exam Vitals and nursing note reviewed.  Constitutional:      General: She is not in acute distress.    Appearance: She is well-developed. She is not diaphoretic.  HENT:     Head: Normocephalic and atraumatic.     Mouth/Throat:     Mouth: Mucous membranes are moist.     Pharynx: Oropharynx is clear.  Eyes:     Conjunctiva/sclera: Conjunctivae normal.  Cardiovascular:     Rate and Rhythm: Normal rate and regular rhythm.     Pulses: Normal pulses.          Radial pulses are 2+ on the right side and 2+ on the left side.       Posterior tibial pulses are 2+ on the right side and 2+ on the left side.     Comments: Tactile temperature in the extremities appropriate and equal bilaterally. Pulmonary:     Effort: Pulmonary effort is normal. No respiratory distress.  Abdominal:     Palpations: Abdomen is soft.     Tenderness: There is no abdominal tenderness. There is no guarding.  Musculoskeletal:     Cervical  back: Neck supple.     Lumbar back: Tenderness present. No bony tenderness.       Back:     Right lower leg: No edema.     Left lower leg: No edema.     Comments: Normal motor function intact in all extremities. No midline spinal tenderness.   No swelling, tenderness, increased warmth, erythema in the bilateral calves.  Lymphadenopathy:     Cervical: No cervical adenopathy.  Skin:    General: Skin is warm and dry.  Neurological:     Mental Status: She is  alert.     Comments: Sensation grossly intact to light touch in the lower extremities bilaterally. No saddle anesthesias. Strength 5/5 in the bilateral lower extremities. No noted gait deficit. Coordination intact.  Psychiatric:        Mood and Affect: Mood and affect normal.        Speech: Speech normal.        Behavior: Behavior normal.     ED Results / Procedures / Treatments   Labs (all labs ordered are listed, but only abnormal results are displayed) Labs Reviewed - No data to display  EKG None  Radiology No results found.  Procedures Procedures (including critical care time)  Medications Ordered in ED Medications  lidocaine (LIDODERM) 5 % 1 patch (1 patch Transdermal Patch Applied 07/19/19 5361)    ED Course  I have reviewed the triage vital signs and the nursing notes.  Pertinent labs & imaging results that were available during my care of the patient were reviewed by me and considered in my medical decision making (see chart for details).  Clinical Course as of Jul 18 941  Tue Jul 19, 2019  4431 Has not taken her BP meds this morning.  She states she has been having difficulty controlling her blood pressure for several months.  She is actively working with her PCP on this matter.  BP(!): 198/106 [SJ]    Clinical Course User Index [SJ] Aviela Blundell, Helane Gunther, PA-C   MDM Rules/Calculators/A&P                      Patient presents with 3 weeks of atraumatic back pain.  Features suggestive of sciatica.  No  symptoms or findings suggestive emergent condition, such as cauda equina. The patient was given instructions for home care as well as return precautions. Patient voices understanding of these instructions, accepts the plan, and is comfortable with discharge.   Final Clinical Impression(s) / ED Diagnoses Final diagnoses:  Acute left-sided low back pain with left-sided sciatica    Rx / DC Orders ED Discharge Orders         Ordered    lidocaine (LIDODERM) 5 %  Every 24 hours     07/19/19 0939    predniSONE (STERAPRED UNI-PAK 21 TAB) 10 MG (21) TBPK tablet     07/19/19 0939    methocarbamol (ROBAXIN) 500 MG tablet  2 times daily     07/19/19 Duncan, Chryl Holten C, PA-C 07/19/19 0944    Blanchie Dessert, MD 07/19/19 1511

## 2019-07-19 NOTE — ED Triage Notes (Addendum)
Patient states she has had lower back pain that radiates down to her L leg x3 weeks. She has tried Tylenol and heating pads w/o relief. States it hurts to get up in the mornings and to sit down. Ambulatory in triage with a slight limp of her L leg. Denies any recent falls or trauma.

## 2019-07-20 ENCOUNTER — Telehealth: Payer: Self-pay

## 2019-07-20 NOTE — Telephone Encounter (Signed)
Pt aware, order in epic.

## 2019-07-20 NOTE — Telephone Encounter (Signed)
-----   Message from Algernon Huxley, RN sent at 07/06/2019 11:47 AM EST ----- Regarding: LFT's Pt needs labs, orders in epic.

## 2019-07-21 ENCOUNTER — Other Ambulatory Visit (INDEPENDENT_AMBULATORY_CARE_PROVIDER_SITE_OTHER): Payer: 59

## 2019-07-21 DIAGNOSIS — K754 Autoimmune hepatitis: Secondary | ICD-10-CM

## 2019-07-21 LAB — HEPATIC FUNCTION PANEL
ALT: 95 U/L — ABNORMAL HIGH (ref 0–35)
AST: 83 U/L — ABNORMAL HIGH (ref 0–37)
Albumin: 4.7 g/dL (ref 3.5–5.2)
Alkaline Phosphatase: 98 U/L (ref 39–117)
Bilirubin, Direct: 0.2 mg/dL (ref 0.0–0.3)
Total Bilirubin: 0.7 mg/dL (ref 0.2–1.2)
Total Protein: 8.6 g/dL — ABNORMAL HIGH (ref 6.0–8.3)

## 2019-08-04 MED FILL — VIT D2 1.25 MG (50,000 UNIT: 1.25 MG | 84 days supply | Qty: 12 | Fill #0

## 2019-08-06 ENCOUNTER — Encounter (HOSPITAL_COMMUNITY): Payer: Self-pay | Admitting: Emergency Medicine

## 2019-08-06 ENCOUNTER — Emergency Department (HOSPITAL_COMMUNITY)
Admission: EM | Admit: 2019-08-06 | Discharge: 2019-08-06 | Disposition: A | Discharge status: Home / Self Care | Payer: 59 | Attending: Emergency Medicine | Admitting: Emergency Medicine

## 2019-08-06 ENCOUNTER — Emergency Department (HOSPITAL_COMMUNITY): Payer: 59

## 2019-08-06 ENCOUNTER — Emergency Department (HOSPITAL_BASED_OUTPATIENT_CLINIC_OR_DEPARTMENT_OTHER): Payer: 59

## 2019-08-06 ENCOUNTER — Other Ambulatory Visit: Payer: Self-pay

## 2019-08-06 DIAGNOSIS — F17200 Nicotine dependence, unspecified, uncomplicated: Secondary | ICD-10-CM | POA: Diagnosis not present

## 2019-08-06 DIAGNOSIS — M79609 Pain in unspecified limb: Secondary | ICD-10-CM

## 2019-08-06 DIAGNOSIS — X58XXXA Exposure to other specified factors, initial encounter: Secondary | ICD-10-CM | POA: Insufficient documentation

## 2019-08-06 DIAGNOSIS — Z7982 Long term (current) use of aspirin: Secondary | ICD-10-CM | POA: Diagnosis not present

## 2019-08-06 DIAGNOSIS — Z86718 Personal history of other venous thrombosis and embolism: Secondary | ICD-10-CM | POA: Insufficient documentation

## 2019-08-06 DIAGNOSIS — I10 Essential (primary) hypertension: Secondary | ICD-10-CM | POA: Diagnosis not present

## 2019-08-06 DIAGNOSIS — M79605 Pain in left leg: Secondary | ICD-10-CM | POA: Diagnosis not present

## 2019-08-06 DIAGNOSIS — M4854XA Collapsed vertebra, not elsewhere classified, thoracic region, initial encounter for fracture: Secondary | ICD-10-CM | POA: Diagnosis not present

## 2019-08-06 DIAGNOSIS — Y9389 Activity, other specified: Secondary | ICD-10-CM | POA: Insufficient documentation

## 2019-08-06 DIAGNOSIS — M545 Low back pain: Secondary | ICD-10-CM | POA: Diagnosis not present

## 2019-08-06 DIAGNOSIS — Y999 Unspecified external cause status: Secondary | ICD-10-CM | POA: Diagnosis not present

## 2019-08-06 DIAGNOSIS — S32000A Wedge compression fracture of unspecified lumbar vertebra, initial encounter for closed fracture: Secondary | ICD-10-CM | POA: Diagnosis not present

## 2019-08-06 DIAGNOSIS — Y9289 Other specified places as the place of occurrence of the external cause: Secondary | ICD-10-CM | POA: Diagnosis not present

## 2019-08-06 DIAGNOSIS — Z79899 Other long term (current) drug therapy: Secondary | ICD-10-CM | POA: Insufficient documentation

## 2019-08-06 DIAGNOSIS — M1612 Unilateral primary osteoarthritis, left hip: Secondary | ICD-10-CM | POA: Diagnosis not present

## 2019-08-06 MED ORDER — DIAZEPAM 5 MG PO TABS: 5.0000 mg | ORAL_TABLET | Freq: Two times a day (BID) | ORAL | 0 refills | Status: DC | PRN

## 2019-08-06 MED ORDER — LIDOCAINE 5 % EX PTCH
1.0000 | MEDICATED_PATCH | Freq: Once | CUTANEOUS | Status: DC
  Administered 2019-08-06: 1 via TRANSDERMAL
  Filled 2019-08-06: qty 1

## 2019-08-06 MED ORDER — DIAZEPAM 5 MG PO TABS
5.0000 mg | ORAL_TABLET | Freq: Once | ORAL | Status: AC
  Administered 2019-08-06: 5 mg via ORAL
  Filled 2019-08-06: qty 1

## 2019-08-06 MED ORDER — NAPROXEN 500 MG PO TABS: 500.0000 mg | ORAL_TABLET | Freq: Two times a day (BID) | ORAL | 0 refills | Status: DC

## 2019-08-06 NOTE — ED Triage Notes (Signed)
Pt states she has pain down her Left leg, radiating from her back for the past month and a half. She was seen at [facility] for the same on 2/9. She denies any falls or trauma. She states she has taken tylenol, ibuprofen and the meds she was discharged with without relief. Denies loss of bowel or bladder. No numbness or tingling.

## 2019-08-06 NOTE — ED Notes (Signed)
Patient verbalizes understanding of discharge instructions. Opportunity for questioning and answers were provided. Pt discharged from ED. 

## 2019-08-06 NOTE — Progress Notes (Signed)
LEV has been completed.   Preliminary results in CV Proc.   Abram Sander 08/06/2019 10:55 AM

## 2019-08-06 NOTE — Discharge Instructions (Addendum)
You were seen in the emergency department today for left leg pain.  Your ultrasound was negative for a blood clot.  Your x-ray showed multiple compression fractures in your lumbar spine (lower back) as well as some arthritis in your right hip.  We are sending you home with the following medicines:  - Naproxen is a nonsteroidal anti-inflammatory medication that will help with pain and swelling. Be sure to take this medication as prescribed with food, 1 pill every 12 hours,  It should be taken with food, as it can cause stomach upset, and more seriously, stomach bleeding. Do not take other nonsteroidal anti-inflammatory medications with this such as Advil, Motrin, Aleve, Mobic, Goodie Powder, or Motrin.    - Valium is a benzodiazepine which is a controlled substance, this is the muscle relaxer we have prescribed, this is meant to help with muscle tightness. Be aware that this medication may make you drowsy therefore the first time you take this it should be at a time you are in an environment where you can rest. Do not drive or operate heavy machinery when taking this medication. Do not drink alcohol or take other sedating medications with this medicine such as narcotics or other muscle relaxants.   We have prescribed you new medication(s) today. Discuss the medications prescribed today with your pharmacist as they can have adverse effects and interactions with your other medicines including over the counter and prescribed medications. Seek medical evaluation if you start to experience new or abnormal symptoms after taking one of these medicines, seek care immediately if you start to experience difficulty breathing, feeling of your throat closing, facial swelling, or rash as these could be indications of a more serious allergic reaction  Do not take Tylenol due to your liver disease.  Apply heat to the lower back as needed to help with discomfort.  We would like you to follow-up with your primary care  provider and/or with orthopedics within 3 days.  Is important that you discuss these fractures with your primary care provider to ensure that these are not being caused by a different disease and are not what are known as a pathological fractures.  Return to the emergency department for new or worsening symptoms including but not limited to worsening pain, numbness, weakness, loss of control bowel or bladder function, inability to walk, fever, or any other concerns.  Please be sure to take your blood pressure medication as prescribed and follow with your primary care provider for a recheck of your blood pressure as it was elevated in the ER today.

## 2019-08-06 NOTE — ED Provider Notes (Signed)
[facility] EMERGENCY DEPARTMENT Provider Note   CSN: [id number] Arrival date & time: 08/06/19  0847     History Chief Complaint  Patient presents with  . Sciatica    Kimberly Pierce is a 64 y.o. female with a history of tobacco abuse, prior DVT not on anticoagulation, prior lumbar compression fx, liver cirrhosis secondary to NASH, osteoarthritis, & hyperlipidemia who presents to the ED with complaints of left lower extremity pain x 1.5 months. Patient states she is having pain in the L gluteal region down into the lower leg, it is a constant aching pain, worse with certain movements/positions, no alleviating factors. Does some heavy lifting at work which may have triggered pain @ onset and continues to aggravate it. Seen in the ED 07/19/19 for similar, was prescribed robaxin, steroid taper, & lidoderm patches which she states did not seem to help. She is also taking ibuprofen/tylenol without much relief either. Denies numbness, tingling, weakness, saddle anesthesia, incontinence to bowel/bladder, fever, chills, IV drug use, dysuria, or hx of cancer. She states some qualities of this remind her of her prior DVT which was in the [facility] several years ago. Denies leg swelling, hemoptysis, recent surgery/trauma, recent long travel, hormone use, personal hx of cancer, chest pain, or dyspnea.       HPI     Past Medical History:  Diagnosis Date  . DVT (deep venous thrombosis) ([place])   . Hypertension     Patient Active Problem List   Diagnosis Date Noted  . Liver cirrhosis secondary to NASH ([place]) 04/06/2019  . Cervical cancer screening 05/04/2017  . Elevated ferritin 05/08/2016  . Routine general medical examination at a health care facility 05/07/2016  . Cervical radiculitis 10/04/2014  . Degeneration of meniscus of right knee 09/20/2014  . Systolic murmur 82/50/5397  . Primary osteoarthritis of both knees 09/11/2014  . Vitamin D deficiency 03/24/2013  . Hyperlipidemia  with target LDL less than 130 03/23/2013  . Hyperglycemia 03/23/2013  . Fracture of lumbar vertebra, compression ([place]) 05/05/2011  . Osteoporosis 05/05/2011  . TOBACCO USE 07/31/2010  . Essential hypertension 07/31/2010    Past Surgical History:  Procedure Laterality Date  . ABDOMINAL HYSTERECTOMY       OB History   No obstetric history on file.     Family History  Problem Relation Age of Onset  . Heart attack Father   . Heart attack Mother   . Alcohol abuse Other   . Drug abuse Other   . Hypertension Other   . Thyroid disease Other   . Colon cancer Neg Hx     Social History   Tobacco Use  . Smoking status: Current Some Day Smoker  . Smokeless tobacco: Never Used  Substance Use Topics  . Alcohol use: No    Comment: occaisional  . Drug use: No    Home Medications Prior to Admission medications   Medication Sig Start Date End Date Taking? Authorizing Provider  aspirin EC 81 MG tablet Take 81 mg by mouth daily.    [provider]  chlorthalidone (HYGROTON) 25 MG tablet Take 1 tablet (25 mg total) by mouth daily. 02/16/19   Janith Lima, MD  Cholecalciferol 25 MCG (1000 UT) tablet Take 1,000 Units by mouth daily.    [provider]  ibuprofen (ADVIL) 200 MG tablet Take 800 mg by mouth every 6 (six) hours as needed (For back pain.).    [provider]  lidocaine (LIDODERM) 5 % Place 1  patch onto the skin daily. Remove & Discard patch within 12 hours or as directed by MD 07/19/19   Joy, Shawn C, PA-C  loratadine (CLARITIN) 10 MG tablet Take 20 mg by mouth daily as needed for allergies.    [provider]  methocarbamol (ROBAXIN) 500 MG tablet Take 1 tablet (500 mg total) by mouth 2 (two) times daily. 07/19/19   Joy, Shawn C, PA-C  nebivolol (BYSTOLIC) 5 MG tablet Take 1 tablet (5 mg total) by mouth daily. 02/16/19   Janith Lima, MD  potassium chloride (K-DUR) 10 MEQ tablet Take 1 tablet (10 mEq total) by mouth daily. 02/16/19   Janith Lima, MD  predniSONE (STERAPRED UNI-PAK 21 TAB) 10 MG (21) TBPK tablet Take 6 tabs (38m) day 1, 5 tabs (547m day 2, 4 tabs (4020mday 3, 3 tabs (38m22may 4, 2 tabs (20mg51my 5, and 1 tab (10mg)36m 6. 07/19/19   Joy, Shawn C, PA-C  rosuvastatin (CRESTOR) 5 MG tablet Take 1 tablet (5 mg total) by mouth daily. 02/16/19   Jones,Janith Limatetrahydrozoline 0.05 % ophthalmic solution Place 1 drop into both eyes 4 (four) times daily as needed (For dry eyes.).     [provider]  TURMERIC PO Take 1 capsule by mouth daily.    [provider]    Allergies    Percocet [oxycodone-acetaminophen]  Review of Systems   Review of Systems  Constitutional: Negative for chills and fever.  Respiratory: Negative for shortness of breath.   Cardiovascular: Negative for chest pain and leg swelling.  Gastrointestinal: Negative for abdominal pain, nausea and vomiting.  Genitourinary: Negative for dysuria and hematuria.  Musculoskeletal: Positive for arthralgias, back pain and myalgias.  Skin: Negative for color change and wound.  Neurological: Negative for weakness and numbness.       Negative for incontinence or saddle anesthesia.    Physical Exam Updated Vital Signs BP (!) 175/102   Pulse 84   Temp 97.9 F (36.6 C) (Oral)   Resp 16   SpO2 94%   Physical Exam Vitals and nursing note reviewed.  Constitutional:      General: She is not in acute distress.    Appearance: She is well-developed. She is not ill-appearing or toxic-appearing.  HENT:     Head: Normocephalic and atraumatic.  Eyes:     General:        Right eye: No discharge.        Left eye: No discharge.     Conjunctiva/sclera: Conjunctivae normal.  Cardiovascular:     Rate and Rhythm: Normal rate and regular rhythm.     Pulses:          Dorsalis pedis pulses are 2+ on the right side and 2+ on the left side.       Posterior tibial pulses are 2+ on the right side and 2+ on the left side.  Pulmonary:      Effort: Pulmonary effort is normal. No respiratory distress.     Breath sounds: Normal breath sounds. No wheezing, rhonchi or rales.  Abdominal:     General: There is no distension.     Palpations: Abdomen is soft.     Tenderness: There is no abdominal tenderness.  Musculoskeletal:     Cervical back: Normal range of motion and neck supple. No spinous process tenderness or muscular tenderness.     Comments: Back: Diffuse midline lumbar tenderness to palpation.  No point/focal vertebral tenderness  or palpable step-off.  No overlying rashes. Lower extremities: No obvious deformity, appreciable swelling, edema, erythema, ecchymosis, warmth, or open wounds. Patient has intact AROM to bilateral hips, knees, ankles, and all digits. LLE: tender to palpation in the L gluteal area, L hip more so laterally over the greater trochanter, and to the L proximal lateral portion of the femur. Otherwise nontender. No calf tenderness. Compartments are soft.   Skin:    General: Skin is warm and dry.     Capillary Refill: Capillary refill takes less than 2 seconds.     Findings: No rash.  Neurological:     Mental Status: She is alert.     Deep Tendon Reflexes:     Reflex Scores:      Patellar reflexes are 2+ on the right side and 2+ on the left side.    Comments: Alert. Clear speech. Sensation grossly intact to bilateral lower extremities. 5/5 strength with plantar/dorsiflexion bilaterally. Patient ambulatory with antalgic gait.  Psychiatric:        Mood and Affect: Mood normal.        Behavior: Behavior normal.     ED Results / Procedures / Treatments   Labs (all labs ordered are listed, but only abnormal results are displayed) Labs Reviewed - No data to display  EKG None  Radiology No results found.  Procedures Procedures (including critical care time)  Medications Ordered in ED Medications  lidocaine (LIDODERM) 5 % 1 patch (1 patch Transdermal Patch Applied 08/06/19 0927)  diazepam (VALIUM)  tablet 5 mg (5 mg Oral Given 08/06/19 6503)    ED Course  I have reviewed the triage vital signs and the nursing notes.  Pertinent labs & imaging results that were available during my care of the patient were reviewed by me and considered in my medical decision making (see chart for details).    MDM Rules/Calculators/A&P                     Patient presents to the ED with complaints of LLE pain x 1.5 months.  Nontoxic, vitals WNL with the exception of elevated BP- similar readings on additional history collected on chart review, - doubt HTN emergency. Patient has midline lumbar tenderness as well as L gluteal/lateral hip tenderness. ROM intact. NVI distally. No erythema/warmth/fever/rashes- does not appear consistent w/ septic joint, cellulitis, or shingles. Venous duplex negative for DVT. Hip xray without acute process. L spine xray with multiple compression fractures, new since 2012. Patient denies recent trauma/falls. Unclear acuity. She has no focal neuro deficits. Do not suspect cord compression or cauda equina. Afebrile no history of IVDU do not suspect epidural abscess. She is feeling better s/p valium & is ambulatory in the ED. Will discharge home with a few tablets of valium with naproxen- last renal function WNL on chart review, we discussed avoiding further tylenol given her NASH with elevated LFTs when most recently checked. We discussed importance of PCP follow up of these fractures, possibly from osteoporosis but would also need evaluation for possible pathologic fractures. Will give orthopedics follow up as well. I discussed results, treatment plan, need for follow-up, and return precautions with the patient. Provided opportunity for questions, patient confirmed understanding and is in agreement with plan.   Findings and plan of care discussed with supervising physician Dr. Johnney Killian who is in agreement.   Final Clinical Impression(s) / ED Diagnoses Final diagnoses:  Left leg pain    Compression fracture of lumbar vertebra, initial encounter, unspecified  lumbar vertebral level ([place])  Hypertension, unspecified type    Rx / DC Orders ED Discharge Orders         Ordered    naproxen (NAPROSYN) 500 MG tablet  2 times daily     08/06/19 1128    diazepam (VALIUM) 5 MG tablet  2 times daily PRN     08/06/19 [address], [place] R, PA-C 08/06/19 1128    Charlesetta Shanks, MD 08/07/19 [phone]

## 2019-08-08 ENCOUNTER — Ambulatory Visit: Payer: 59 | Admitting: Internal Medicine

## 2019-08-08 ENCOUNTER — Other Ambulatory Visit: Payer: Self-pay

## 2019-08-08 ENCOUNTER — Encounter: Payer: Self-pay | Admitting: Internal Medicine

## 2019-08-08 VITALS — BP 128/84 | HR 74 | Temp 98.2°F | Ht 66.0 in | Wt 199.0 lb

## 2019-08-08 DIAGNOSIS — S32000A Wedge compression fracture of unspecified lumbar vertebra, initial encounter for closed fracture: Secondary | ICD-10-CM

## 2019-08-08 DIAGNOSIS — I10 Essential (primary) hypertension: Secondary | ICD-10-CM | POA: Diagnosis not present

## 2019-08-08 DIAGNOSIS — M8080XA Other osteoporosis with current pathological fracture, unspecified site, initial encounter for fracture: Secondary | ICD-10-CM | POA: Diagnosis not present

## 2019-08-08 MED ORDER — KETOROLAC TROMETHAMINE 30 MG/ML IJ SOLN
30.0000 mg | Freq: Once | INTRAMUSCULAR | Status: AC
  Administered 2019-08-08: 30 mg via INTRAMUSCULAR

## 2019-08-08 MED ORDER — GABAPENTIN 300 MG PO CAPS: 300.0000 mg | ORAL_CAPSULE | Freq: Three times a day (TID) | ORAL | 0 refills | Status: DC

## 2019-08-08 MED FILL — GABAPENTIN 300 MG CAPSULE: 300 | 30 days supply | Qty: 90 | Fill #0

## 2019-08-08 NOTE — Assessment & Plan Note (Signed)
BP severely elevated at the ER likely due to pain. She also had missed doses of BP medications due to pain and not getting around good. She has been back on meds and BP normal today.

## 2019-08-08 NOTE — Progress Notes (Signed)
Subjective:   Patient ID: Kimberly Pierce, female    DOB: [birth year], 64 y.o.   MRN: [id number]  HPI The patient is a 64 YO female coming in for left leg pain and low back pain. Started about 4-6 weeks ago and overall it is not improving and some worse. She has been to ER several times about this. Initially they gave her prednisone which did not help and lidoderm patches. She then returned to the ER about 4 days ago and they did [place] leg to rule out DVT (negative) and x-ray hip and low back. She did have new age indeterminate compression fractures lumbar. She states at the onset she was lifting something heavy at work and felt something in her back. She has prior compression fractures from 2012 and is not sure if she has ever been treated for osteoporosis in the past. She is not currently on treatment. She has NASH cirrhosis so does not take tylenol. Most recently she was given naproxen and diazepam which did not help the pain at all. Denies falls. Pain radiates into the left leg. Pain 8-9/10, having no numbness or weakness in the leg. No loss of bowels or bladder.   PMH, [facility], social history reviewed and updated  Review of Systems  Constitutional: Positive for activity change. Negative for appetite change, chills, fatigue, fever and unexpected weight change.  HENT: Negative.   Eyes: Negative.   Respiratory: Negative.   Cardiovascular: Negative.   Gastrointestinal: Negative.   Musculoskeletal: Positive for arthralgias, back pain and myalgias. Negative for gait problem, joint swelling, neck pain and neck stiffness.  Skin: Negative.   Neurological: Negative.     Objective:  Physical Exam Constitutional:      Appearance: She is well-developed.  HENT:     Head: Normocephalic and atraumatic.  Cardiovascular:     Rate and Rhythm: Normal rate and regular rhythm.  Pulmonary:     Effort: Pulmonary effort is normal. No respiratory distress.     Breath sounds: Normal breath sounds. No wheezing or  rales.  Abdominal:     General: Bowel sounds are normal. There is no distension.     Palpations: Abdomen is soft.     Tenderness: There is no abdominal tenderness. There is no rebound.  Musculoskeletal:        General: Tenderness present.     Cervical back: Normal range of motion.     Comments: Pain lumbar midline and radiating down left leg  Skin:    General: Skin is warm and dry.  Neurological:     Mental Status: She is alert and oriented to person, place, and time.     Coordination: Coordination normal.     Vitals:   08/08/19 1107  BP: 128/84  Pulse: 74  Temp: 98.2 F (36.8 C)  TempSrc: Oral  SpO2: 96%  Weight: 199 lb (90.3 kg)  Height: 5' 6"  (1.676 m)    This visit occurred during the SARS-CoV-2 public health emergency.  Safety protocols were in place, including screening questions prior to the visit, additional usage of staff PPE, and extensive cleaning of exam room while observing appropriate contact time as indicated for disinfecting solutions.   Toradol 30 mg IM given at visit Assessment & Plan:  Visit time 25 minutes in face to face communication with patient and coordination of care, additional 15 minutes spent in record review, coordination or care, ordering tests, communicating/referring to other healthcare professionals, documenting in medical records all on the same day of  the visit for total time 40 minutes spent on the visit.

## 2019-08-08 NOTE — Patient Instructions (Addendum)
We have sent in gabapentin to take for pain.  Today you can take this 1 pill twice a day. Do this for 2-3 days.   If still having pain you can increase this to 1 pill 3 times a day for pain.  If this is not helping let us know.  We will work on getting prolia for you to help prevent more back fractures in the future.   We are getting an MRI of the low back and may need for you to see a back specialist to help the pain.

## 2019-08-08 NOTE — Assessment & Plan Note (Signed)
Counseled about need for further treatment given more compression fractures. She agrees to start prolia so will start authorization process for this.

## 2019-08-08 NOTE — Assessment & Plan Note (Signed)
Ordered MRI lumbar. Rx gabapentin for pain. She can continue NSAIDS at home if helping. May need referral to neurosurgery depending on MRI results. Needs treatment of her osteoporosis as this is a recurrent issue.

## 2019-08-11 ENCOUNTER — Other Ambulatory Visit: Payer: Self-pay

## 2019-08-11 ENCOUNTER — Encounter: Payer: Self-pay | Admitting: Family Medicine

## 2019-08-11 ENCOUNTER — Ambulatory Visit
Admission: RE | Admit: 2019-08-11 | Discharge: 2019-08-11 | Disposition: A | Discharge status: Home / Self Care | Payer: 59 | Source: Ambulatory Visit | Attending: Family Medicine | Admitting: Family Medicine

## 2019-08-11 ENCOUNTER — Ambulatory Visit (INDEPENDENT_AMBULATORY_CARE_PROVIDER_SITE_OTHER): Payer: 59 | Admitting: Family Medicine

## 2019-08-11 VITALS — BP 180/100 | HR 74 | Ht 66.0 in | Wt 197.4 lb

## 2019-08-11 DIAGNOSIS — Z1231 Encounter for screening mammogram for malignant neoplasm of breast: Secondary | ICD-10-CM

## 2019-08-11 DIAGNOSIS — S32000A Wedge compression fracture of unspecified lumbar vertebra, initial encounter for closed fracture: Secondary | ICD-10-CM

## 2019-08-11 MED ORDER — TRAMADOL HCL 50 MG PO TABS: 50.0000 mg | ORAL_TABLET | Freq: Three times a day (TID) | ORAL | 0 refills | Status: DC | PRN

## 2019-08-11 MED FILL — traMADol HCL 50 MG TABS: 50 | 5 days supply | Qty: 15 | Fill #0

## 2019-08-11 NOTE — Patient Instructions (Signed)
Thank you for coming in today. Plan for the back brace.  Get the MRI.  Use the tramadol for back pain and the gabapentin for nerve pain down you leg.  Let me know when you get the MRI done.  I will arrange for the injections if needed.   Let me know if you dont hear about the MRI this week.     Radicular Pain Radicular pain is a type of pain that spreads from your back or neck along a spinal nerve. Spinal nerves are nerves that leave the spinal cord and go to the muscles. Radicular pain is sometimes called radiculopathy, radiculitis, or a pinched nerve. When you have this type of pain, you may also have weakness, numbness, or tingling in the area of your body that is supplied by the nerve. The pain may feel sharp and burning. Depending on which spinal nerve is affected, the pain may occur in the:  Neck area (cervical radicular pain). You may also feel pain, numbness, weakness, or tingling in the arms.  Mid-spine area (thoracic radicular pain). You would feel this pain in the back and chest. This type is rare.  Lower back area (lumbar radicular pain). You would feel this pain as low back pain. You may feel pain, numbness, weakness, or tingling in the buttocks or legs. Sciatica is a type of lumbar radicular pain that shoots down the back of the leg. Radicular pain occurs when one of the spinal nerves becomes irritated or squeezed (compressed). It is often caused by something pushing on a spinal nerve, such as one of the bones of the spine (vertebrae) or one of the round cushions between vertebrae (intervertebral disks). This can result from:  An injury.  Wear and tear or aging of a disk.  The growth of a bone spur that pushes on the nerve. Radicular pain often goes away when you follow instructions from your health care provider for relieving pain at home. Follow these instructions at home: Managing pain      If directed, put ice on the affected area: ? Put ice in a plastic  bag. ? Place a towel between your skin and the bag. ? Leave the ice on for 20 minutes, 2-3 times a day.  If directed, apply heat to the affected area as often as told by your health care provider. Use the heat source that your health care provider recommends, such as a moist heat pack or a heating pad. ? Place a towel between your skin and the heat source. ? Leave the heat on for 20-30 minutes. ? Remove the heat if your skin turns bright red. This is especially important if you are unable to feel pain, heat, or cold. You may have a greater risk of getting burned. Activity   Do not sit or rest in bed for long periods of time.  Try to stay as active as possible. Ask your health care provider what type of exercise or activity is best for you.  Avoid activities that make your pain worse, such as bending and lifting.  Do not lift anything that is heavier than 10 lb (4.5 kg), or the limit that you are told, until your health care provider says that it is safe.  Practice using proper technique when lifting items. Proper lifting technique involves bending your knees and rising up.  Do strength and range-of-motion exercises only as told by your health care provider or physical therapist. General instructions  Take over-the-counter and prescription medicines only  as told by your health care provider.  Pay attention to any changes in your symptoms.  Keep all follow-up visits as told by your health care provider. This is important. ? Your health care provider may send you to a physical therapist to help with this pain. Contact a health care provider if:  Your pain and other symptoms get worse.  Your pain medicine is not helping.  Your pain has not improved after a few weeks of home care.  You have a fever. Get help right away if:  You have severe pain, weakness, or numbness.  You have difficulty with bladder or bowel control. Summary  Radicular pain is a type of pain that spreads  from your back or neck along a spinal nerve.  When you have radicular pain, you may also have weakness, numbness, or tingling in the area of your body that is supplied by the nerve.  The pain may feel sharp or burning.  Radicular pain may be treated with ice, heat, medicines, or physical therapy. This information is not intended to replace advice given to you by your health care provider. Make sure you discuss any questions you have with your health care provider. Document Revised: 12/08/2017 Document Reviewed: 12/08/2017 Elsevier Patient Education  [employer].    Balloon Kyphoplasty Balloon kyphoplasty is a procedure to treat a spinal compression fracture, which is a collapse of the bones that form the spine (vertebrae). With this type of fracture, the vertebrae are squeezed (compressed) into a wedge shape, and this causes pain. In this procedure, the collapsed vertebrae are expanded with a balloon, and bone cement is injected into the vertebrae to strengthen them. Tell a health care provider about:  Any allergies you have.  All medicines you are taking, including vitamins, herbs, eye drops, creams, and over-the-counter medicines.  Any problems you or family members have had with anesthetic medicines.  Any blood disorders you have.  Any surgeries you have had.  Any medical conditions you have or have had.  Whether you are pregnant or may be pregnant. What are the risks? Generally, this is a safe procedure. However, problems may occur, including:  Infection.  Bleeding.  Increased back pain.  Allergic reactions to medicines.  Damage to other structures, nerves, or organs.  Leaking of bone cement into other parts of the body. What happens before the procedure? Medicines Ask your health care provider about:  Changing or stopping your regular medicines. This is especially important if you are taking diabetes medicines or blood thinners.  Taking medicines such as  aspirin and ibuprofen. These medicines can thin your blood. Do not take these medicines unless your health care provider tells you to take them.  Taking over-the-counter medicines, vitamins, herbs, and supplements. General instructions  Follow instructions from your health care provider about eating or drinking restrictions.  Do not use any products that contain nicotine or tobacco for at least 4 weeks before the procedure. These products include cigarettes, e-cigarettes, and chewing tobacco. If you need help quitting, ask your health care provider.  Ask your health care provider: ? How your surgery site will be marked. ? What steps will be taken to help prevent infection. These may include:  Removing hair at the surgery site.  Washing skin with a germ-killing soap.  Taking antibiotic medicine.  Plan to have someone take you home from the hospital or clinic.  If you will be going home right after the procedure, plan to have someone with you for  24 hours. What happens during the procedure?   An IV will be inserted into one of your veins.  You will be given one or more of the following: ? A medicine to help you relax (sedative). ? A medicine to numb the area (local anesthetic). ? A medicine to make you fall asleep (general anesthetic).  Your surgeon will use an X-ray machine to see your spinal compression fracture.  A hollow needle will be inserted through the skin and into the spine.  Through this needle, the balloon will be placed in your spine where the fractures are located.  The balloon will be inflated. This will create space and push the bone back toward its normal height and shape.  The balloon will be removed.  The newly created space in your spine will be filled with bone cement.  When the cement hardens, the hollow needle will be removed.  The needle puncture site will be closed with skin glue or adhesive tape.  A bandage (dressing) may be used to cover the  needle puncture site. The procedure may vary among health care providers and hospitals. What happens after the procedure?  Your blood pressure, heart rate, breathing rate, and blood oxygen level will be monitored until you leave the hospital or clinic.  You will have some pain. Pain medicine will be available to help you.  An ice pack may be placed over the needle site.  Do not drive for 24 hours if you were given a sedative during your procedure. Summary  Balloon kyphoplasty is a procedure to treat a spinal compression fracture, which is a collapse of the bones that form the spine (vertebrae).  Before the procedure, follow instructions from your health care provider about eating or drinking restrictions.  Ask your health care provider about changing or stopping your regular medicines. This is especially important if you are taking diabetes medicines or blood thinners.  Plan to have someone take you home from the hospital or clinic. Do not drive for 24 hours if you were given a sedative during your procedure.  If you will be going home right after the procedure, plan to have someone with you for 24 hours. This information is not intended to replace advice given to you by your health care provider. Make sure you discuss any questions you have with your health care provider. Document Revised: 05/03/2018 Document Reviewed: 05/03/2018 Elsevier Patient Education  2020 [employer].

## 2019-08-11 NOTE — Progress Notes (Signed)
I, Kimberly Pierce, LAT, ATC, am serving as scribe for Dr. Lynne Leader.  Kimberly Pierce is a 64 y.o. female who presents to [facility] at [facility] today for low back pain and radiating L leg pain x 1.5 months after lifting something heavy at work.  Pt has been seen at the ED twice in the month of February 2021 and prescribed multiple medications to include a steroid dose pack, lidocaine patch, Robaxin, Valium and Naproxen and most recently saw her PCP on 08/08/19 and was prescribed Gabapentin.  She has an extensive workup between all these visits and had a L LE venous doppler that was negative for DVT and a L hip and L-spine XR on 08/06/19 w/ L-spine XR revealing new compression fractures at L2, L5 and L5 since prior compression fractures noted in 2012.  She describes her L leg pain as a constant, aching and throbbing pain that she rates as a 10/10.  Radiating pain: Yes in the L leg from L glute to L lower leg L LE Numbness/tingling: No L LE weakness: No Aggravating factors: Prolonged positioning; transitioning from sit-to-stand; Walking Treatments tried: steroid dose pack, lidocaine patch, Robaxin, Valium, Naproxen and Gabapentin.  Gabapentin helped leg pain but not back pain  Diagnostic imaging:   Pertinent review of systems: No fevers or chills  Relevant historical information: Liver cirrhosis due to Karlene Lineman, Vitamin D deficiency, hyperglycemia  Exam:  BP (!) 180/100 (BP Location: Left Arm, Patient Position: Sitting, Cuff Size: Large)   Pulse 74   Ht 5' 6"  (1.676 m)   Wt 197 lb 6.4 oz (89.5 kg)   SpO2 97%   BMI 31.86 kg/m  General: Well Developed, well nourished, and in no acute distress.   MSK:  Spine: Thoracic kyphosis nontender. L-spine no particular tender to midline.  Tender palpation bilateral lumbar paraspinal musculature. Significantly reduced lumbar motion. Lower extremity strength reflexes and sensation are intact distally. Antalgic gait present.    Lab  and Radiology Results EXAM: LUMBAR SPINE - COMPLETE 4+ VIEW  COMPARISON:  None.  FINDINGS: Compression fractures of all lumbar vertebra are identified. Since 2012 CT, compression fractures of the L2 INFERIOR endplate, L4 and L5 SUPERIOR endplate are new but age-indeterminate.  No subluxation.  Mild multilevel degenerative disc disease/spondylosis and mild to moderate facet arthropathy noted.  No focal bony lesions or spondylolysis noted.  Aortic atherosclerotic calcifications are present.  IMPRESSION: 1. Multiple lumbar compression fractures, with L2, L4 and L5 fractures new since 9924, but of uncertain chronicity. Correlate with pain. MRI may be helpful for further evaluation as clinically indicated. 2. Multilevel degenerative changes. 3.  Aortic Atherosclerosis (ICD10-I70.0).   Electronically Signed   By: Margarette Canada M.D.   On: 08/06/2019 10:45 I, Lynne Leader, personally (independently) visualized and performed the interpretation of the images attached in this note.     Assessment and Plan: 64 y.o. female with low back pain with left lumbar radiculopathy in an L4 dermatomal pattern.  Patient notes the gabapentin has been marginally helpful for her radicular pain but not at all helpful for her low back pain.  She is in quite a bit of distress with her back pain.  Fortunately she denies significant weakness or numbness distally.  Back pain due to vertebral compression fractures likely.  Patient also has a component of myofascial strain and dysfunction in her low back.  MRI L-spine already ordered which is reasonable.  That will be helpful for planning for kyphoplasty or vertebroplasty.  Will await results of this test.  In the meantime will use a [employer] back brace which helped quite a bit to control her pain in clinic along with limited tramadol.  For her lumbar radicular pain down her left leg will treat with gabapentin and tramadol.  MRI will be helpful for further  diagnosis of this issue.   PDMP reviewed during this encounter. No orders of the defined types were placed in this encounter.  Meds ordered this encounter  Medications  . traMADol (ULTRAM) 50 MG tablet    Sig: Take 1 tablet (50 mg total) by mouth every 8 (eight) hours as needed for severe pain.    Dispense:  15 tablet    Refill:  0     Discussed warning signs or symptoms. Please see discharge instructions. Patient expresses understanding.   The above documentation has been reviewed and is accurate and complete Lynne Leader

## 2019-08-18 ENCOUNTER — Other Ambulatory Visit: Payer: Self-pay | Admitting: Family Medicine

## 2019-08-18 MED FILL — traMADol HCL 50 MG TABS: 50 | 5 days supply | Qty: 15 | Fill #0

## 2019-08-18 NOTE — Telephone Encounter (Signed)
Pt refill routed to incorrect office.

## 2019-08-18 NOTE — Telephone Encounter (Signed)
Please advise 

## 2019-08-18 NOTE — Telephone Encounter (Signed)
Patient called following up on this request.

## 2019-08-22 ENCOUNTER — Ambulatory Visit
Admission: RE | Admit: 2019-08-22 | Discharge: 2019-08-22 | Disposition: A | Discharge status: Home / Self Care | Payer: 59 | Source: Ambulatory Visit | Attending: Internal Medicine | Admitting: Internal Medicine

## 2019-08-22 ENCOUNTER — Other Ambulatory Visit: Payer: Self-pay

## 2019-08-22 DIAGNOSIS — M5127 Other intervertebral disc displacement, lumbosacral region: Secondary | ICD-10-CM | POA: Diagnosis not present

## 2019-08-22 DIAGNOSIS — S32000A Wedge compression fracture of unspecified lumbar vertebra, initial encounter for closed fracture: Secondary | ICD-10-CM

## 2019-08-22 DIAGNOSIS — M48061 Spinal stenosis, lumbar region without neurogenic claudication: Secondary | ICD-10-CM | POA: Diagnosis not present

## 2019-08-22 DIAGNOSIS — M47812 Spondylosis without myelopathy or radiculopathy, cervical region: Secondary | ICD-10-CM | POA: Diagnosis not present

## 2019-08-24 ENCOUNTER — Telehealth: Payer: Self-pay

## 2019-08-24 ENCOUNTER — Telehealth: Payer: Self-pay | Admitting: Internal Medicine

## 2019-08-24 ENCOUNTER — Telehealth: Payer: Self-pay | Admitting: Family Medicine

## 2019-08-24 NOTE — Telephone Encounter (Signed)
Pt saw Dr. Georgina Snell 3/4 for back pain. Had her MRI and wondered what she should do next, still in pain.

## 2019-08-24 NOTE — Telephone Encounter (Signed)
MRI does show compression fractures like we discussed.  If pain is not controlled we will proceed with the back stabilizing injection procedures.  Recommend recheck in clinic with me in the near future to discuss these procedures and how you are feeling.

## 2019-08-24 NOTE — Telephone Encounter (Deleted)
Error

## 2019-08-24 NOTE — Telephone Encounter (Signed)
Called pt and relayed Dr. Clovis Riley advice.  Pt scheduled for f/u visit on 08/29/19.

## 2019-08-24 NOTE — Telephone Encounter (Signed)
New message    The patient calling saying medication is not working, still having pain in both - left legs worse.    Seen Dr. Sharlet Salina on  3.1.21

## 2019-08-24 NOTE — Telephone Encounter (Signed)
New Message:  Pt is calling to get the results from her MRI on 08/22/19

## 2019-08-24 NOTE — Telephone Encounter (Signed)
Imaging was ordered by Dr. Sharlet Salina

## 2019-08-25 NOTE — Telephone Encounter (Signed)
Results from MRI given by Kayla on 08/24/19 @ 8:53 am.

## 2019-08-25 NOTE — Telephone Encounter (Signed)
Needs follow up with sports med or pcp

## 2019-08-25 NOTE — Telephone Encounter (Signed)
Patient seeing Dr. Georgina Snell on 08/29/19.

## 2019-08-26 ENCOUNTER — Encounter: Payer: Self-pay | Admitting: *Deleted

## 2019-08-29 ENCOUNTER — Other Ambulatory Visit: Payer: Self-pay

## 2019-08-29 ENCOUNTER — Encounter: Payer: Self-pay | Admitting: Family Medicine

## 2019-08-29 ENCOUNTER — Ambulatory Visit (INDEPENDENT_AMBULATORY_CARE_PROVIDER_SITE_OTHER): Payer: 59 | Admitting: Family Medicine

## 2019-08-29 VITALS — BP 178/98 | HR 79 | Ht 66.0 in | Wt 206.0 lb

## 2019-08-29 DIAGNOSIS — M5416 Radiculopathy, lumbar region: Secondary | ICD-10-CM

## 2019-08-29 DIAGNOSIS — S32000A Wedge compression fracture of unspecified lumbar vertebra, initial encounter for closed fracture: Secondary | ICD-10-CM

## 2019-08-29 MED ORDER — PREGABALIN 75 MG PO CAPS: 75.0000 mg | ORAL_CAPSULE | Freq: Two times a day (BID) | ORAL | 3 refills | Status: DC

## 2019-08-29 MED ORDER — TRAMADOL HCL 50 MG PO TABS: ORAL_TABLET | ORAL | 0 refills | Status: DC

## 2019-08-29 MED FILL — traMADol HCL 50 MG TABS: 50 | 5 days supply | Qty: 15 | Fill #0

## 2019-08-29 MED FILL — PREGABALIN 75 MG CAPS: 75 | 30 days supply | Qty: 60 | Fill #0

## 2019-08-29 NOTE — Progress Notes (Signed)
I, [name], LAT, ATC, am serving as scribe for Dr. Lynne Leader.  Kimberly Pierce is a 64 y.o. female who presents to [place] at [facility] today for low back pain and radiating L LE pain x approximately 3 months.  She was last seen by Dr. Georgina Snell on 08/11/19 and was prescribed Tramadol.  She has a hx of a L2, L4-5 compression fractures.  She has had an extensive work-up including a L LE venous doppler, a L hip and L-spine XR on 08/06/19.  Since her last visit, pt reports that she remains unchanged.  She notes that she con't to have radiating L leg pain to her L ankle.  She states that she is having new R leg pain from her R thigh to her R knee.  She con't to take the Gabapentin prescribed by Dr. Sharlet Salina and the Tramadol from Dr. Georgina Snell.  She states that the Tramadol helps w/ her back pain.  She notes pain radiating to the left lateral calf and right anterior to medial thigh to knee. She notes the pain rating down her legs is worse than the pain in her back.  Diagnostic imaging: L hip and L-spine XR- 08/06/19; L-spine MRI- 08/22/19   Pertinent review of systems: No fevers or chills.  Relevant historical information: Pretension, cirrhosis, hyperlipidemia.   Exam:  BP (!) 178/98 (BP Location: Right Arm, Patient Position: Sitting, Cuff Size: Large)   Pulse 79   Ht 5' 6"  (1.676 m)   Wt 206 lb (93.4 kg)   SpO2 96%   BMI 33.25 kg/m  General: Well Developed, well nourished, and in no acute distress.   MSK:  L-spine: Nontender to midline. Mildly tender palpation bilateral paraspinal musculature is L-spine. Decreased lumbar motion. Lower extremity strength intact throughout. Reflexes and sensation intact. Mild antalgic gait present.    Lab and Radiology Results DG Lumbar Spine Complete  Result Date: 08/06/2019 CLINICAL DATA:  Low back pain radiating down LEFT leg for 1-1/2 months. No known injury. EXAM: LUMBAR SPINE - COMPLETE 4+ VIEW COMPARISON:  None. FINDINGS:  Compression fractures of all lumbar vertebra are identified. Since 2012 CT, compression fractures of the L2 INFERIOR endplate, L4 and L5 SUPERIOR endplate are new but age-indeterminate. No subluxation. Mild multilevel degenerative disc disease/spondylosis and mild to moderate facet arthropathy noted. No focal bony lesions or spondylolysis noted. Aortic atherosclerotic calcifications are present. IMPRESSION: 1. Multiple lumbar compression fractures, with L2, L4 and L5 fractures new since 8786, but of uncertain chronicity. Correlate with pain. MRI may be helpful for further evaluation as clinically indicated. 2. Multilevel degenerative changes. 3.  Aortic Atherosclerosis (ICD10-I70.0). Electronically Signed   By: Margarette Canada M.D.   On: 08/06/2019 10:45   MR Lumbar Spine Wo Contrast  Result Date: 08/23/2019 CLINICAL DATA:  Lumbar radiculopathy, osteoporosis EXAM: MRI LUMBAR SPINE WITHOUT CONTRAST TECHNIQUE: Multiplanar, multisequence MR imaging of the lumbar spine was performed. No intravenous contrast was administered. COMPARISON:  2012 FINDINGS: Segmentation:  Standard. Alignment: No significant listhesis. Mild endplate retropulsion associated with compression fractures. Vertebrae: Diffuse compression fractures with greatest loss of height at L2, L3, and L5. Diffuse marrow heterogeneity is likely related to osseous demineralization. There is some patchy marrow edema at L2, L3, and L5. Conus medullaris and cauda equina: Conus extends to the L1 level. Conus and cauda equina appear normal. Paraspinal and other soft tissues: Unremarkable. Disc levels: L1-L2: Mild disc bulge. Increased circumferential epidural fat. Mild canal stenosis. No significant foraminal stenosis. L2-L3: Mild  disc bulge with endplate osteophytic ridging. Mild facet arthropathy. Increased circumferential epidural fat. Moderate canal stenosis. No significant right foraminal stenosis. Minor left foraminal stenosis. L3-L4: Mild disc bulge with  endplate osteophytic ridging. Mild facet arthropathy with ligamentum flavum infolding. Increased circumferential epidural fat. No significant foraminal stenosis. L4-L5: Mild disc bulge with endplate osteophytic ridging. Mild facet arthropathy. Increased circumferential epidural fat. Marked canal stenosis. No significant foraminal stenosis. L5-S1: Mild disc bulge with endplate osteophytic ridging. Moderate right and mild left facet arthropathy with ligamentum flavum infolding. Marked canal stenosis. No significant foraminal stenosis. IMPRESSION: Diffuse compression fractures. There is some patchy marrow edema at L2, L3, and L5 that may be on a degenerative basis or reflective more recent progression of height loss. Multilevel degenerative changes as detailed above. There is significant canal stenosis, but this is primarily due to epidural lipomatosis. No high-grade foraminal stenosis. Electronically Signed   By: Macy Mis M.D.   On: 08/23/2019 09:16   MM 3D SCREEN BREAST BILATERAL  Result Date: 08/12/2019 CLINICAL DATA:  Screening. EXAM: DIGITAL SCREENING BILATERAL MAMMOGRAM WITH TOMO AND CAD COMPARISON:  Previous exam(s). ACR Breast Density Category b: There are scattered areas of fibroglandular density. FINDINGS: There are no findings suspicious for malignancy. Images were processed with CAD. IMPRESSION: No mammographic evidence of malignancy. A result letter of this screening mammogram will be mailed directly to the patient. RECOMMENDATION: Screening mammogram in one year. (Code:SM-B-01Y) BI-RADS CATEGORY  1: Negative. Electronically Signed   By: Audie Pinto M.D.   On: 08/12/2019 13:22   DG Hip Unilat With Pelvis 2-3 Views Left  Result Date: 08/06/2019 CLINICAL DATA:  LEFT leg pain for 1-1/2 months.  Initial encounter. EXAM: DG HIP (WITH OR WITHOUT PELVIS) 2-3V LEFT COMPARISON:  08/28/2014 FINDINGS: No acute fracture, subluxation or dislocation. Severe joint space narrowing, osteophytosis and  juxta-articular sclerosis within the RIGHT hip noted. The LEFT hip is unremarkable. No suspicious focal bony abnormalities noted. IMPRESSION: 1. Severe RIGHT hip degenerative changes. 2. No acute abnormality.  Unremarkable LEFT hip. Electronically Signed   By: Margarette Canada M.D.   On: 08/06/2019 10:47   VAS [place] LOWER EXTREMITY VENOUS (DVT) ([facility] and [facility] 7a-7p)  Result Date: 08/06/2019  Lower Venous DVTStudy Indications: Pain.  Comparison Study: no prior Performing Technologist: Abram Sander RVS  Examination Guidelines: A complete evaluation includes B-mode imaging, spectral Doppler, color Doppler, and power Doppler as needed of all accessible portions of each vessel. Bilateral testing is considered an integral part of a complete examination. Limited examinations for reoccurring indications may be performed as noted. The reflux portion of the exam is performed with the patient in reverse Trendelenburg.  +-----+---------------+---------+-----------+----------+--------------+ RIGHTCompressibilityPhasicitySpontaneityPropertiesThrombus Aging +-----+---------------+---------+-----------+----------+--------------+ CFV  Full           Yes      Yes                                 +-----+---------------+---------+-----------+----------+--------------+   +-------+---------------+---------+-----------+----------+--------------+ LEFT   CompressibilityPhasicitySpontaneityPropertiesThrombus Aging +-------+---------------+---------+-----------+----------+--------------+ CFV    Full           Yes      Yes                                 +-------+---------------+---------+-----------+----------+--------------+ SFJ    Full                                                        +-------+---------------+---------+-----------+----------+--------------+  FV ProxFull                                                        +-------+---------------+---------+-----------+----------+--------------+ FV Mid  Full                                                        +-------+---------------+---------+-----------+----------+--------------+ PFV    Full                                                        +-------+---------------+---------+-----------+----------+--------------+ POP    Full           Yes      Yes                                 +-------+---------------+---------+-----------+----------+--------------+ PTV    Full                                                        +-------+---------------+---------+-----------+----------+--------------+ PERO   Full                                                        +-------+---------------+---------+-----------+----------+--------------+     Summary: RIGHT: - No evidence of common femoral vein obstruction.  LEFT: - There is no evidence of deep vein thrombosis in the lower extremity.  - No cystic structure found in the popliteal fossa.  *See table(s) above for measurements and observations. Electronically signed by Servando Snare MD on 08/06/2019 at 3:58:14 PM.    Final    I, Lynne Leader, personally (independently) visualized and performed the interpretation of the images attached in this note.  X-ray L-spine MRI L-spine images reviewed.  Unable to review mammogram image or vascular study imaging.     Assessment and Plan: 64 y.o. female with low back pain with pain radiating down both legs left L5 and right L3.  MRI does show somewhat old appearing stress fractures with mild edema.  Additionally she does central canal stenosis but no severe neuroforaminal stenosis.  After reviewing MRI and physical exam with patient the radicular symptoms are the most problematic.  Doubtful that most of her pain is coming from the compression fractures at this time.  Plan for epidural steroid injection at L4-L5 to target left L5 nerve root.  We will stop gabapentin as its not helpful and try Lyrica.  Refill tramadol.  Follow-up after epidural  steroid injection.   PDMP not reviewed this encounter. Orders Placed This Encounter  Procedures  . DG INJECT DIAG/THERA/INC NEEDLE/CATH/PLC EPI/LUMB/SAC W/IMG    LUMB EPI #1 LEFT L5  UMR 206 LBS PACS (08/22/19) NO THINS/OTC*SCREENED*    Standing Status:   Future    Standing Expiration Date:   10/28/2020    Order Specific Question:   Reason for Exam (SYMPTOM  OR DIAGNOSIS REQUIRED)    Answer:   For left L5 radicuopathy. L4-L5    Order Specific Question:   Preferred Imaging Location?    Answer:   [id number]    Order Specific Question:   Radiology Contrast Protocol - do NOT remove file path    Answer:   [email or web address]   Meds ordered this encounter  Medications  . pregabalin (LYRICA) 75 MG capsule    Sig: Take 1 capsule (75 mg total) by mouth 2 (two) times daily.    Dispense:  60 capsule    Refill:  3    Replaces gabapentin  . traMADol (ULTRAM) 50 MG tablet    Sig: TAKE 1 TABLET BY MOUTH EVERY 8 HOURS AS NEEDED FOR SEVERE PAIN    Dispense:  15 tablet    Refill:  0     Discussed warning signs or symptoms. Please see discharge instructions. Patient expresses understanding.   The above documentation has been reviewed and is accurate and complete Lynne Leader

## 2019-08-29 NOTE — Patient Instructions (Signed)
Thank you for coming in today. Plan for back injection.  Recheck following injection.  STOP gabapentin,  Try lyrica.   Come back or go to the emergency room if you notice new weakness new numbness problems walking or bowel or bladder problems.  Call Phone: [phone] to schedule the back injection (epidural steroid injection.   Epidural Steroid Injection  An epidural steroid injection is a shot of steroid medicine and numbing medicine that is given into the space between the spinal cord and the bones of the back (epidural space). The shot helps relieve pain caused by an irritated or swollen nerve root. The amount of pain relief you get from the injection depends on what is causing the nerve to be swollen and irritated, and how long your pain lasts. You are more likely to benefit from this injection if your pain is strong and comes on suddenly rather than if you have had long-term (chronic) pain. Tell a health care provider about:  Any allergies you have.  All medicines you are taking, including vitamins, herbs, eye drops, creams, and over-the-counter medicines.  Any problems you or family members have had with anesthetic medicines.  Any blood disorders you have.  Any surgeries you have had.  Any medical conditions you have.  Whether you are pregnant or may be pregnant. What are the risks? Generally, this is a safe procedure. However, problems may occur, including:  Headache.  Bleeding.  Infection.  Allergic reaction to medicines.  Nerve damage. What happens before the procedure? Staying hydrated Follow instructions from your health care provider about hydration, which may include:  Up to 2 hours before the procedure - you may continue to drink clear liquids, such as water, clear fruit juice, black coffee, and plain tea. Eating and drinking restrictions Follow instructions from your health care provider about eating and drinking, which may include:  8 hours before  the procedure - stop eating heavy meals or foods, such as meat, fried foods, or fatty foods.  6 hours before the procedure - stop eating light meals or foods, such as toast or cereal.  6 hours before the procedure - stop drinking milk or drinks that contain milk.  2 hours before the procedure - stop drinking clear liquids. Medicines  You may be given medicines to lower anxiety.  Ask your health care provider about: ? Changing or stopping your regular medicines. This is especially important if you are taking diabetes medicines or blood thinners. ? Taking medicines such as aspirin and ibuprofen. These medicines can thin your blood. Do not take these medicines unless your health care provider tells you to take them. ? Taking over-the-counter medicines, vitamins, herbs, and supplements.  Ask your health care provider what steps will be taken to prevent infection. General instructions  Plan to have someone take you home from the hospital or clinic.  If you will be going home right after the procedure, plan to have someone with you for 24 hours. What happens during the procedure?  An IV will be inserted into one of your veins.  You will be given one or more of the following: ? A medicine to help you relax (sedative). ? A medicine to numb the area (local anesthetic).  You will be asked to lie on your abdomen or sit.  The injection site will be cleaned.  A needle will be inserted through your skin into the epidural space. This may cause you some discomfort. An X-ray machine will be used to guide the  needle as close as possible to the affected nerve.  A steroid medicine and a local anesthetic will be injected into the epidural space.  The needle and IV will be removed.  A bandage (dressing) will be put over the injection site. The procedure may vary among health care providers and hospitals. What can I expect after the procedure? Follow these instructions at home: Injection site  care  You may remove the bandage (dressing) after 24 hours.  Check your injection site every day for signs of infection. Check for: ? Redness, swelling, or pain. ? Fluid or blood. ? Warmth. ? Pus or a bad smell. Managing pain, stiffness, and swelling  For 24 hours after the procedure: ? Avoid using heat on the injection site. ? Do not take baths, swim, or use a hot tub until your health care provider approves. Ask your health care provider if you may take a shower. You may only be allowed to take sponge baths.  If directed, put ice on the injection site. To do this: ? Put ice in a plastic bag. ? Place a towel between your skin and the bag. ? Leave the ice on for 20 minutes, 2-3 times a day.  Activity  Do not drive for 24 hours if you were given a sedative during your procedure.  Return to your normal activities as told by your health care provider. Ask your health care provider what activities are safe for you. General instructions  Your blood pressure, heart rate, breathing rate, and blood oxygen level will be monitored until you leave the hospital or clinic.  Your arm or leg may feel weak or numb for a few hours.  The injection site may feel sore.  Take over-the-counter and prescription medicines only as told by your health care provider.  Drink enough fluid to keep your urine pale yellow.  Keep all follow-up visits as told by your health care provider. This is important. Contact a health care provider if:  You have any of these signs of infection: ? Redness, swelling, or pain around your injection site. ? Fluid or blood coming from your injection site. ? Warmth coming from your injection site. ? Pus or a bad smell coming from your injection site. ? A fever.  You continue to have pain and soreness around the injection site, even after taking over-the-counter pain medicine.  You have severe, sudden, or lasting nausea or vomiting. Get help right away if:  You have  severe pain at the injection site that is not relieved by medicines.  You develop a severe headache or a stiff neck.  You become sensitive to light.  You have any new numbness or weakness in your legs or arms.  You lose control of your bladder or bowel movements.  You have trouble breathing. Summary  An epidural steroid injection is a shot of steroid medicine and numbing medicine that is given into the epidural space.  The shot helps relieve pain caused by an irritated or swollen nerve root.  You are more likely to benefit from this injection if your pain is strong and comes on suddenly rather than if you have had chronic pain. This information is not intended to replace advice given to you by your health care provider. Make sure you discuss any questions you have with your health care provider. Document Revised: 12/06/2018 Document Reviewed: 12/06/2018 Elsevier Patient Education  [place].

## 2019-08-31 ENCOUNTER — Ambulatory Visit
Admission: RE | Admit: 2019-08-31 | Discharge: 2019-08-31 | Disposition: A | Discharge status: Home / Self Care | Payer: 59 | Source: Ambulatory Visit | Attending: Family Medicine | Admitting: Family Medicine

## 2019-08-31 ENCOUNTER — Other Ambulatory Visit: Payer: Self-pay

## 2019-08-31 DIAGNOSIS — M5416 Radiculopathy, lumbar region: Secondary | ICD-10-CM | POA: Diagnosis not present

## 2019-08-31 MED ORDER — METHYLPREDNISOLONE ACETATE 40 MG/ML INJ SUSP (RADIOLOG
120.0000 mg | Freq: Once | INTRAMUSCULAR | Status: AC
  Administered 2019-08-31: 120 mg via EPIDURAL

## 2019-08-31 MED ORDER — IOPAMIDOL (ISOVUE-M 200) INJECTION 41%
1.0000 mL | Freq: Once | INTRAMUSCULAR | Status: AC
  Administered 2019-08-31: 1 mL via EPIDURAL

## 2019-08-31 NOTE — Discharge Instructions (Signed)

## 2019-09-01 ENCOUNTER — Ambulatory Visit: Payer: 59 | Admitting: Internal Medicine

## 2019-09-12 ENCOUNTER — Ambulatory Visit (INDEPENDENT_AMBULATORY_CARE_PROVIDER_SITE_OTHER): Payer: 59

## 2019-09-12 ENCOUNTER — Ambulatory Visit (INDEPENDENT_AMBULATORY_CARE_PROVIDER_SITE_OTHER): Payer: 59 | Admitting: Family Medicine

## 2019-09-12 ENCOUNTER — Other Ambulatory Visit: Payer: Self-pay

## 2019-09-12 ENCOUNTER — Ambulatory Visit (INDEPENDENT_AMBULATORY_CARE_PROVIDER_SITE_OTHER): Payer: 59 | Admitting: *Deleted

## 2019-09-12 ENCOUNTER — Ambulatory Visit: Payer: Self-pay

## 2019-09-12 ENCOUNTER — Encounter: Payer: Self-pay | Admitting: Family Medicine

## 2019-09-12 VITALS — BP 180/90 | HR 66 | Ht 66.0 in | Wt 204.0 lb

## 2019-09-12 DIAGNOSIS — M25562 Pain in left knee: Secondary | ICD-10-CM

## 2019-09-12 DIAGNOSIS — M5416 Radiculopathy, lumbar region: Secondary | ICD-10-CM | POA: Diagnosis not present

## 2019-09-12 DIAGNOSIS — M8080XA Other osteoporosis with current pathological fracture, unspecified site, initial encounter for fracture: Secondary | ICD-10-CM | POA: Diagnosis not present

## 2019-09-12 DIAGNOSIS — S32000D Wedge compression fracture of unspecified lumbar vertebra, subsequent encounter for fracture with routine healing: Secondary | ICD-10-CM | POA: Diagnosis not present

## 2019-09-12 DIAGNOSIS — M25561 Pain in right knee: Secondary | ICD-10-CM

## 2019-09-12 MED ORDER — DENOSUMAB 60 MG/ML ~~LOC~~ SOSY
60.0000 mg | PREFILLED_SYRINGE | Freq: Once | SUBCUTANEOUS | Status: AC
  Administered 2019-09-12: 60 mg via SUBCUTANEOUS

## 2019-09-12 MED ORDER — PREGABALIN 75 MG PO CAPS: 75.0000 mg | ORAL_CAPSULE | Freq: Two times a day (BID) | ORAL | 1 refills | Status: DC

## 2019-09-12 NOTE — Patient Instructions (Signed)
Thank you for coming in today. We can repeat the back injection if the pain returns the same.  We will continue to work on the knees.  OK to use over the counter voltaren gel on the knees.  Call or go to the ER if you develop a large red swollen joint with extreme pain or oozing puss.  Recheck with me as needed.

## 2019-09-12 NOTE — Progress Notes (Signed)
Rito Ehrlich, am serving as a Education administrator for Dr. Lynne Leader.  Kimberly Pierce is a 64 y.o. female who presents to [place] at [facility] today for follow up from back injections.  Patient had a left L4 interlaminar epidural injection on March 24.  She had a complicated back history with subacute multilevel vertebral compression fractures and evidence of lumbar radiculopathy.  Since last appointment patient reports that her back is better no pain in her back.  He also notes the radiating pain she had down her left leg has significantly improved a lot as well.  Patient does report to have bilateral knee pain still going on that hurst worse when sitting to standing.  She notes this pain has been chronic for some time and been a bit unmasked since her back pain and radicular pain have been improved.  Patient needs refill on lyrica.   Pertinent review of systems: No fevers or chills  Relevant historical information: Hypertension   Exam:  BP (!) 180/90 (BP Location: Left Arm, Patient Position: Sitting, Cuff Size: Normal)   Pulse 66   Ht 5' 6"  (1.676 m)   Wt 204 lb (92.5 kg)   SpO2 98%   BMI 32.93 kg/m  General: Well Developed, well nourished, and in no acute distress.   MSK:  L-spine nontender to midline.  Decreased lumbar motion.  Right knee normal-appearing with no significant effusion. Range of motion 0-120 degrees with crepitation. Minimally tender diffusely. Stable ligamentous exam. Intact strength.  Left knee: Normal-appearing with no significant effusion Range of motion 0-120 degrees with crepitation. Not particularly tender. Stable ligamentous exam. Intact strength.    Lab and Radiology Results  Procedure: Real-time Ultrasound Guided Injection of right knee Device: Philips Affiniti 50G Images permanently stored and available for review in the ultrasound unit. Verbal informed consent obtained.  Discussed risks and benefits of procedure. Warned  about infection bleeding damage to structures skin hypopigmentation and fat atrophy among others. Patient expresses understanding and agreement Time-out conducted.   Noted no overlying erythema, induration, or other signs of local infection.   Skin prepped in a sterile fashion.   Local anesthesia: Topical Ethyl chloride.   With sterile technique and under real time ultrasound guidance:  40 mg of Kenalog and 2 mL of Marcaine injected easily.   Completed without difficulty   Pain immediately resolved suggesting accurate placement of the medication.   Advised to call if fevers/chills, erythema, induration, drainage, or persistent bleeding.   Images permanently stored and available for review in the ultrasound unit.  Impression: Technically successful ultrasound guided injection.  As patient had considerable improvement with right knee injection we both elected to proceed with left knee injection  Procedure: Real-time Ultrasound Guided Injection of left knee Device: Philips Affiniti 50G Images permanently stored and available for review in the ultrasound unit. Verbal informed consent obtained.  Discussed risks and benefits of procedure. Warned about infection bleeding damage to structures skin hypopigmentation and fat atrophy among others. Patient expresses understanding and agreement Time-out conducted.   Noted no overlying erythema, induration, or other signs of local infection.   Skin prepped in a sterile fashion.   Local anesthesia: Topical Ethyl chloride.   With sterile technique and under real time ultrasound guidance:  40 mg of Kenalog and 3 mL of Marcaine injected easily.   Completed without difficulty   Pain immediately resolved suggesting accurate placement of the medication.   Advised to call if fevers/chills, erythema, induration, drainage, or  persistent bleeding.   Images permanently stored and available for review in the ultrasound unit.  Impression: Technically successful  ultrasound guided injection.      Assessment and Plan: 64 y.o. female with  Low back pain with radiculopathy.  Partially due to vertebral compression fracture and lumbar radiculopathy.  The compression fracture pain seems to have resolved on its own with a bit of time. The radiculopathy had significant improvement following epidural steroid injection.  We will repeat injection if needed in the future otherwise continue Lyrica intermittently as needed.  Knee pain bilaterally: Due to DJD.  Considerable improvement following injection today.  Hopeful for more long-lasting injection with the steroid component.  Again recheck back with me as needed for the above issues.  Precautions reviewed.  Follow-up with PCP for elevated blood pressure.   PDMP not reviewed this encounter. Orders Placed This Encounter  Procedures  . [place] LIMITED JOINT SPACE STRUCTURES LOW BILAT(NO LINKED CHARGES)    Order Specific Question:   Reason for Exam (SYMPTOM  OR DIAGNOSIS REQUIRED)    Answer:   Knee injection    Order Specific Question:   Preferred imaging location?    Answer:   [place]   Meds ordered this encounter  Medications  . pregabalin (LYRICA) 75 MG capsule    Sig: Take 1 capsule (75 mg total) by mouth 2 (two) times daily.    Dispense:  180 capsule    Refill:  1    Replaces gabapentin     Discussed warning signs or symptoms. Please see discharge instructions. Patient expresses understanding.   The above documentation has been reviewed and is accurate and complete Lynne Leader

## 2019-09-15 NOTE — Progress Notes (Signed)
I have reviewed and agree.

## 2019-09-20 ENCOUNTER — Other Ambulatory Visit: Payer: Self-pay

## 2019-09-20 ENCOUNTER — Other Ambulatory Visit (INDEPENDENT_AMBULATORY_CARE_PROVIDER_SITE_OTHER): Payer: 59

## 2019-09-20 DIAGNOSIS — K754 Autoimmune hepatitis: Secondary | ICD-10-CM

## 2019-09-20 LAB — COMPREHENSIVE METABOLIC PANEL
ALT: 39 U/L — ABNORMAL HIGH (ref 0–35)
AST: 30 U/L (ref 0–37)
Albumin: 5 g/dL (ref 3.5–5.2)
Alkaline Phosphatase: 94 U/L (ref 39–117)
BUN: 24 mg/dL — ABNORMAL HIGH (ref 6–23)
CO2: 20 mEq/L (ref 19–32)
Calcium: 9.4 mg/dL (ref 8.4–10.5)
Chloride: 103 mEq/L (ref 96–112)
Creatinine, Ser: 0.57 mg/dL (ref 0.40–1.20)
GFR: 128.93 mL/min (ref >60.00)
Glucose, Bld: 108 mg/dL — ABNORMAL HIGH (ref 70–99)
Potassium: 4 mEq/L (ref 3.5–5.1)
Sodium: 133 mEq/L — ABNORMAL LOW (ref 135–145)
Total Bilirubin: 1 mg/dL (ref 0.2–1.2)
Total Protein: 8.7 g/dL — ABNORMAL HIGH (ref 6.0–8.3)

## 2019-09-20 LAB — CBC WITH DIFFERENTIAL/PLATELET
Basophils Absolute: 0 10*3/uL (ref 0.0–0.1)
Basophils Relative: 0.4 % (ref 0.0–3.0)
Eosinophils Absolute: 0.1 10*3/uL (ref 0.0–0.7)
Eosinophils Relative: 1.3 % (ref 0.0–5.0)
HCT: 40.1 % (ref 36.0–46.0)
Hemoglobin: 13.6 g/dL (ref 12.0–15.0)
Lymphocytes Relative: 31.4 % (ref 12.0–46.0)
Lymphs Abs: 2.9 10*3/uL (ref 0.7–4.0)
MCHC: 33.8 g/dL (ref 30.0–36.0)
MCV: 98.5 fl (ref 78.0–100.0)
Monocytes Absolute: 0.6 10*3/uL (ref 0.1–1.0)
Monocytes Relative: 7 % (ref 3.0–12.0)
Neutro Abs: 5.5 10*3/uL (ref 1.4–7.7)
Neutrophils Relative %: 59.9 % (ref 43.0–77.0)
Platelets: 228 10*3/uL (ref 150.0–400.0)
RBC: 4.07 Mil/uL (ref 3.87–5.11)
RDW: 13.3 % (ref 11.5–15.5)
WBC: 9.3 10*3/uL (ref 4.0–10.5)

## 2019-09-20 LAB — PROTIME-INR
INR: 1.1 ratio — ABNORMAL HIGH (ref 0.8–1.0)
Prothrombin Time: 11.8 s (ref 9.6–13.1)

## 2019-09-21 ENCOUNTER — Ambulatory Visit: Payer: 59

## 2019-09-28 MED FILL — PREGABALIN 75 MG CAPS: 75 | 90 days supply | Qty: 180 | Fill #0

## 2019-10-31 ENCOUNTER — Other Ambulatory Visit (INDEPENDENT_AMBULATORY_CARE_PROVIDER_SITE_OTHER): Payer: 59

## 2019-10-31 ENCOUNTER — Ambulatory Visit (INDEPENDENT_AMBULATORY_CARE_PROVIDER_SITE_OTHER): Payer: 59 | Admitting: Internal Medicine

## 2019-10-31 ENCOUNTER — Encounter: Payer: Self-pay | Admitting: Internal Medicine

## 2019-10-31 VITALS — BP 142/100 | HR 71 | Ht 66.0 in | Wt 197.8 lb

## 2019-10-31 DIAGNOSIS — K7469 Other cirrhosis of liver: Secondary | ICD-10-CM

## 2019-10-31 DIAGNOSIS — K754 Autoimmune hepatitis: Secondary | ICD-10-CM

## 2019-10-31 LAB — CBC WITH DIFFERENTIAL/PLATELET
Basophils Absolute: 0 10*3/uL (ref 0.0–0.1)
Basophils Relative: 0.7 % (ref 0.0–3.0)
Eosinophils Absolute: 0.1 10*3/uL (ref 0.0–0.7)
Eosinophils Relative: 1.8 % (ref 0.0–5.0)
HCT: 39.8 % (ref 36.0–46.0)
Hemoglobin: 13.1 g/dL (ref 12.0–15.0)
Lymphocytes Relative: 34.5 % (ref 12.0–46.0)
Lymphs Abs: 2.3 10*3/uL (ref 0.7–4.0)
MCHC: 32.8 g/dL (ref 30.0–36.0)
MCV: 101.1 fl — ABNORMAL HIGH (ref 78.0–100.0)
Monocytes Absolute: 0.6 10*3/uL (ref 0.1–1.0)
Monocytes Relative: 8.9 % (ref 3.0–12.0)
Neutro Abs: 3.6 10*3/uL (ref 1.4–7.7)
Neutrophils Relative %: 54.1 % (ref 43.0–77.0)
Platelets: 204 10*3/uL (ref 150.0–400.0)
RBC: 3.94 Mil/uL (ref 3.87–5.11)
RDW: 13.9 % (ref 11.5–15.5)
WBC: 6.6 10*3/uL (ref 4.0–10.5)

## 2019-10-31 LAB — COMPREHENSIVE METABOLIC PANEL
ALT: 47 U/L — ABNORMAL HIGH (ref 0–35)
AST: 49 U/L — ABNORMAL HIGH (ref 0–37)
Albumin: 4.7 g/dL (ref 3.5–5.2)
Alkaline Phosphatase: 105 U/L (ref 39–117)
BUN: 19 mg/dL (ref 6–23)
CO2: 24 mEq/L (ref 19–32)
Calcium: 9.7 mg/dL (ref 8.4–10.5)
Chloride: 104 mEq/L (ref 96–112)
Creatinine, Ser: 0.51 mg/dL (ref 0.40–1.20)
GFR: 146.54 mL/min (ref >60.00)
Glucose, Bld: 104 mg/dL — ABNORMAL HIGH (ref 70–99)
Potassium: 3.9 mEq/L (ref 3.5–5.1)
Sodium: 137 mEq/L (ref 135–145)
Total Bilirubin: 0.7 mg/dL (ref 0.2–1.2)
Total Protein: 8 g/dL (ref 6.0–8.3)

## 2019-10-31 LAB — PROTIME-INR
INR: 1.1 ratio — ABNORMAL HIGH (ref 0.8–1.0)
Prothrombin Time: 12 s (ref 9.6–13.1)

## 2019-10-31 NOTE — Progress Notes (Signed)
Subjective:    Patient ID: Kimberly Pierce, female    DOB: 20-Dec-1955, 64 y.o.   MRN: [id number]  HPI Kimberly Pierce is a 64 year old female with a history of cirrhosis and probable autoimmune hepatitis, hypertension, hyperlipidemia, history of remote DVT who is here for follow-up.  She is here alone today.  This is our first visit together.   She was initially seen in November 2020 by Carl Best, NP to discuss new diagnosis of cirrhosis.  At that time it was felt most likely to represent nonalcoholic fatty liver disease, however lab work was performed which revealed a positive ANA and an elevated IgG.  She underwent a liver biopsy in December 2020 to evaluate for possible autoimmune hepatitis.  This was sent for outside consultation by Dr. Luvenia Heller at [facility].  This biopsy revealed moderate interface and lobular hepatitis.  There was moderate grade 3 of 4 periseptal hepatocellular iron as well as cirrhosis.  She reported that the findings raise a broad differential and include but not limited to autoimmune hepatitis, infectious hepatitis and the effects of medications/drugs/herbal remedies and Wilson's disease.  The morphologic features were not consistent with active ongoing steatohepatitis although a component of burnt out steatohepatitis was possible.  It was felt that the lobular hepatitis was beyond what would be expected for steatohepatitis in isolation.  This biopsy prompted me to prescribe corticosteroid therapy and she took prednisone 20 mg daily without significant improvement in her liver enzymes.  This prescription expired before she followed up and most recently she has been off of all therapy.  She reports she is feeling well.  She denies abdominal pain.  No abdominal swelling or lower extremity swelling.  Good appetite.  No nausea or vomiting.  No dark urine.  No confusion.  No bleeding.  She did have a corticosteroid injection in her lower back in the last several months causing her to  miss a follow-up appointment with me.  She works in housekeeping at [employer].  She very rarely if ever drinks alcohol maybe 1 or 2 times per year on holidays.  No family history of liver disease to her knowledge.   Review of Systems As per HPI, otherwise negative  Current Medications, Allergies, Past Medical History, Past Surgical History, Family History and Social History were reviewed in [employer] record.     Objective:   Physical Exam BP (!) 142/100   Pulse 71   Ht 5' 6"  (1.676 m)   Wt 197 lb 12.8 oz (89.7 kg)   SpO2 98%   BMI 31.93 kg/m  Gen: awake, alert, NAD HEENT: anicteric, op clear CV: RRR, no mrg Pulm: CTA b/l Abd: soft, NT/ND, +BS throughout Ext: no c/c/e Neuro: nonfocal  MRI ABDOMEN WITHOUT AND WITH CONTRAST   TECHNIQUE: Multiplanar multisequence MR imaging of the abdomen was performed both before and after the administration of intravenous contrast.   CONTRAST:  74m MULTIHANCE GADOBENATE DIMEGLUMINE 529 MG/ML IV SOLN   COMPARISON:  None.   FINDINGS: Lower chest: No acute findings.   Hepatobiliary: Mild hypertrophy of left lobe and capsular nodularity is consistent with cirrhosis. No liver masses are identified. Gallbladder is unremarkable. No evidence of biliary ductal dilatation.   Pancreas:  No mass or inflammatory changes.   Spleen:  Within normal limits in size and appearance.   Adrenals/Urinary Tract: No masses identified. Tiny right renal cyst noted. No evidence of hydronephrosis.   Stomach/Bowel: Visualized portion unremarkable.   Vascular/Lymphatic: No pathologically  enlarged lymph nodes identified. No abdominal aortic aneurysm.   Other:  No evidence of ascites   Musculoskeletal:  No suspicious bone lesions identified.   IMPRESSION: Hepatic cirrhosis. No evidence of hepatic neoplasm, ascites, or other acute findings.     Electronically Signed   By: Marlaine Hind M.D.   On: 03/17/2019 15:39        Assessment & Plan:  65 year old female with a history of cirrhosis and probable autoimmune hepatitis, hypertension, hyperlipidemia, history of remote DVT who is here for follow-up.   1.  Cirrhosis in the setting of autoimmune hepatitis and probable overlapping fatty liver disease --we discussed her diagnosis together at length.  She does have cirrhosis by imaging and biopsy but it is well compensated.  There is no evidence of jaundice, ascites, encephalopathy.  No history of GI bleeding.  Her elevated IgG and positive ANA along with biopsy findings support the diagnosis of autoimmune hepatitis.  I do think that she likely has a component of "burnt out" steatohepatitis overlapping with her autoimmune hepatitis.  When I initially put her on prednisone 20 mg daily, her liver enzymes went up but this may have been related to a fatty liver inflammation.  With this in mind she may be a good candidate for budesonide and azathioprine therapy.  We discussed this today.  I have recommended the following: --Repeat labs with CBC, CMP, INR, IgG and TMPT rbc --Upper endoscopy recommended for variceal screening; we discussed the risk, benefits and alternatives to upper endoscopy and she is agreeable and wishes to proceed --We discussed Kyle screening which was negative by MRI in October 2020, repeat ultrasound in October 2020 (I am waiting 1 year to repeat screening given cross-sectional imaging rather than ultrasound only in October 2021) --She is immune to hepatitis A and B by vaccination --COVID-19 vaccination recommended we discussed this today together, she prefers either [place] or [employer] vaccination --72-monthfollow-up in the office; once I have her TPMT level I will likely start budesonide and azathioprine.  We will elect for budesonide over prednisone given probable overlapping fatty liver disease and the wish to avoid steroids when possible.  2.  CRC screening --negative Cologuard in 2019, repeat in  2022  30 minutes total spent today including patient facing time, coordination of care, reviewing medical history/procedures/pertinent radiology studies, and documentation of the encounter.

## 2019-10-31 NOTE — Patient Instructions (Addendum)
You have been scheduled for an endoscopy. Please follow written instructions given to you at your visit today. If you use inhalers (even only as needed), please bring them with you on the day of your procedure. Your physician has requested that you go to www.startemmi.com and enter the access code given to you at your visit today. This web site gives a general overview about your procedure. However, you should still follow specific instructions given to you by our office regarding your preparation for the procedure. _____________________________________________  Your provider has requested that you go to the basement level for lab work before leaving today. Press "B" on the elevator. The lab is located at the first door on the left as you exit the elevator. _______________________________________________  Kimberly Pierce will be due for an abdominal ultrasound in October 2021. We will contact you with an appointment date and time when it gets closer. _______________________________________________  Please follow up with Dr Hilarie Fredrickson on 02/01/20 at 3:20 pm. _______________________________________________  Please strongly consider getting your COVID vaccines. Call [phone] for assistance scheduling these appointments. ________________________________________________  If you are age 56 or older, your body mass index should be between 23-30. Your Body mass index is 31.93 kg/m. If this is out of the aforementioned range listed, please consider follow up with your Primary Care Provider.  If you are age 58 or younger, your body mass index should be between 19-25. Your Body mass index is 31.93 kg/m. If this is out of the aformentioned range listed, please consider follow up with your Primary Care Provider.  _______________________________________________ Due to recent changes in healthcare laws, you may see the results of your imaging and laboratory studies on MyChart before your provider has had a chance to  review them.  We understand that in some cases there may be results that are confusing or concerning to you. Not all laboratory results come back in the same time frame and the provider may be waiting for multiple results in order to interpret others.  Please give [place] 48 hours in order for your provider to thoroughly review all the results before contacting the office for clarification of your results.

## 2019-11-09 LAB — THIOPURINE METHYLTRANSFERASE (TPMT), RBC: Thiopurine Methyltransferase, RBC: 13 nmol/h/mL

## 2019-11-09 LAB — IGG: IgG (Immunoglobin G), Serum: 1433 mg/dL (ref 600–1540)

## 2019-11-10 ENCOUNTER — Ambulatory Visit (INDEPENDENT_AMBULATORY_CARE_PROVIDER_SITE_OTHER): Payer: 59

## 2019-11-10 ENCOUNTER — Other Ambulatory Visit: Payer: Self-pay | Admitting: Internal Medicine

## 2019-11-10 DIAGNOSIS — Z1159 Encounter for screening for other viral diseases: Secondary | ICD-10-CM | POA: Diagnosis not present

## 2019-11-10 LAB — SARS CORONAVIRUS 2 (TAT 6-24 HRS): SARS Coronavirus 2: NEGATIVE

## 2019-11-14 ENCOUNTER — Ambulatory Visit (AMBULATORY_SURGERY_CENTER): Payer: 59 | Admitting: Internal Medicine

## 2019-11-14 ENCOUNTER — Other Ambulatory Visit: Payer: Self-pay

## 2019-11-14 ENCOUNTER — Encounter: Payer: Self-pay | Admitting: Internal Medicine

## 2019-11-14 VITALS — BP 185/68 | HR 57 | Temp 96.8°F | Resp 17 | Ht 66.0 in | Wt 197.0 lb

## 2019-11-14 DIAGNOSIS — K119 Disease of salivary gland, unspecified: Secondary | ICD-10-CM | POA: Diagnosis not present

## 2019-11-14 DIAGNOSIS — K449 Diaphragmatic hernia without obstruction or gangrene: Secondary | ICD-10-CM | POA: Diagnosis not present

## 2019-11-14 DIAGNOSIS — K209 Esophagitis, unspecified without bleeding: Secondary | ICD-10-CM

## 2019-11-14 DIAGNOSIS — K7460 Unspecified cirrhosis of liver: Secondary | ICD-10-CM

## 2019-11-14 DIAGNOSIS — K2951 Unspecified chronic gastritis with bleeding: Secondary | ICD-10-CM | POA: Diagnosis not present

## 2019-11-14 DIAGNOSIS — K317 Polyp of stomach and duodenum: Secondary | ICD-10-CM

## 2019-11-14 DIAGNOSIS — K2900 Acute gastritis without bleeding: Secondary | ICD-10-CM

## 2019-11-14 DIAGNOSIS — K2100 Gastro-esophageal reflux disease with esophagitis, without bleeding: Secondary | ICD-10-CM | POA: Diagnosis not present

## 2019-11-14 DIAGNOSIS — K754 Autoimmune hepatitis: Secondary | ICD-10-CM

## 2019-11-14 DIAGNOSIS — K2980 Duodenitis without bleeding: Secondary | ICD-10-CM

## 2019-11-14 DIAGNOSIS — K2970 Gastritis, unspecified, without bleeding: Secondary | ICD-10-CM | POA: Diagnosis not present

## 2019-11-14 MED ORDER — SODIUM CHLORIDE 0.9 % IV SOLN: 500.0000 mL | Freq: Once | INTRAVENOUS | Status: DC

## 2019-11-14 NOTE — Progress Notes (Signed)
Called to room to assist during endoscopic procedure.  Patient ID and intended procedure confirmed with present staff. Received instructions for my participation in the procedure from the performing physician.  

## 2019-11-14 NOTE — Progress Notes (Signed)
Pt's states no medical or surgical changes since previsit or office visit. 

## 2019-11-14 NOTE — Op Note (Signed)
[place] Patient Name: Kimberly Pierce Procedure Date: 11/14/2019 10:15 AM MRN: [id number] Endoscopist: Jerene Bears , MD Age: 64 Referring MD:  Date of Birth: [birth year] Gender: Female Account #: [id number] Procedure:                Upper GI endoscopy Indications:              Cirrhosis rule out esophageal varices Medicines:                Monitored Anesthesia Care Procedure:                Pre-Anesthesia Assessment:                           - Prior to the procedure, a History and Physical                            was performed, and patient medications and                            allergies were reviewed. The patient's tolerance of                            previous anesthesia was also reviewed. The risks                            and benefits of the procedure and the sedation                            options and risks were discussed with the patient.                            All questions were answered, and informed consent                            was obtained. Prior Anticoagulants: The patient has                            taken no previous anticoagulant or antiplatelet                            agents. ASA Grade Assessment: III - A patient with                            severe systemic disease. After reviewing the risks                            and benefits, the patient was deemed in                            satisfactory condition to undergo the procedure.                           After obtaining informed consent, the endoscope was  passed under direct vision. Throughout the                            procedure, the patient's blood pressure, pulse, and                            oxygen saturations were monitored continuously. The                            Endoscope was introduced through the mouth, and                            advanced to the second part of duodenum. The upper                            GI endoscopy was  accomplished without difficulty.                            The patient tolerated the procedure well. Scope In: Scope Out: Findings:                 LA Grade A (one or more mucosal breaks less than 5                            mm, not extending between tops of 2 mucosal folds)                            esophagitis was found at the gastroesophageal                            junction.                           A 2 cm hiatal hernia was present.                           There is no endoscopic evidence of varices in the                            entire esophagus.                           Moderate inflammation characterized by congestion                            (edema), erosions and erythema was found at the                            incisura and in the gastric antrum. Biopsies were                            taken with a cold forceps for histology and                            Helicobacter  pylori testing.                           There is no endoscopic evidence of varices or                            portal hypertension gastropathy in the entire                            examined stomach.                           Mild inflammation characterized by erythema was                            found in the duodenal bulb.                           A single 10 mm sessile polyp was found in the                            second portion of the duodenum. Biopsy not obtained                            as this has the appearance of an adenoma which will                            need EMR. This is not located at the ampulla. Complications:            No immediate complications. Estimated Blood Loss:     Estimated blood loss was minimal. Impression:               - LA Grade A (mild) reflux esophagitis.                           - 2 cm hiatal hernia.                           - Gastritis. Biopsied.                           - Mild peptic duodenitis.                           - A single  duodenal polyp in the second portion.                           - No evidence of gastroesophageal varices or other                            findings associated with portal hypertension. Recommendation:           - Patient has a contact number available for                            emergencies. The signs and symptoms of potential  delayed complications were discussed with the                            patient. Return to normal activities tomorrow.                            Written discharge instructions were provided to the                            patient.                           - Resume previous diet.                           - Continue present medications.                           - Await pathology results.                           - I anticipate the need for repeat EGD for                            endoscopic mucosal resection of the duodenal polyp.                            This will need to occur in the outpatient hospital                            setting.                           - Repeat EGD recommended for variceal screening in                            2 years. Jerene Bears, MD 11/14/2019 10:41:12 AM This report has been signed electronically.

## 2019-11-14 NOTE — Patient Instructions (Addendum)
YOU HAD AN ENDOSCOPIC PROCEDURE TODAY AT [facility]:   Refer to the procedure report that was given to you for any specific questions about what was found during the examination.  If the procedure report does not answer your questions, please call your gastroenterologist to clarify.  If you requested that your care partner not be given the details of your procedure findings, then the procedure report has been included in a sealed envelope for you to review at your convenience later.  YOU SHOULD EXPECT: Some feelings of bloating in the abdomen. Passage of more gas than usual.  Walking can help get rid of the air that was put into your GI tract during the procedure and reduce the bloating. If you had a lower endoscopy (such as a colonoscopy or flexible sigmoidoscopy) you may notice spotting of blood in your stool or on the toilet paper. If you underwent a bowel prep for your procedure, you may not have a normal bowel movement for a few days.  **Handouts given on Gastritis, esophagitis and Hiatal Hernia**   Please Note:  You might notice some irritation and congestion in your nose or some drainage.  This is from the oxygen used during your procedure.  There is no need for concern and it should clear up in a day or so.  SYMPTOMS TO REPORT IMMEDIATELY:   Following upper endoscopy (EGD)  Vomiting of blood or coffee ground material  New chest pain or pain under the shoulder blades  Painful or persistently difficult swallowing  New shortness of breath  Fever of 100F or higher  Black, tarry-looking stools   For urgent or emergent issues, a gastroenterologist can be reached at any hour by calling [phone]. Do not use MyChart messaging for urgent concerns.    DIET:  We do recommend a small meal at first, but then you may proceed to your regular diet.  Drink plenty of fluids but you should avoid alcoholic beverages for 24 hours.  ACTIVITY:  You should plan to take it easy for  the rest of today and you should NOT DRIVE or use heavy machinery until tomorrow (because of the sedation medicines used during the test).    FOLLOW UP: Our staff will call the number listed on your records 48-72 hours following your procedure to check on you and address any questions or concerns that you may have regarding the information given to you following your procedure. If we do not reach you, we will leave a message.  We will attempt to reach you two times.  During this call, we will ask if you have developed any symptoms of COVID 19. If you develop any symptoms (ie: fever, flu-like symptoms, shortness of breath, cough etc.) before then, please call [phone].  If you test positive for Covid 19 in the 2 weeks post procedure, please call and report this information to [place].    If any biopsies were taken you will be contacted by phone or by letter within the next 1-3 weeks.  Please call us at [phone] if you have not heard about the biopsies in 3 weeks.    SIGNATURES/CONFIDENTIALITY: You and/or your care partner have signed paperwork which will be entered into your electronic medical record.  These signatures attest to the fact that that the information above on your After Visit Summary has been reviewed and is understood.  Full responsibility of the confidentiality of this discharge information lies with you and/or your care-partner.

## 2019-11-14 NOTE — Progress Notes (Signed)
pt tolerated well. VSS. awake and to recovery. Report given to RN.  

## 2019-11-15 ENCOUNTER — Telehealth: Payer: Self-pay

## 2019-11-15 NOTE — Telephone Encounter (Signed)
Schedule for August or Sept.  Staff message to call pt in a few weeks.

## 2019-11-15 NOTE — Telephone Encounter (Signed)
-----   Message from Irving Copas., MD sent at 11/14/2019  5:04 PM EDT ----- Happy to help. They come in waves at times. If you talk with her, I am OK to proceed with EGD without a clinic visit for her as well, based on the size/description you have placed. Once we hear from [facility] [place] please move forward with scheduling an EMR 90 minute case OK for August or September. Thanks. 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 Would you be able to help later this summer with EMR? Already discussed it with her Thanks Ulice Dash

## 2019-11-15 NOTE — Progress Notes (Signed)
I discussed this with her and she is anticipating EGD for EMR of duodenal polyp performed at [facility] later this summer Thanks for your help JMP

## 2019-11-16 ENCOUNTER — Telehealth: Payer: Self-pay | Admitting: *Deleted

## 2019-11-16 NOTE — Telephone Encounter (Signed)
  Follow up Call-  Call back number 11/14/2019  Post procedure Call Back phone  # [phone]  Permission to leave phone message Yes  Some recent data might be hidden     Patient questions:  Do you have a fever, pain , or abdominal swelling? No. Pain Score  0 *  Have you tolerated food without any problems? Yes.    Have you been able to return to your normal activities? Yes.    Do you have any questions about your discharge instructions: Diet   No. Medications  No. Follow up visit  No.  Do you have questions or concerns about your Care? No.  Actions: * If pain score is 4 or above: No action needed, pain <4.  1. Have you developed a fever since your procedure? no  2.   Have you had an respiratory symptoms (SOB or cough) since your procedure? no  3.   Have you tested positive for COVID 19 since your procedure no  4.   Have you had any family members/close contacts diagnosed with the COVID 19 since your procedure?  no   If yes to any of these questions please route to Joylene John, RN and Erenest Rasher, RN

## 2019-11-16 NOTE — Telephone Encounter (Signed)
°  Follow up Call-  Call back number 11/14/2019  Post procedure Call Back phone  # [phone]  Permission to leave phone message Yes  Some recent data might be hidden     Patient questions:  Duplicate

## 2019-11-21 ENCOUNTER — Telehealth: Payer: Self-pay

## 2019-11-21 NOTE — Telephone Encounter (Signed)
-----   Message from Irving Copas., MD sent at 11/16/2019  8:11 AM EDT ----- Thanks for update. Timmie Dugue please move forward with scheduling EMR 90 minute case for August/September of this year. No clinic visit necessary as Dr. Hilarie Fredrickson has already discussed. Thanks.GM ----- Message ----- From: Jerene Bears, MD Sent: 11/15/2019   6:18 PM EDT To: Timothy Lasso, RN, Irving Copas., MD    ----- Message ----- From: Irving Copas., MD Sent: 11/14/2019   5:04 PM EDT To: Timothy Lasso, RN, Jerene Bears, MD  Happy to help. They come in waves at times. If you talk with her, I am OK to proceed with EGD without a clinic visit for her as well, based on the size/description you have placed. Once we hear from [facility], [place] please move forward with scheduling an EMR 90 minute case OK for August or September. Thanks. 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 Would you be able to help later this summer with EMR? Already discussed it with her Thanks Ulice Dash

## 2019-11-22 NOTE — Telephone Encounter (Signed)
Left message on machine to call back  

## 2019-11-25 ENCOUNTER — Other Ambulatory Visit: Payer: Self-pay

## 2019-11-25 DIAGNOSIS — D132 Benign neoplasm of duodenum: Secondary | ICD-10-CM

## 2019-11-25 NOTE — Telephone Encounter (Signed)
EGD EMR scheduled for 01/16/20 at 1245 pm at [facility] with Dr Mansouraty-COVID test on 01/12/20 at 1010 am

## 2019-11-28 NOTE — Telephone Encounter (Signed)
EGD EMR  scheduled, pt instructed and medications reviewed.  Patient instructions mailed to home.  Patient to call with any questions or concerns.

## 2019-12-29 ENCOUNTER — Encounter: Payer: Self-pay | Admitting: *Deleted

## 2020-01-12 ENCOUNTER — Encounter (HOSPITAL_COMMUNITY): Payer: Self-pay | Admitting: Gastroenterology

## 2020-01-12 ENCOUNTER — Other Ambulatory Visit: Payer: Self-pay

## 2020-01-12 ENCOUNTER — Inpatient Hospital Stay (HOSPITAL_COMMUNITY): Admission: RE | Admit: 2020-01-12 | Payer: 59 | Source: Ambulatory Visit

## 2020-01-12 NOTE — Progress Notes (Signed)
Pt denies SOB, chest pain, and being under the care of a cardiologist. Pt stated that PCP is Dr. [name]. Pt denies having a cardiac cath. Pt denies having a chest x ray. Pt denies recent labs. Pt staetd that she was instructed to be NPO after 7:00 P.M. the night before surgery; pt advised to follow surgeon's instructions. Pt made aware to stop taking  Aspirin (unless otherwise advised by surgeon), vitamins, fish oil, Turmeric and herbal medications. Do not take any NSAIDs ie: Ibuprofen, Advil, Naproxen (Aleve), Motrin, BC and Goody Powder. Pt reminded to quarantine. Pt verbalized understanding of all pre-op instructions.

## 2020-01-14 ENCOUNTER — Other Ambulatory Visit (HOSPITAL_COMMUNITY)
Admission: RE | Admit: 2020-01-14 | Discharge: 2020-01-14 | Disposition: A | Discharge status: Home / Self Care | Payer: 59 | Source: Ambulatory Visit | Attending: Gastroenterology | Admitting: Gastroenterology

## 2020-01-14 DIAGNOSIS — Z20822 Contact with and (suspected) exposure to covid-19: Secondary | ICD-10-CM | POA: Diagnosis not present

## 2020-01-14 DIAGNOSIS — Z01812 Encounter for preprocedural laboratory examination: Secondary | ICD-10-CM | POA: Insufficient documentation

## 2020-01-14 LAB — SARS CORONAVIRUS 2 (TAT 6-24 HRS): SARS Coronavirus 2: NEGATIVE

## 2020-01-16 ENCOUNTER — Ambulatory Visit (HOSPITAL_COMMUNITY)
Admission: RE | Admit: 2020-01-16 | Discharge: 2020-01-16 | Disposition: A | Discharge status: Home / Self Care | Payer: 59 | Attending: Gastroenterology | Admitting: Gastroenterology

## 2020-01-16 ENCOUNTER — Ambulatory Visit (HOSPITAL_COMMUNITY): Payer: 59 | Admitting: Anesthesiology

## 2020-01-16 ENCOUNTER — Encounter (HOSPITAL_COMMUNITY)
Admission: RE | Disposition: A | Discharge status: Home / Self Care | Payer: Self-pay | Source: Home / Self Care | Attending: Gastroenterology

## 2020-01-16 ENCOUNTER — Encounter (HOSPITAL_COMMUNITY): Payer: Self-pay | Admitting: Gastroenterology

## 2020-01-16 ENCOUNTER — Other Ambulatory Visit: Payer: Self-pay

## 2020-01-16 DIAGNOSIS — K2970 Gastritis, unspecified, without bleeding: Secondary | ICD-10-CM | POA: Insufficient documentation

## 2020-01-16 DIAGNOSIS — K317 Polyp of stomach and duodenum: Secondary | ICD-10-CM | POA: Diagnosis not present

## 2020-01-16 DIAGNOSIS — K209 Esophagitis, unspecified without bleeding: Secondary | ICD-10-CM | POA: Diagnosis not present

## 2020-01-16 DIAGNOSIS — K754 Autoimmune hepatitis: Secondary | ICD-10-CM | POA: Insufficient documentation

## 2020-01-16 DIAGNOSIS — K7460 Unspecified cirrhosis of liver: Secondary | ICD-10-CM | POA: Insufficient documentation

## 2020-01-16 DIAGNOSIS — Z86718 Personal history of other venous thrombosis and embolism: Secondary | ICD-10-CM | POA: Insufficient documentation

## 2020-01-16 DIAGNOSIS — K449 Diaphragmatic hernia without obstruction or gangrene: Secondary | ICD-10-CM | POA: Diagnosis not present

## 2020-01-16 DIAGNOSIS — D132 Benign neoplasm of duodenum: Secondary | ICD-10-CM | POA: Diagnosis not present

## 2020-01-16 DIAGNOSIS — F17210 Nicotine dependence, cigarettes, uncomplicated: Secondary | ICD-10-CM | POA: Diagnosis not present

## 2020-01-16 DIAGNOSIS — I10 Essential (primary) hypertension: Secondary | ICD-10-CM | POA: Diagnosis not present

## 2020-01-16 HISTORY — PX: ESOPHAGOGASTRODUODENOSCOPY (EGD) WITH PROPOFOL: SHX5813

## 2020-01-16 HISTORY — PX: HEMOSTASIS CLIP PLACEMENT: SHX6857

## 2020-01-16 HISTORY — PX: POLYPECTOMY: SHX5525

## 2020-01-16 HISTORY — DX: Allergy status to unspecified drugs, medicaments and biological substances: Z88.9

## 2020-01-16 HISTORY — PX: SUBMUCOSAL TATTOO INJECTION: SHX6856

## 2020-01-16 HISTORY — PX: ENDOSCOPIC MUCOSAL RESECTION: SHX6839

## 2020-01-16 HISTORY — DX: Benign neoplasm of duodenum: D13.2

## 2020-01-16 HISTORY — PX: SUBMUCOSAL LIFTING INJECTION: SHX6855

## 2020-01-16 SURGERY — ESOPHAGOGASTRODUODENOSCOPY (EGD) WITH PROPOFOL
Anesthesia: Monitor Anesthesia Care

## 2020-01-16 MED ORDER — OMEPRAZOLE 40 MG PO CPDR: 40.0000 mg | DELAYED_RELEASE_CAPSULE | Freq: Two times a day (BID) | ORAL | 4 refills | Status: DC

## 2020-01-16 MED ORDER — SPOT INK MARKER SYRINGE KIT
PACK | SUBMUCOSAL | Status: AC
  Filled 2020-01-16: qty 5

## 2020-01-16 MED ORDER — GLUCAGON HCL RDNA (DIAGNOSTIC) 1 MG IJ SOLR
INTRAMUSCULAR | Status: AC
  Filled 2020-01-16: qty 1

## 2020-01-16 MED ORDER — GLUCAGON HCL RDNA (DIAGNOSTIC) 1 MG IJ SOLR
INTRAMUSCULAR | Status: DC | PRN
  Administered 2020-01-16 (×3): .25 mg via INTRAVENOUS

## 2020-01-16 MED ORDER — LACTATED RINGERS IV SOLN: INTRAVENOUS | Status: DC | PRN

## 2020-01-16 MED ORDER — LIDOCAINE 2% (20 MG/ML) 5 ML SYRINGE
INTRAMUSCULAR | Status: DC | PRN
  Administered 2020-01-16: 80 mg via INTRAVENOUS

## 2020-01-16 MED ORDER — PROPOFOL 500 MG/50ML IV EMUL
INTRAVENOUS | Status: DC | PRN
  Administered 2020-01-16: 100 ug/kg/min via INTRAVENOUS

## 2020-01-16 MED ORDER — PROPOFOL 10 MG/ML IV BOLUS
INTRAVENOUS | Status: DC | PRN
  Administered 2020-01-16: 30 mg via INTRAVENOUS
  Administered 2020-01-16: 50 mg via INTRAVENOUS
  Administered 2020-01-16: 30 mg via INTRAVENOUS
  Administered 2020-01-16: 50 mg via INTRAVENOUS
  Administered 2020-01-16 (×2): 40 mg via INTRAVENOUS

## 2020-01-16 MED ORDER — SODIUM CHLORIDE 0.9 % IV SOLN: INTRAVENOUS | Status: DC

## 2020-01-16 MED ORDER — SPOT INK MARKER SYRINGE KIT
PACK | SUBMUCOSAL | Status: DC | PRN
  Administered 2020-01-16: 1 mL via SUBMUCOSAL

## 2020-01-16 MED FILL — OMEPRAZOLE 40 MG CPDR: 40 | 30 days supply | Qty: 60 | Fill #0

## 2020-01-16 SURGICAL SUPPLY — 14 items

## 2020-01-16 NOTE — Discharge Instructions (Signed)
YOU HAD AN ENDOSCOPIC PROCEDURE TODAY: Refer to the procedure report and other information in the discharge instructions given to you for any specific questions about what was found during the examination. If this information does not answer your questions, please call [place] office at [phone] to clarify.   YOU SHOULD EXPECT: Some feelings of bloating in the abdomen. Passage of more gas than usual. Walking can help get rid of the air that was put into your GI tract during the procedure and reduce the bloating. If you had a lower endoscopy (such as a colonoscopy or flexible sigmoidoscopy) you may notice spotting of blood in your stool or on the toilet paper. Some abdominal soreness may be present for a day or two, also.  DIET: Your first meal following the procedure should be a light meal and then it is ok to progress to your normal diet. A half-sandwich or bowl of soup is an example of a good first meal. Heavy or fried foods are harder to digest and may make you feel nauseous or bloated. Drink plenty of fluids but you should avoid alcoholic beverages for 24 hours. If you had a esophageal dilation, please see attached instructions for diet.    ACTIVITY: Your care partner should take you home directly after the procedure. You should plan to take it easy, moving slowly for the rest of the day. You can resume normal activity the day after the procedure however YOU SHOULD NOT DRIVE, use power tools, machinery or perform tasks that involve climbing or major physical exertion for 24 hours (because of the sedation medicines used during the test).   SYMPTOMS TO REPORT IMMEDIATELY: A gastroenterologist can be reached at any hour. Please call [phone]  for any of the following symptoms:   Following upper endoscopy (EGD, EUS, ERCP, esophageal dilation) Vomiting of blood or coffee ground material  New, significant abdominal pain  New, significant chest pain or pain under the shoulder blades  Painful or  persistently difficult swallowing  New shortness of breath  Black, tarry-looking or red, bloody stools  FOLLOW UP:  If any biopsies were taken you will be contacted by phone or by letter within the next 1-3 weeks. Call [phone]  if you have not heard about the biopsies in 3 weeks.  Please also call with any specific questions about appointments or follow up tests.  Full Liquid Diet A full liquid diet refers to fluids and foods that are liquid or will become liquid at room temperature. This diet should only be used for a short period of time to help you recover from illness or surgery. Your health care provider or dietitian will help you determine when it is safe to eat regular foods. What are tips for following this plan?     Reading food labels  Check food labels of nutrition shakes for the amount of protein. Look for nutrition shakes that have at least 8-10 grams of protein in each serving.  Look for drinks, such as milks and juices, that are "fortified" or "enriched." This means that vitamins and minerals have been added. Shopping  Buy premade nutritional shakes to keep on hand.  To vary your choices, buy different flavors of milks and shakes. Meal planning  Choose flavors and foods that you enjoy.  To make sure you get enough energy from food (calories): ? Eat 3 full liquid meals each day. Have a liquid snack between each meal. ? Drink 6-8 ounces (177-237 ml) of a nutrition supplement shake with meals  or as snacks. ? Add protein powder, powdered milk, milk, or yogurt to shakes to increase the amount of protein.  Drink at least one serving a day of citrus fruit juice or fruit juice that has vitamin C added. General guidelines  Before starting the full-liquid diet, check with your health care provider to know what foods you should avoid. These may include full-fat or high-fiber liquids.  You may have any liquid or food that becomes a liquid at room temperature. The food is  considered a liquid if it can be poured off a spoon at room temperature.  Do not drink alcohol unless approved by your health care provider.  This diet gives you most of the nutrients that you need for energy, but you may not get enough of certain vitamins, minerals, and fiber. Make sure to talk to your health care provider or dietitian about: ? How many calories you need to eat get day. ? How much fluid you should have each day. ? Taking a multivitamin or a nutritional supplement. What foods are allowed? The items listed may not be a complete list. Talk with your dietitian about what dietary choices are best for you. Grains Thin hot cereal, such as cream of wheat. Soft-cooked pasta or rice pured in soup. Vegetables Pulp-free tomato or vegetable juice. Vegetables pured in soup. Fruits Fruit juice without pulp. Strained fruit pures (seeds and skins removed). Meats and other protein foods Beef, chicken, and fish broths. Powdered protein supplements. Dairy Milk and milk-based beverages, including milk shakes and instant breakfast mixes. Smooth yogurt. Pured cottage cheese. Beverages Water. Coffee and tea (caffeinated or decaffeinated). Cocoa. Liquid nutritional supplements. Soft drinks. Nondairy milks, such as almond, coconut, rice, or soy milk. Fats and oils Melted margarine and butter. Cream. Canola, almond, avocado, corn, grapeseed, sunflower, and sesame oils. Gravy. Sweets and desserts Custard. Pudding. Flavored gelatin. Smooth ice cream (without nuts or candy pieces). Sherbet. Popsicles. [place] ice. Pudding pops. Seasoning and other foods Salt and pepper. Spices. Cocoa powder. Vinegar. Ketchup. Yellow mustard. Smooth sauces, such as Hollandaise, cheese sauce, or white sauce. Soy sauce. Cream soups. Strained soups. Syrup. Honey. Jelly (without fruit pieces). What foods are not allowed? The items listed may not be a complete list. Talk with your dietitian about what dietary choices  are best for you. Grains Whole grains. Pasta. Rice. Cold cereal. Bread. Crackers. Vegetables All whole fresh, frozen, or canned vegetables. Fruits All whole fresh, frozen, or canned fruits. Meats and other protein foods All cuts of meat, poultry, and fish. Eggs. Tofu and soy protein. Nuts and nut butters. Lunch meat. Sausage. Dairy Hard cheese. Yogurt with fruit chunks. Fats and oils Coconut oil. Palm oil. Lard. Cold butter. Sweets and desserts Ice cream or other frozen desserts that have any solids in them or on top, such as nuts, chocolate chips, and pieces of cookies. Cakes. Cookies. Candy. Seasoning and other foods Stone-ground mustards. Soups with chunks or pieces. Summary  A full liquid diet refers to fluids and foods that are liquid or will become liquid at room temperature.  This diet should only be used for a short period of time to help you recover from illness or surgery. Ask your health care provider or dietitian when it is safe for you to eat regular foods.  To make sure you get enough calories and nutrients, eat 3 meals each day with snacks between. Drink premade nutrition supplement shakes or add protein powder to homemade shakes. Take a vitamin and mineral supplement as  told by your health care provider. This information is not intended to replace advice given to you by your health care provider. Make sure you discuss any questions you have with your health care provider. Document Revised: 08/22/2017 Document Reviewed: 07/09/2016 Elsevier Patient Education  2020 [employer].

## 2020-01-16 NOTE — Transfer of Care (Signed)
Immediate Anesthesia Transfer of Care Note  Patient: Kimberly Pierce  Procedure(s) Performed: ESOPHAGOGASTRODUODENOSCOPY (EGD) WITH PROPOFOL (N/A ) ENDOSCOPIC MUCOSAL RESECTION (N/A ) POLYPECTOMY SUBMUCOSAL TATTOO INJECTION SUBMUCOSAL LIFTING INJECTION HEMOSTASIS CLIP PLACEMENT  Patient Location: Endoscopy Unit  Anesthesia Type:MAC  Level of Consciousness: awake, alert  and oriented  Airway & Oxygen Therapy: Patient Spontanous Breathing  Post-op Assessment: Report given to RN, Post -op Vital signs reviewed and stable and Patient moving all extremities  Post vital signs: Reviewed and stable  Last Vitals:  Vitals Value Taken Time  BP 158/82 01/16/20 1350  Temp    Pulse 62 01/16/20 1352  Resp 19 01/16/20 1352  SpO2 99 % 01/16/20 1352  Vitals shown include unvalidated device data.  Last Pain:  Vitals:   01/16/20 1206  TempSrc: Axillary  PainSc: 0-No pain         Complications: No complications documented.

## 2020-01-16 NOTE — Anesthesia Procedure Notes (Signed)
Procedure Name: MAC Date/Time: 01/16/2020 12:42 PM Performed by: Kyung Rudd, CRNA Pre-anesthesia Checklist: Patient identified, Emergency Drugs available, Suction available and Patient being monitored Patient Re-evaluated:Patient Re-evaluated prior to induction Oxygen Delivery Method: Nasal cannula Induction Type: IV induction Placement Confirmation: positive ETCO2 Dental Injury: Teeth and Oropharynx as per pre-operative assessment

## 2020-01-16 NOTE — Op Note (Signed)
[facility] Patient Name: Kimberly Pierce Procedure Date : 01/16/2020 MRN: [id number] Attending MD:  Mansouraty , MD Date of Birth: 07/08/1955 CSN: [id number] Age: 64 Admit Type: Outpatient Procedure:                Upper GI endoscopy Indications:              Polyps in the duodenum, For therapy of polyps in                            the duodenum Providers:                 Mansouraty, MD, Hayleigh Westmoreland, RN,                            Carrie Baker, RN, Chris Chandler Tech., Technician,                            Elena Walker, CRNA Referring MD:             Jay M. Pyrtle, MD, Thomas L. Jones, MD Medicines:                Monitored Anesthesia Care Complications:            No immediate complications. Estimated Blood Loss:     Estimated blood loss was minimal. Procedure:                Pre-Anesthesia Assessment:                           - Prior to the procedure, a History and Physical                            was performed, and patient medications and                            allergies were reviewed. The patient's tolerance of                            previous anesthesia was also reviewed. The risks                            and benefits of the procedure and the sedation                            options and risks were discussed with the patient.                            All questions were answered, and informed consent                            was obtained. Prior Anticoagulants: The patient has                            taken no previous anticoagulant or antiplatelet                              agents. ASA Grade Assessment: III - A patient with                            severe systemic disease. After reviewing the risks                            and benefits, the patient was deemed in                            satisfactory condition to undergo the procedure.                           After obtaining informed consent, the endoscope was                             passed under direct vision. Throughout the                            procedure, the patient's blood pressure, pulse, and                            oxygen saturations were monitored continuously. The                            GIF-H190 ([id number]) Olympus gastroscope was                            introduced through the mouth, and advanced to the                            second part of duodenum. The upper GI endoscopy was                            accomplished without difficulty. The patient                            tolerated the procedure. Scope In: Scope Out: Findings:      No gross lesions were noted in the proximal esophagus and in the mid       esophagus.      LA Grade B (one or more mucosal breaks greater than 5 mm, not extending       between the tops of two mucosal folds) esophagitis was found in the       distal esophagus to the GE Junction.      A small hiatal hernia was present.      Patchy moderate inflammation characterized by congestion (edema),       erosions, erythema and granularity was found in the entire examined       stomach. Previously biopsied and negative for H.P.      A single 18 mm sessile polyp with no bleeding was found in the second       portion of the duodenum. This was on the contralateral wall to the       ampulla and slightly distal. The prime location for attempt at resection         was in a long secondary position of the endoscope. Preparations were       made for mucosal resection. NBI and white light was done to demarcate       the borders of the lesion. Orise gel was injected to raise the lesion.       Piecemeal mucosal resection using a snare was performed. Resection and       retrieval were complete. To prevent bleeding after mucosal resection,       seven hemostatic clips were successfully placed (MR conditional). There       was no bleeding at the end of the procedure. Area just proximal and on       contralateral wall  was tattooed with an injection of Spot (carbon black)       for marking purposes.      No other gross lesions were noted in the rest of the visualized duodenum       (D1/D2). Impression:               - No gross lesions in esophagus proximally. LA                            Grade B esophagitis.                           - Small hiatal hernia.                           - Gastritis.                           - A single duodenal polyp. Resected via piecemeal                            mucosal resection and retrieved. Clips (MR                            conditional) were placed.                           - No other gross lesions in the visualized examined                            duodenum. Recommendation:           - The patient will be observed post-procedure,                            until all discharge criteria are met.                           - Discharge patient to home.                           - Patient has a contact number available for                            emergencies. The signs and symptoms of potential  delayed complications were discussed with the                            patient. Return to normal activities tomorrow.                            Written discharge instructions were provided to the                            patient.                           - Full liquid diet today and if does well then                            transition tomorrow to soft diet for next 2-days                            and then advance diet thereafter.                           - Start Omeprazole 40 mg twice daily for next                            2-months. Then decrease to 40 mg daily thereafter                            with eventual need for likely maintain 20 mg daily.                            Maintain this until follow up with Dr. Pyrtle.                           - No aspirin, ibuprofen, naproxen, or other                             non-steroidal anti-inflammatory drugs for 2-3 weeks.                           - Continue present medications otherwise.                           - Await pathology results.                           - Normally, repeat EGD would be recommended in                            3-months to evaluate healing of the esophagitis.                            However, if patient is doing well, then repeat                              upper endoscopy in 6 months for surveillance of the                            duodenal adenoma is likely OK but defer to Dr.                            Pyrtle based on his management otherwise.                           - The findings and recommendations were discussed                            with the patient. Procedure Code(s):        --- Professional ---                           43254, Esophagogastroduodenoscopy, flexible,                            transoral; with endoscopic mucosal resection Diagnosis Code(s):        --- Professional ---                           K20.90, Esophagitis, unspecified without bleeding                           K44.9, Diaphragmatic hernia without obstruction or                            gangrene                           K29.70, Gastritis, unspecified, without bleeding                           K31.7, Polyp of stomach and duodenum CPT copyright 2019 American [employer]. All rights reserved. The codes documented in this report are preliminary and upon coder review may  be revised to meet current compliance requirements.  Mansouraty, MD 01/16/2020 1:51:43 PM Number of Addenda: 0 

## 2020-01-16 NOTE — H&P (Signed)
GASTROENTEROLOGY PROCEDURE H&P NOTE   Primary Care Physician: Janith Lima, MD  HPI: Kimberly Pierce is a 64 y.o. female who presents for EGD with EMR attempt of duodenal adenoma.  Past Medical History:  Diagnosis Date  . Adenomatous duodenal polyp   . Autoimmune hepatitis ([place])   . DVT (deep venous thrombosis) ([place])   . Elevated LFTs   . H/O seasonal allergies   . Hiatal hernia   . Hypertension   . Non-alcoholic cirrhosis ([place])    Past Surgical History:  Procedure Laterality Date  . ABDOMINAL HYSTERECTOMY     Current Facility-Administered Medications  Medication Dose Route Frequency Provider Last Rate Last Admin  . 0.9 %  sodium chloride infusion   Intravenous Continuous Mansouraty, Telford Nab., MD       Allergies  Allergen Reactions  . Percocet [Oxycodone-Acetaminophen] Rash    Rash with Percocet Rxed in ER 08/28/14    Family History  Problem Relation Age of Onset  . Heart attack Father   . Heart attack Mother   . Alcohol abuse Other   . Drug abuse Other   . Hypertension Other   . Thyroid disease Other   . Colon cancer Neg Hx    Social History   Socioeconomic History  . Marital status: Legally Separated    Spouse name: Not on file  . Number of children: 3  . Years of education: Not on file  . Highest education level: Not on file  Occupational History  . Not on file  Tobacco Use  . Smoking status: Current Some Day Smoker    Types: Cigarettes  . Smokeless tobacco: Never Used  Vaping Use  . Vaping Use: Never used  Substance and Sexual Activity  . Alcohol use: Yes    Comment: occaisional  . Drug use: No  . Sexual activity: Yes    Birth control/protection: Surgical  Other Topics Concern  . Not on file  Social History Narrative  . Not on file   Social Determinants of Health   Financial Resource Strain:   . Difficulty of Paying Living Expenses:   Food Insecurity:   . Worried About Charity fundraiser in the Last Year:   . Arboriculturist in  the Last Year:   Transportation Needs:   . Film/video editor (Medical):   Marland Kitchen Lack of Transportation (Non-Medical):   Physical Activity:   . Days of Exercise per Week:   . Minutes of Exercise per Session:   Stress:   . Feeling of Stress :   Social Connections:   . Frequency of Communication with Friends and Family:   . Frequency of Social Gatherings with Friends and Family:   . Attends Religious Services:   . Active Member of Clubs or Organizations:   . Attends Archivist Meetings:   Marland Kitchen Marital Status:   Intimate Partner Violence:   . Fear of Current or Ex-Partner:   . Emotionally Abused:   Marland Kitchen Physically Abused:   . Sexually Abused:     Physical Exam: Vital signs in last 24 hours: Temp:  [97.5 F (36.4 C)] 97.5 F (36.4 C) (08/09 1206) Pulse Rate:  [75] 75 (08/09 1206) Resp:  [15] 15 (08/09 1206) BP: (201)/(86) 201/86 (08/09 1206) SpO2:  [100 %] 100 % (08/09 1206) Weight:  [89.8 kg] 89.8 kg (08/09 1206)   GEN: NAD EYE: Sclerae anicteric ENT: MMM CV: Non-tachycardic GI: Soft, NT/ND NEURO:  Alert & Oriented x 3  Lab Results: No results for input(s): WBC, HGB, HCT, PLT in the last 72 hours. BMET No results for input(s): NA, K, CL, CO2, GLUCOSE, BUN, CREATININE, CALCIUM in the last 72 hours. LFT No results for input(s): PROT, ALBUMIN, AST, ALT, ALKPHOS, BILITOT, BILIDIR, IBILI in the last 72 hours. PT/INR No results for input(s): LABPROT, INR in the last 72 hours.   Impression / Plan: This is a 64 y.o.female who presents for EGD with EMR attempt of duodenal adenoma.  This is the first time that I met the patient today.  Based upon the description and endoscopic pictures I do feel that it is reasonable to pursue an Advanced Polypectomy attempt of the polyp/lesion.  We discussed some of the techniques of advanced polypectomy which include Endoscopic Mucosal Resection, OVESCO Full-Thickness Resection, Endorotor Morcellation, and Tissue Ablation via  Fulguration.  The risks and benefits of endoscopic evaluation were discussed with the patient; these include but are not limited to the risk of perforation, infection, bleeding, missed lesions, lack of diagnosis, severe illness requiring hospitalization, as well as anesthesia and sedation related illnesses.  During attempts at advanced resection, the risks of bleeding and perforation/leak are increased as opposed to diagnostic and screening procedures, and that was discussed with the patient as well.   In addition, I explained that with the possible need for piecemeal resection, subsequent short-interval endoscopic evaluation for follow up and potential retreatment of the lesion/area may be necessary. If, after attempt at removal of the polyp/lesion, it is found that the patient has a complication or that an invasive lesion or malignant lesion is found, or that the polyp/lesion continues to recur, the patient is aware and understands that surgery may still be indicated/required.  All patient questions were answered, to the best of my ability.  The risks and benefits of endoscopic evaluation were discussed with the patient; these include but are not limited to the risk of perforation, infection, bleeding, missed lesions, lack of diagnosis, severe illness requiring hospitalization, as well as anesthesia and sedation related illnesses.  The patient is agreeable to proceed.     Mansouraty, MD [facility] Advanced Endoscopy Office # [phone]  

## 2020-01-16 NOTE — Anesthesia Preprocedure Evaluation (Addendum)
Anesthesia Evaluation  Patient identified by MRN, date of birth, ID band Patient awake    Reviewed: Allergy & Precautions, NPO status , Patient's Chart, lab work & pertinent test results  History of Anesthesia Complications Negative for: history of anesthetic complications  Airway Mallampati: II  TM Distance: >3 FB Neck ROM: Full    Dental  (+) Poor Dentition, Dental Advisory Given,    Pulmonary neg recent URI, Current Smoker and Patient abstained from smoking.,    breath sounds clear to auscultation       Cardiovascular hypertension, Pt. on medications  Rhythm:Regular     Neuro/Psych  Neuromuscular disease negative psych ROS   GI/Hepatic hiatal hernia, (+) Hepatitis -duodenal adenoma   Endo/Other    Renal/GU negative Renal ROS     Musculoskeletal  (+) Arthritis ,   Abdominal   Peds  Hematology negative hematology ROS (+)   Anesthesia Other Findings   Reproductive/Obstetrics                            Anesthesia Physical Anesthesia Plan  ASA: III  Anesthesia Plan: MAC   Post-op Pain Management:    Induction: Intravenous  PONV Risk Score and Plan: 1 and Treatment may vary due to age or medical condition and Propofol infusion  Airway Management Planned: Nasal Cannula  Additional Equipment: None  Intra-op Plan:   Post-operative Plan:   Informed Consent: I have reviewed the patients History and Physical, chart, labs and discussed the procedure including the risks, benefits and alternatives for the proposed anesthesia with the patient or authorized representative who has indicated his/her understanding and acceptance.     Dental advisory given  Plan Discussed with: CRNA and Surgeon  Anesthesia Plan Comments:         Anesthesia Quick Evaluation

## 2020-01-17 ENCOUNTER — Encounter (HOSPITAL_COMMUNITY): Payer: Self-pay | Admitting: Gastroenterology

## 2020-01-17 LAB — SURGICAL PATHOLOGY

## 2020-01-21 ENCOUNTER — Encounter (HOSPITAL_COMMUNITY): Payer: Self-pay | Admitting: Gastroenterology

## 2020-01-21 NOTE — Anesthesia Postprocedure Evaluation (Signed)
Anesthesia Post Note  Patient: Mariajose L Lantis  Procedure(s) Performed: ESOPHAGOGASTRODUODENOSCOPY (EGD) WITH PROPOFOL (N/A ) ENDOSCOPIC MUCOSAL RESECTION (N/A ) POLYPECTOMY SUBMUCOSAL TATTOO INJECTION SUBMUCOSAL LIFTING INJECTION HEMOSTASIS CLIP PLACEMENT     Patient location during evaluation: Endoscopy Anesthesia Type: MAC Level of consciousness: awake and alert Pain management: pain level controlled Vital Signs Assessment: post-procedure vital signs reviewed and stable Respiratory status: spontaneous breathing, nonlabored ventilation, respiratory function stable and patient connected to nasal cannula oxygen Cardiovascular status: stable and blood pressure returned to baseline Postop Assessment: no apparent nausea or vomiting Anesthetic complications: no   No complications documented.  Last Vitals:  Vitals:   01/16/20 1400 01/16/20 1410  BP: (!) 170/126 (!) 174/75  Pulse: (!) 58 (!) 54  Resp: 18 14  Temp:    SpO2: 100% 99%    Last Pain:  Vitals:   01/16/20 1353  TempSrc: Axillary                 Aalayah Riles

## 2020-01-26 ENCOUNTER — Encounter: Payer: Self-pay | Admitting: Gastroenterology

## 2020-02-01 ENCOUNTER — Encounter: Payer: Self-pay | Admitting: Internal Medicine

## 2020-02-01 ENCOUNTER — Other Ambulatory Visit (INDEPENDENT_AMBULATORY_CARE_PROVIDER_SITE_OTHER): Payer: 59

## 2020-02-01 ENCOUNTER — Ambulatory Visit (INDEPENDENT_AMBULATORY_CARE_PROVIDER_SITE_OTHER): Payer: 59 | Admitting: Internal Medicine

## 2020-02-01 VITALS — BP 168/100 | HR 100 | Ht 66.0 in | Wt 200.8 lb

## 2020-02-01 DIAGNOSIS — K7460 Unspecified cirrhosis of liver: Secondary | ICD-10-CM

## 2020-02-01 DIAGNOSIS — K754 Autoimmune hepatitis: Secondary | ICD-10-CM

## 2020-02-01 DIAGNOSIS — D132 Benign neoplasm of duodenum: Secondary | ICD-10-CM

## 2020-02-01 LAB — COMPREHENSIVE METABOLIC PANEL
ALT: 38 U/L — ABNORMAL HIGH (ref 0–35)
AST: 37 U/L (ref 0–37)
Albumin: 4.9 g/dL (ref 3.5–5.2)
Alkaline Phosphatase: 91 U/L (ref 39–117)
BUN: 16 mg/dL (ref 6–23)
CO2: 26 mEq/L (ref 19–32)
Calcium: 10.6 mg/dL — ABNORMAL HIGH (ref 8.4–10.5)
Chloride: 100 mEq/L (ref 96–112)
Creatinine, Ser: 0.95 mg/dL (ref 0.40–1.20)
GFR: 71.42 mL/min (ref >60.00)
Glucose, Bld: 106 mg/dL — ABNORMAL HIGH (ref 70–99)
Potassium: 3.4 mEq/L — ABNORMAL LOW (ref 3.5–5.1)
Sodium: 136 mEq/L (ref 135–145)
Total Bilirubin: 0.7 mg/dL (ref 0.2–1.2)
Total Protein: 9 g/dL — ABNORMAL HIGH (ref 6.0–8.3)

## 2020-02-01 MED ORDER — BUDESONIDE 3 MG PO CPEP
6.0000 mg | ORAL_CAPSULE | Freq: Every day | ORAL | 2 refills | Status: DC
Start: 2020-02-01 — End: 2020-05-07

## 2020-02-01 MED FILL — BUDESONIDE 3 MG CAP: 3 | 30 days supply | Qty: 60 | Fill #0

## 2020-02-01 NOTE — Patient Instructions (Addendum)
Your provider has requested that you go to the basement level for lab work before leaving today. Press "B" on the elevator. The lab is located at the first door on the left as you exit the elevator.  We have sent the following medications to your pharmacy for you to pick up at your convenience: Budesonide 6 mg daily  Your provider has requested that you go to the basement level for lab work Friday, 03/02/20. Press "B" on the elevator. The lab is located at the first door on the left as you exit the elevator.  If you are age 64 or older, your body mass index should be between 23-30. Your Body mass index is 32.41 kg/m. If this is out of the aforementioned range listed, please consider follow up with your Primary Care Provider.  If you are age 65 or younger, your body mass index should be between 19-25. Your Body mass index is 32.41 kg/m. If this is out of the aformentioned range listed, please consider follow up with your Primary Care Provider.   Due to recent changes in healthcare laws, you may see the results of your imaging and laboratory studies on MyChart before your provider has had a chance to review them.  We understand that in some cases there may be results that are confusing or concerning to you. Not all laboratory results come back in the same time frame and the provider may be waiting for multiple results in order to interpret others.  Please give [place] 48 hours in order for your provider to thoroughly review all the results before contacting the office for clarification of your results.

## 2020-02-01 NOTE — Progress Notes (Signed)
   Subjective:    Patient ID: Kimberly Pierce, female    DOB: July 14, 1955, 64 y.o.   MRN: [id number]  HPI Kimberly Pierce is a 64 year old female with a history of cirrhosis most likely related to autoimmune hepatitis (though cannot exclude burnt out NASH), duodenal adenoma status post recent EMR hypertension, hyperlipidemia, remote DVT who is here for follow-up.  She is here alone today.  She came for an upper endoscopy which I performed on 11/14/2019 for variceal screening.  This revealed grade a reflux esophagitis, 2 cm hiatal hernia.  No varices.  Moderate inflammation in the gastric antrum and at the incisura.  Biopsies negative for H. pylori.  And a 10 mm sessile polyp in the second portion of the duodenum consistent with adenoma.  She then returned for EMR recently on 01/16/2020 with Dr. Rush Landmark.  He removed a duodenal adenoma placed prophylactic endoclips.  She reports she is feeling well.  She denies abdominal pain.  No itching.  No lower extremity or abdominal swelling.  No bleeding including blood in her stool or melena.  No confusion.  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  Review of Systems As per HPI, otherwise negative   Current Medications, Allergies, Past Medical History, Past Surgical History, Family History and Social History were reviewed in [employer] record.  Objective:   Physical Exam BP (!) 168/100   Pulse 100   Ht 5' 6"  (1.676 m)   Wt 200 lb 12.8 oz (91.1 kg)   BMI 32.41 kg/m  Gen: awake, alert, NAD HEENT: anicteric CV: RRR, no mrg Pulm: CTA b/l Abd: soft, NT/ND, +BS throughout Ext: no c/c/e Neuro: nonfocal  MRI abd 03/2019 --hepatic cirrhosis without neoplasm, ascites or acute findings    Assessment & Plan:  64 year old female with a history of cirrhosis most likely related to autoimmune hepatitis (though cannot exclude burnt out NASH), duodenal adenoma status post recent EMR hypertension, hyperlipidemia, remote DVT  who is here for follow-up.  1.  Cirrhosis due to autoimmune hepatitis and probable overlapping fatty liver disease --she has had mild elevation in her AST and ALT but her disease remains well compensated.  She had liver biopsy in December 2020 supporting diagnosis of autoimmune hepatitis in the setting of elevated IgG and positive ANA.  Her variceal screening was negative. --We discussed autoimmune hepatitis today, repeat liver enzymes and IgG --Trial of budesonide therapy 6 mg for 2 to 3 months to see if this improves liver inflammation.  If so would consider adding azathioprine.  Her TPMT is normal --Variceal screening negative June 2021, repeat in 2 years --[facility] screening negative by MRI October 2020; performed abdominal/liver ultrasound for [facility] screening in October 2020 --Check CMP today prior to starting budesonide therapy and repeat in 1 month --Check IgG today --No evidence for hepatic encephalopathy or ascites/lower extremity edema   2.  Duodenal adenoma --status post endoscopic mucosal resection recently with Dr. Rush Landmark, he will repeat surveillance upper endoscopy in 6 months.  I appreciate his help with removing this lesion  3.  CRC screening --negative Cologuard in 2019, repeat in 2022  Follow-up with me in 3 to 6 months  30 minutes total spent today including patient facing time, coordination of care, reviewing medical history/procedures/pertinent radiology studies, and documentation of the encounter.

## 2020-02-02 LAB — IGG: IgG (Immunoglobin G), Serum: 1587 mg/dL — ABNORMAL HIGH (ref 600–1540)

## 2020-02-15 ENCOUNTER — Other Ambulatory Visit: Payer: Self-pay | Admitting: Internal Medicine

## 2020-02-15 ENCOUNTER — Other Ambulatory Visit: Payer: Self-pay | Admitting: Family Medicine

## 2020-02-15 DIAGNOSIS — E559 Vitamin D deficiency, unspecified: Secondary | ICD-10-CM

## 2020-02-27 ENCOUNTER — Other Ambulatory Visit: Payer: Self-pay | Admitting: Internal Medicine

## 2020-02-27 ENCOUNTER — Telehealth: Payer: Self-pay | Admitting: *Deleted

## 2020-02-27 DIAGNOSIS — K7460 Unspecified cirrhosis of liver: Secondary | ICD-10-CM

## 2020-02-27 NOTE — Telephone Encounter (Signed)
-----   Message from Larina Bras, [place] sent at 10/31/2019  2:18 PM EDT ----- Pt needs abd u/s around 03/2020 for [facility] screen dx cirrhosis. See 10/31/19 office note

## 2020-02-27 NOTE — Telephone Encounter (Signed)
Patient has been scheduled for abdominal ultrasound at [facility]. She is scheduled for Monday, 03/05/20 at 9:30 am, 9:15 am arrival. She should be NPO 6 hours prior to her appointment. I have left a message for her to call back so I can go over this information with her.

## 2020-02-27 NOTE — Telephone Encounter (Signed)
Pt is aware of appt details

## 2020-02-28 ENCOUNTER — Telehealth: Payer: Self-pay | Admitting: Family Medicine

## 2020-02-28 ENCOUNTER — Other Ambulatory Visit: Payer: Self-pay | Admitting: Family Medicine

## 2020-02-28 NOTE — Telephone Encounter (Signed)
Patient called requesting a refill on traMADol (ULTRAM) 50 MG tablet and gabapentin (NEURONTIN) 100 MG capsule. She said that she did not think the Gabapetin was helping but now she thinks it was and would like to start back on it.   Please advise.

## 2020-02-28 NOTE — Telephone Encounter (Signed)
Spoke with patient today. She verbalizes understanding of appointment time, date, location and that she should be NPO 6 hours prior to test.

## 2020-02-29 ENCOUNTER — Other Ambulatory Visit: Payer: Self-pay | Admitting: Internal Medicine

## 2020-02-29 MED ORDER — TRAMADOL HCL 50 MG PO TABS: ORAL_TABLET | ORAL | 0 refills | Status: DC

## 2020-02-29 MED ORDER — GABAPENTIN 100 MG PO CAPS: 100.0000 mg | ORAL_CAPSULE | Freq: Three times a day (TID) | ORAL | 3 refills | Status: AC | PRN

## 2020-02-29 MED FILL — GABAPENTIN 100 MG CAPSULE: 100 | 10 days supply | Qty: 90 | Fill #0

## 2020-02-29 MED FILL — traMADol HCL 50 MG TABS: 50 | 5 days supply | Qty: 15 | Fill #0

## 2020-02-29 NOTE — Telephone Encounter (Signed)
Gabapentin refilled.  This should replace the Lyrica.

## 2020-02-29 NOTE — Telephone Encounter (Signed)
Tramadol refilled. My review of notes you most recently were taking Lyrica not gabapentin. Please confirm that you would like to restart gabapentin and not Lyrica.

## 2020-02-29 NOTE — Telephone Encounter (Signed)
Called pt and confirmed that she wants Gabapentin refilled not Lyrica and she wants refill to go to [facility].

## 2020-03-02 ENCOUNTER — Other Ambulatory Visit (INDEPENDENT_AMBULATORY_CARE_PROVIDER_SITE_OTHER): Payer: 59

## 2020-03-02 DIAGNOSIS — K7460 Unspecified cirrhosis of liver: Secondary | ICD-10-CM | POA: Diagnosis not present

## 2020-03-02 DIAGNOSIS — K754 Autoimmune hepatitis: Secondary | ICD-10-CM | POA: Diagnosis not present

## 2020-03-02 LAB — COMPREHENSIVE METABOLIC PANEL
ALT: 37 U/L — ABNORMAL HIGH (ref 0–35)
AST: 33 U/L (ref 0–37)
Albumin: 4.7 g/dL (ref 3.5–5.2)
Alkaline Phosphatase: 107 U/L (ref 39–117)
BUN: 16 mg/dL (ref 6–23)
CO2: 24 mEq/L (ref 19–32)
Calcium: 9.7 mg/dL (ref 8.4–10.5)
Chloride: 103 mEq/L (ref 96–112)
Creatinine, Ser: 0.53 mg/dL (ref 0.40–1.20)
GFR: 140.03 mL/min (ref >60.00)
Glucose, Bld: 118 mg/dL — ABNORMAL HIGH (ref 70–99)
Potassium: 4.1 mEq/L (ref 3.5–5.1)
Sodium: 137 mEq/L (ref 135–145)
Total Bilirubin: 0.8 mg/dL (ref 0.2–1.2)
Total Protein: 8.3 g/dL (ref 6.0–8.3)

## 2020-03-02 MED FILL — BUDESONIDE 3 MG CAP: 3 | 30 days supply | Qty: 60 | Fill #1

## 2020-03-03 ENCOUNTER — Emergency Department (HOSPITAL_COMMUNITY): Payer: 59

## 2020-03-03 ENCOUNTER — Inpatient Hospital Stay (HOSPITAL_COMMUNITY)
Admission: EM | Admit: 2020-03-03 | Discharge: 2020-03-06 | DRG: 536 | Disposition: A | Discharge status: Home Health | Payer: 59 | Attending: Internal Medicine | Admitting: Internal Medicine

## 2020-03-03 ENCOUNTER — Encounter (HOSPITAL_COMMUNITY): Payer: Self-pay

## 2020-03-03 ENCOUNTER — Other Ambulatory Visit: Payer: Self-pay

## 2020-03-03 DIAGNOSIS — Z8249 Family history of ischemic heart disease and other diseases of the circulatory system: Secondary | ICD-10-CM | POA: Diagnosis not present

## 2020-03-03 DIAGNOSIS — Z20822 Contact with and (suspected) exposure to covid-19: Secondary | ICD-10-CM | POA: Diagnosis present

## 2020-03-03 DIAGNOSIS — R52 Pain, unspecified: Secondary | ICD-10-CM | POA: Diagnosis not present

## 2020-03-03 DIAGNOSIS — K7581 Nonalcoholic steatohepatitis (NASH): Secondary | ICD-10-CM | POA: Diagnosis present

## 2020-03-03 DIAGNOSIS — M25552 Pain in left hip: Secondary | ICD-10-CM | POA: Diagnosis not present

## 2020-03-03 DIAGNOSIS — W010XXA Fall on same level from slipping, tripping and stumbling without subsequent striking against object, initial encounter: Secondary | ICD-10-CM | POA: Diagnosis present

## 2020-03-03 DIAGNOSIS — S329XXA Fracture of unspecified parts of lumbosacral spine and pelvis, initial encounter for closed fracture: Secondary | ICD-10-CM | POA: Diagnosis present

## 2020-03-03 DIAGNOSIS — E669 Obesity, unspecified: Secondary | ICD-10-CM | POA: Diagnosis present

## 2020-03-03 DIAGNOSIS — Z885 Allergy status to narcotic agent status: Secondary | ICD-10-CM | POA: Diagnosis not present

## 2020-03-03 DIAGNOSIS — I10 Essential (primary) hypertension: Secondary | ICD-10-CM | POA: Diagnosis not present

## 2020-03-03 DIAGNOSIS — E785 Hyperlipidemia, unspecified: Secondary | ICD-10-CM | POA: Diagnosis present

## 2020-03-03 DIAGNOSIS — Z7982 Long term (current) use of aspirin: Secondary | ICD-10-CM

## 2020-03-03 DIAGNOSIS — M47816 Spondylosis without myelopathy or radiculopathy, lumbar region: Secondary | ICD-10-CM | POA: Diagnosis present

## 2020-03-03 DIAGNOSIS — K7460 Unspecified cirrhosis of liver: Secondary | ICD-10-CM | POA: Diagnosis present

## 2020-03-03 DIAGNOSIS — S32301A Unspecified fracture of right ilium, initial encounter for closed fracture: Secondary | ICD-10-CM | POA: Diagnosis not present

## 2020-03-03 DIAGNOSIS — F17210 Nicotine dependence, cigarettes, uncomplicated: Secondary | ICD-10-CM | POA: Diagnosis present

## 2020-03-03 DIAGNOSIS — M810 Age-related osteoporosis without current pathological fracture: Secondary | ICD-10-CM | POA: Diagnosis present

## 2020-03-03 DIAGNOSIS — E559 Vitamin D deficiency, unspecified: Secondary | ICD-10-CM | POA: Diagnosis present

## 2020-03-03 DIAGNOSIS — Y92013 Bedroom of single-family (private) house as the place of occurrence of the external cause: Secondary | ICD-10-CM | POA: Diagnosis not present

## 2020-03-03 DIAGNOSIS — I7090 Unspecified atherosclerosis: Secondary | ICD-10-CM | POA: Diagnosis not present

## 2020-03-03 DIAGNOSIS — S32502A Unspecified fracture of left pubis, initial encounter for closed fracture: Secondary | ICD-10-CM | POA: Diagnosis not present

## 2020-03-03 DIAGNOSIS — T68XXXA Hypothermia, initial encounter: Secondary | ICD-10-CM | POA: Diagnosis not present

## 2020-03-03 DIAGNOSIS — K754 Autoimmune hepatitis: Secondary | ICD-10-CM | POA: Diagnosis present

## 2020-03-03 DIAGNOSIS — Z6832 Body mass index (BMI) 32.0-32.9, adult: Secondary | ICD-10-CM | POA: Diagnosis not present

## 2020-03-03 DIAGNOSIS — M1612 Unilateral primary osteoarthritis, left hip: Secondary | ICD-10-CM | POA: Diagnosis not present

## 2020-03-03 DIAGNOSIS — M160 Bilateral primary osteoarthritis of hip: Secondary | ICD-10-CM | POA: Diagnosis not present

## 2020-03-03 DIAGNOSIS — F17200 Nicotine dependence, unspecified, uncomplicated: Secondary | ICD-10-CM | POA: Diagnosis present

## 2020-03-03 DIAGNOSIS — Z79899 Other long term (current) drug therapy: Secondary | ICD-10-CM | POA: Diagnosis not present

## 2020-03-03 DIAGNOSIS — S32512A Fracture of superior rim of left pubis, initial encounter for closed fracture: Secondary | ICD-10-CM | POA: Diagnosis not present

## 2020-03-03 DIAGNOSIS — W19XXXA Unspecified fall, initial encounter: Secondary | ICD-10-CM

## 2020-03-03 DIAGNOSIS — S32810A Multiple fractures of pelvis with stable disruption of pelvic ring, initial encounter for closed fracture: Secondary | ICD-10-CM

## 2020-03-03 DIAGNOSIS — Z23 Encounter for immunization: Secondary | ICD-10-CM

## 2020-03-03 DIAGNOSIS — Z86718 Personal history of other venous thrombosis and embolism: Secondary | ICD-10-CM | POA: Diagnosis not present

## 2020-03-03 DIAGNOSIS — S32592A Other specified fracture of left pubis, initial encounter for closed fracture: Principal | ICD-10-CM | POA: Diagnosis present

## 2020-03-03 LAB — RESPIRATORY PANEL BY RT PCR (FLU A&B, COVID)
Influenza A by PCR: NEGATIVE
Influenza B by PCR: NEGATIVE
SARS Coronavirus 2 by RT PCR: NEGATIVE

## 2020-03-03 LAB — BASIC METABOLIC PANEL
Anion gap: 12 (ref 5–15)
BUN: 10 mg/dL (ref 8–23)
CO2: 22 mmol/L (ref 22–32)
Calcium: 9.6 mg/dL (ref 8.9–10.3)
Chloride: 100 mmol/L (ref 98–111)
Creatinine, Ser: 0.48 mg/dL (ref 0.44–1.00)
GFR calc Af Amer: >60 mL/min (ref >60)
GFR calc non Af Amer: >60 mL/min (ref >60)
Glucose, Bld: 114 mg/dL — ABNORMAL HIGH (ref 70–99)
Potassium: 3.6 mmol/L (ref 3.5–5.1)
Sodium: 134 mmol/L — ABNORMAL LOW (ref 135–145)

## 2020-03-03 LAB — CBC
HCT: 38 % (ref 36.0–46.0)
Hemoglobin: 12.6 g/dL (ref 12.0–15.0)
MCH: 32.5 pg (ref 26.0–34.0)
MCHC: 33.2 g/dL (ref 30.0–36.0)
MCV: 97.9 fL (ref 80.0–100.0)
Platelets: 174 10*3/uL (ref 150–400)
RBC: 3.88 MIL/uL (ref 3.87–5.11)
RDW: 12.3 % (ref 11.5–15.5)
WBC: 6.3 10*3/uL (ref 4.0–10.5)
nRBC: 0 % (ref 0.0–0.2)

## 2020-03-03 MED ORDER — LORATADINE 10 MG PO TABS: 10.0000 mg | ORAL_TABLET | Freq: Every day | ORAL | Status: DC | PRN

## 2020-03-03 MED ORDER — ENOXAPARIN SODIUM 40 MG/0.4ML ~~LOC~~ SOLN
40.0000 mg | SUBCUTANEOUS | Status: DC
  Administered 2020-03-04 – 2020-03-05 (×2): 40 mg via SUBCUTANEOUS
  Filled 2020-03-03 (×2): qty 0.4

## 2020-03-03 MED ORDER — IBUPROFEN 400 MG PO TABS
400.0000 mg | ORAL_TABLET | Freq: Four times a day (QID) | ORAL | Status: DC | PRN
  Administered 2020-03-03: 400 mg via ORAL
  Filled 2020-03-03: qty 1

## 2020-03-03 MED ORDER — HYDROMORPHONE HCL 1 MG/ML IJ SOLN
0.5000 mg | INTRAMUSCULAR | Status: DC | PRN
  Administered 2020-03-04: 0.5 mg via INTRAVENOUS
  Filled 2020-03-03: qty 1

## 2020-03-03 MED ORDER — CHLORTHALIDONE 25 MG PO TABS
25.0000 mg | ORAL_TABLET | Freq: Every day | ORAL | Status: DC
  Administered 2020-03-04 – 2020-03-06 (×3): 25 mg via ORAL
  Filled 2020-03-03 (×5): qty 1

## 2020-03-03 MED ORDER — ONDANSETRON HCL 4 MG PO TABS: 4.0000 mg | ORAL_TABLET | Freq: Four times a day (QID) | ORAL | Status: DC | PRN

## 2020-03-03 MED ORDER — ASPIRIN EC 81 MG PO TBEC
81.0000 mg | DELAYED_RELEASE_TABLET | Freq: Every day | ORAL | Status: DC
  Administered 2020-03-04 – 2020-03-06 (×3): 81 mg via ORAL
  Filled 2020-03-03 (×3): qty 1

## 2020-03-03 MED ORDER — LIDOCAINE 5 % EX PTCH
1.0000 | MEDICATED_PATCH | CUTANEOUS | Status: DC
  Administered 2020-03-03 – 2020-03-06 (×4): 1 via TRANSDERMAL
  Filled 2020-03-03 (×4): qty 1

## 2020-03-03 MED ORDER — VITAMIN D 25 MCG (1000 UNIT) PO TABS
1000.0000 [IU] | ORAL_TABLET | Freq: Every day | ORAL | Status: DC
  Administered 2020-03-04 – 2020-03-06 (×3): 1000 [IU] via ORAL
  Filled 2020-03-03 (×3): qty 1

## 2020-03-03 MED ORDER — TRAMADOL HCL 50 MG PO TABS
50.0000 mg | ORAL_TABLET | Freq: Once | ORAL | Status: AC
  Administered 2020-03-03: 50 mg via ORAL
  Filled 2020-03-03: qty 1

## 2020-03-03 MED ORDER — ONDANSETRON HCL 4 MG/2ML IJ SOLN: 4.0000 mg | Freq: Four times a day (QID) | INTRAMUSCULAR | Status: DC | PRN

## 2020-03-03 MED ORDER — BUDESONIDE 3 MG PO CPEP
6.0000 mg | ORAL_CAPSULE | Freq: Every day | ORAL | Status: DC
  Administered 2020-03-04 – 2020-03-06 (×3): 6 mg via ORAL
  Filled 2020-03-03 (×5): qty 2

## 2020-03-03 MED ORDER — NAPHAZOLINE-GLYCERIN 0.012-0.2 % OP SOLN
1.0000 [drp] | Freq: Four times a day (QID) | OPHTHALMIC | Status: DC | PRN
  Filled 2020-03-03: qty 15

## 2020-03-03 MED ORDER — GABAPENTIN 100 MG PO CAPS
100.0000 mg | ORAL_CAPSULE | Freq: Three times a day (TID) | ORAL | Status: DC | PRN
  Administered 2020-03-03 – 2020-03-04 (×2): 100 mg via ORAL
  Filled 2020-03-03 (×2): qty 1

## 2020-03-03 NOTE — ED Triage Notes (Signed)
Pt BIB [facility], reports at 0200 she slipped and fell in the bathroom floor, states the floor was "slick," c/o Left hip pain. Pt reports when she got up to use the bathroom again this morning she was unable to walk d/t hip pain. Her husband assisted her to the floor, they called 911 for further evaluation.  Pt has not taken her HTN meds  Pt a/o x4  BP 172/108 HR 70 98% RA  CBG 139

## 2020-03-03 NOTE — ED Provider Notes (Signed)
[place] [facility] EMERGENCY DEPARTMENT Provider Note   CSN: [id number] Arrival date & time: 03/03/20  8889     History Chief Complaint  Patient presents with  . [place] is a 64 y.o. female presenting for evaluation after fall.  Patient states last night around 2 AM she got up to go the bathroom when she slipped, falling on her left side.  She not hit her head or lose consciousness.  She was unable to stand back up and get back into bed, EMS was called to help her back into bed.  Patient states when she woke up this morning, she was once again unable to get out of bed due to left hip pain.  As such, EMS brought her to the hospital.  She denies pain anywhere other than her left hip/buttock.  She denies headache, neck pain, back pain, loss of bowel bladder control, numbness, or tingling.  She has not taken anything for pain.  He is not on blood thinners.  Pain is constant, worse with palpation and movement.  Nothing makes it better.  It does not radiate.  HPI     Past Medical History:  Diagnosis Date  . Adenomatous duodenal polyp   . Autoimmune hepatitis ([place])   . DVT (deep venous thrombosis) ([place])   . Elevated LFTs   . H/O seasonal allergies   . Hiatal hernia   . Hypertension   . Non-alcoholic cirrhosis [facility])     Patient Active Problem List   Diagnosis Date Noted  . Liver cirrhosis secondary to NASH ([place]) 04/06/2019  . Cervical cancer screening 05/04/2017  . Elevated ferritin 05/08/2016  . Routine general medical examination at a health care facility 05/07/2016  . Cervical radiculitis 10/04/2014  . Degeneration of meniscus of right knee 09/20/2014  . Systolic murmur 16/94/5038  . Primary osteoarthritis of both knees 09/11/2014  . Vitamin D deficiency 03/24/2013  . Hyperlipidemia with target LDL less than 130 03/23/2013  . Hyperglycemia 03/23/2013  . Fracture of lumbar vertebra, compression ([place]) 05/05/2011  . Osteoporosis 05/05/2011  . TOBACCO  USE 07/31/2010  . Essential hypertension 07/31/2010    Past Surgical History:  Procedure Laterality Date  . ABDOMINAL HYSTERECTOMY    . ENDOSCOPIC MUCOSAL RESECTION N/A 01/16/2020   Procedure: ENDOSCOPIC MUCOSAL RESECTION;  Surgeon: Rush Landmark Telford Nab., MD;  Location: [place];  Service: Gastroenterology;  Laterality: N/A;  . ESOPHAGOGASTRODUODENOSCOPY (EGD) WITH PROPOFOL N/A 01/16/2020   Procedure: ESOPHAGOGASTRODUODENOSCOPY (EGD) WITH PROPOFOL;  Surgeon: Rush Landmark Telford Nab., MD;  Location: [place];  Service: Gastroenterology;  Laterality: N/A;  . HEMOSTASIS CLIP PLACEMENT  01/16/2020   Procedure: HEMOSTASIS CLIP PLACEMENT;  Surgeon: Irving Copas., MD;  Location: [place];  Service: Gastroenterology;;  . POLYPECTOMY  01/16/2020   Procedure: POLYPECTOMY;  Surgeon: Irving Copas., MD;  Location: [place];  Service: Gastroenterology;;  . Lia Foyer LIFTING INJECTION  01/16/2020   Procedure: SUBMUCOSAL LIFTING INJECTION;  Surgeon: Irving Copas., MD;  Location: [place];  Service: Gastroenterology;;  . Lia Foyer TATTOO INJECTION  01/16/2020   Procedure: SUBMUCOSAL TATTOO INJECTION;  Surgeon: Irving Copas., MD;  Location: [place];  Service: Gastroenterology;;     OB History   No obstetric history on file.     Family History  Problem Relation Age of Onset  . Heart attack Father   . Heart attack Mother   . Alcohol abuse Other   . Drug abuse Other   . Hypertension Other   .  Thyroid disease Other   . Colon cancer Neg Hx     Social History   Tobacco Use  . Smoking status: Current Some Day Smoker    Types: Cigarettes  . Smokeless tobacco: Never Used  Vaping Use  . Vaping Use: Never used  Substance Use Topics  . Alcohol use: Yes    Comment: occaisional  . Drug use: No    Home Medications Prior to Admission medications   Medication Sig Start Date End Date Taking? Authorizing Provider  aspirin EC 81 MG tablet Take  81 mg by mouth daily.   Yes [provider]  budesonide (ENTOCORT EC) 3 MG 24 hr capsule Take 2 capsules (6 mg total) by mouth daily. 02/01/20  Yes Pyrtle, Lajuan Lines, MD  chlorthalidone (HYGROTON) 25 MG tablet Take 1 tablet (25 mg total) by mouth daily. 02/16/19  Yes Janith Lima, MD  Cholecalciferol 25 MCG (1000 UT) tablet Take 1,000 Units by mouth daily.   Yes [provider]  gabapentin (NEURONTIN) 100 MG capsule Take 1-3 capsules (100-300 mg total) by mouth 3 (three) times daily as needed (nerve pain). 02/29/20  Yes Gregor Hams, MD  ibuprofen (ADVIL) 200 MG tablet Take 200-800 mg by mouth every 6 (six) hours as needed for moderate pain.    Yes [provider]  loratadine (CLARITIN) 10 MG tablet Take 10 mg by mouth daily as needed for allergies.    Yes [provider]  Omega-3 Fatty Acids (FISH OIL PO) Take 1 capsule by mouth daily.   Yes [provider]  PFIZER-BIONTECH COVID-19 VACC 30 MCG/0.3ML injection Inject 0.3 mLs into the muscle once. 12/28/19  Yes [provider]  tetrahydrozoline 0.05 % ophthalmic solution Place 1 drop into both eyes 4 (four) times daily as needed (dry eyes, itchy eyes).    Yes [provider]  traMADol (ULTRAM) 50 MG tablet TAKE 1 TABLET BY MOUTH EVERY 8 HOURS AS NEEDED FOR SEVERE PAIN Patient taking differently: Take 50 mg by mouth every 8 (eight) hours as needed for severe pain.  02/29/20  Yes Gregor Hams, MD  TURMERIC PO Take 1 capsule by mouth daily.   Yes [provider]  nebivolol (BYSTOLIC) 5 MG tablet Take 1 tablet (5 mg total) by mouth daily. Patient not taking: Reported on 03/03/2020 02/16/19   Janith Lima, MD  omeprazole (PRILOSEC) 40 MG capsule Take 1 capsule (40 mg total) by mouth 2 (two) times daily before a meal. Patient not taking: Reported on 03/03/2020 01/16/20 01/15/21  Mansouraty, Telford Nab., MD  potassium chloride (K-DUR) 10 MEQ tablet Take 1 tablet (10 mEq total) by mouth  daily. Patient not taking: Reported on 10/31/2019 02/16/19   Janith Lima, MD  rosuvastatin (CRESTOR) 5 MG tablet Take 1 tablet (5 mg total) by mouth daily. Patient not taking: Reported on 03/03/2020 02/16/19   Janith Lima, MD    Allergies    Percocet [oxycodone-acetaminophen]  Review of Systems   Review of Systems  Musculoskeletal: Positive for arthralgias.  All other systems reviewed and are negative.   Physical Exam Updated Vital Signs BP (!) 158/76   Pulse 63   Temp 98.4 F (36.9 C) (Oral)   Resp 18   Ht 5' 6"  (1.676 m)   Wt 91.2 kg   SpO2 97%   BMI 32.44 kg/m   Physical Exam Vitals and nursing note reviewed.  Constitutional:      General: She is not in acute distress.  Appearance: She is well-developed.     Comments: Resting in the bed in no acute distress  HENT:     Head: Normocephalic and atraumatic.  Eyes:     Conjunctiva/sclera: Conjunctivae normal.     Pupils: Pupils are equal, round, and reactive to light.  Neck:     Comments: No TTP of midline C-spine.  No step-offs or deformities. Cardiovascular:     Rate and Rhythm: Normal rate and regular rhythm.     Pulses: Normal pulses.  Pulmonary:     Effort: Pulmonary effort is normal. No respiratory distress.     Breath sounds: Normal breath sounds. No wheezing.  Abdominal:     General: There is no distension.     Palpations: Abdomen is soft. There is no mass.     Tenderness: There is no abdominal tenderness. There is no guarding or rebound.  Musculoskeletal:        General: Normal range of motion.     Cervical back: Normal range of motion and neck supple.       Back:     Comments: Tenderness palpation of left low back/left upper buttock.  No pain of the lateral or anterior hip.  Left leg is not shortened or externally rotated.  Pedal pulses 2+ bilaterally.  No saddle paresthesia. No tenderness palpation midline spine.  No step-offs or deformities.  No tenderness palpation of the right low back.   Skin:    General: Skin is warm and dry.     Capillary Refill: Capillary refill takes less than 2 seconds.  Neurological:     Mental Status: She is alert and oriented to person, place, and time.     ED Results / Procedures / Treatments   Labs (all labs ordered are listed, but only abnormal results are displayed) Labs Reviewed  BASIC METABOLIC PANEL - Abnormal; Notable for the following components:      Result Value   Sodium 134 (*)    Glucose, Bld 114 (*)    All other components within normal limits  RESPIRATORY PANEL BY RT PCR (FLU A&B, COVID)  CBC    EKG None  Radiology DG Hip Unilat With Pelvis 2-3 Views Left  Result Date: 03/03/2020 CLINICAL DATA:  Pain.  Fall. EXAM: DG HIP (WITH OR WITHOUT PELVIS) 2-3V LEFT COMPARISON:  02/27/2021w FINDINGS: Acute mildly displaced fractures of left superior and inferior pubic rami. No evidence of hip fracture. Hips are located. Similar severe right hip degenerative change with severe joint space narrowing, osteophytes, and juxta-articular sclerosis. Mild to moderate left hip degenerative change. Osteopenia. Atherosclerotic calcifications. IMPRESSION: 1. Acute mildly displaced fractures of left superior and inferior pubic rami. 2. Similar severe right and mild-to-moderate left hip degenerative change. Electronically Signed   By: Margaretha Sheffield MD   On: 03/03/2020 09:02    Procedures Procedures (including critical care time)  Medications Ordered in ED Medications  lidocaine (LIDODERM) 5 % 1 patch (1 patch Transdermal Patch Applied 03/03/20 0902)  traMADol (ULTRAM) tablet 50 mg (50 mg Oral Given 03/03/20 0901)    ED Course  I have reviewed the triage vital signs and the nursing notes.  Pertinent labs & imaging results that were available during my care of the patient were reviewed by me and considered in my medical decision making (see chart for details).    MDM Rules/Calculators/A&P                          Patient  presented for  evaluation after a fall.  On exam, patient is nontoxic.  She is neurovascularly intact.  She fell onto her left side, this is where she is having pain.  Pain is most reproducible over the musculature and buttock of the left low back, not over the bony protuberance.  Leg is not shortened or externally rotated, low suspicion for hip fracture.  However as patient cannot walk, will obtain x-rays.  Will treat symptomatically with a dose of her home tramadol and a Lidoderm patch while x-rays are pending. If negative, will need to ensure pt can walk.   X-ray viewed interpreted by me, shows inferior and superior pelvic rami fracture which is mildly displaced.  Will have PT evaluate patient.  PT evaluated the patient, states patient was only able to walk 5 feet with severe pain.  Recommend admission to the hospital for pain control and further PT eval.  Will consult with Ortho and obtain basic labs.  Discussed with Dr. Marcelino Scot from orthopedics who is agreeable to admission to medicine. recommends OP f/u.  Labs interpreted by me, overall reassuring. Will call for admission.  Discussed with Dr. Roosevelt Locks from [facility] hospitalist service, patient to be admitted.  Final Clinical Impression(s) / ED Diagnoses Final diagnoses:  Closed displaced fracture of left pubis, initial encounter [facility])  Fall, initial encounter    Rx / DC Orders ED Discharge Orders    None       Franchot Heidelberg, PA-C 03/03/20 1339    Quintella Reichert, MD 03/03/20 [phone]

## 2020-03-03 NOTE — Evaluation (Signed)
Physical Therapy Evaluation Patient Details Name: Kimberly Pierce MRN: [id number] DOB: 12/03/1955 Today's Date: 03/03/2020   History of Present Illness  Pt is a 64 y.o. F with significant PMH of osteoporosis, liver cirrhosis, tobacco use, compression fracture of lumbar vertebrae who presents after a fall with acute mildly displaced fractures of left superior and inferior pubic rami.   Clinical Impression  Prior to admission, pt lives with a friend and is an Geologist, engineering for [employer]. Pt presents with a significant change from her baseline secondary to L hip pain, LLE weakness, gait abnormalities and balance deficits. Pt with 9/10 pain with left hip flexion and attempts at weightbearing. Requiring moderate assist for bed mobility; ambulating 5 feet with a walker and significantly altered/antalgic gait pattern with decreased reliance through left lower extremity. Would benefit from further acute PT services in addition to pain control/management to maximize functional mobility prior to d/c home.     Follow Up Recommendations Home health PT;Supervision for mobility/OOB    Equipment Recommendations  Rolling walker with 5" wheels;3in1 (PT)    Recommendations for Other Services       Precautions / Restrictions Precautions Precautions: Fall Restrictions Weight Bearing Restrictions: No      Mobility  Bed Mobility Overal bed mobility: Needs Assistance Bed Mobility: Supine to Sit;Sit to Supine     Supine to sit: Mod assist Sit to supine: Mod assist   General bed mobility comments: ModA for trunk assist to upright, assist for BLE negotiation back into bed  Transfers Overall transfer level: Needs assistance Equipment used: Rolling walker (2 wheeled) Transfers: Sit to/from Stand Sit to Stand: Min assist         General transfer comment: MinA to rise to stand from edge of bed  Ambulation/Gait Ambulation/Gait assistance: Min assist Gait Distance (Feet): 5 Feet Assistive device:  Rolling walker (2 wheeled) Gait Pattern/deviations: Step-to pattern;Decreased stance time - left;Decreased weight shift to left;Antalgic;Trunk flexed;Decreased step length - right Gait velocity: decreased Gait velocity interpretation: <1.31 ft/sec, indicative of household ambulator General Gait Details: Max cues for walker use, sequencing, left knee extension. Pt with significantly antalgic gait pattern, increased trunk flexion, increased left knee flexion in midstance, decreased R step length  Stairs            Wheelchair Mobility    Modified Rankin (Stroke Patients Only)       Balance Overall balance assessment: Needs assistance Sitting-balance support: Feet supported Sitting balance-Leahy Scale: Fair     Standing balance support: Bilateral upper extremity supported Standing balance-Leahy Scale: Poor                               Pertinent Vitals/Pain Pain Assessment: 0-10 Pain Score: 9  Pain Location: L posterior hip with hip flexion and weightbearing Pain Descriptors / Indicators: Aching;Grimacing;Guarding Pain Intervention(s): Limited activity within patient's tolerance;Monitored during session    Home Living Family/patient expects to be discharged to:: Private residence Living Arrangements: Non-relatives/Friends Available Help at Discharge: Friend(s) Type of Home: Apartment Home Access: Level entry     Home Layout: One level Home Equipment: None      Prior Function Level of Independence: Independent         Comments: Housekeeping at [employer]        Extremity/Trunk Assessment   Upper Extremity Assessment Upper Extremity Assessment: Overall WFL for tasks assessed    Lower Extremity Assessment Lower  Extremity Assessment: LLE deficits/detail LLE Deficits / Details: Hip flexion 2/5, knee extension 5/5, ankle dorsiflexion 5/5    Cervical / Trunk Assessment Cervical / Trunk Assessment: Normal  Communication    Communication: No difficulties  Cognition Arousal/Alertness: Awake/alert Behavior During Therapy: WFL for tasks assessed/performed Overall Cognitive Status: Within Functional Limits for tasks assessed                                        General Comments      Exercises     Assessment/Plan    PT Assessment Patient needs continued PT services  PT Problem List Decreased strength;Decreased activity tolerance;Decreased mobility;Decreased balance;Pain       PT Treatment Interventions DME instruction;Gait training;Therapeutic activities;Functional mobility training;Therapeutic exercise;Balance training;Patient/family education    PT Goals (Current goals can be found in the Care Plan section)  Acute Rehab PT Goals Patient Stated Goal: return to work PT Goal Formulation: With patient Time For Goal Achievement: 03/17/20 Potential to Achieve Goals: Good    Frequency Min 5X/week   Barriers to discharge        Co-evaluation               AM-PAC PT "6 Clicks" Mobility  Outcome Measure Help needed turning from your back to your side while in a flat bed without using bedrails?: None Help needed moving from lying on your back to sitting on the side of a flat bed without using bedrails?: A Lot Help needed moving to and from a bed to a chair (including a wheelchair)?: A Little Help needed standing up from a chair using your arms (e.g., wheelchair or bedside chair)?: A Little Help needed to walk in hospital room?: A Little Help needed climbing 3-5 steps with a railing? : A Lot 6 Click Score: 17    End of Session Equipment Utilized During Treatment: Gait belt Activity Tolerance: Patient limited by pain Patient left: in bed;with call bell/phone within reach Nurse Communication: Mobility status PT Visit Diagnosis: Pain;Difficulty in walking, not elsewhere classified (R26.2);Other abnormalities of gait and mobility (R26.89) Pain - Right/Left: Left Pain - part  of body: Hip    Time: 8101-7510 PT Time Calculation (min) (ACUTE ONLY): 23 min   Charges:   PT Evaluation $PT Eval Moderate Complexity: 1 Mod PT Treatments $Gait Training: 8-22 mins          Wyona Almas, PT, DPT Acute Rehabilitation Services Pager [phone] Office [phone]   Deno Etienne 03/03/2020, 10:42 AM

## 2020-03-03 NOTE — H&P (Signed)
History and Physical    Kimberly Pierce QBV:[id number] DOB: 04-30-1956 DOA: 03/03/2020  PCP: Janith Lima, MD (Confirm with patient/family/NH records and if not entered, this has to be entered at [facility] point of entry) Patient coming from: Home  I have personally briefly reviewed patient's old medical records in [place]  Chief Complaint: I fell  HPI: Kimberly Pierce is a 64 y.o. female with medical history significant of HTN, autoimmune hepatitis and cirrhosis on entoCort, lumbar spine OA, HLD, presented with mechanical fall and hip pain.  Patient woke up at night to bathroom and tripped over flooded floor in bathroom and hit left hip, unable to stand up and walk because of severe pain.  Denies any chest pain short of breath lightheadedness palpitations blurry vision before the fall, no loss of consciousness.  Denies any injury to head or neck. ED Course: X-ray showed acute mildly displaced fracture of the left superior and inferior rami.  Review of Systems: As per HPI otherwise 14 point review of systems negative.    Past Medical History:  Diagnosis Date  . Adenomatous duodenal polyp   . Autoimmune hepatitis ([place])   . DVT (deep venous thrombosis) ([place])   . Elevated LFTs   . H/O seasonal allergies   . Hiatal hernia   . Hypertension   . Non-alcoholic cirrhosis ([place])     Past Surgical History:  Procedure Laterality Date  . ABDOMINAL HYSTERECTOMY    . ENDOSCOPIC MUCOSAL RESECTION N/A 01/16/2020   Procedure: ENDOSCOPIC MUCOSAL RESECTION;  Surgeon: Rush Landmark Telford Nab., MD;  Location: [place];  Service: Gastroenterology;  Laterality: N/A;  . ESOPHAGOGASTRODUODENOSCOPY (EGD) WITH PROPOFOL N/A 01/16/2020   Procedure: ESOPHAGOGASTRODUODENOSCOPY (EGD) WITH PROPOFOL;  Surgeon: Rush Landmark Telford Nab., MD;  Location: [place];  Service: Gastroenterology;  Laterality: N/A;  . HEMOSTASIS CLIP PLACEMENT  01/16/2020   Procedure: HEMOSTASIS CLIP PLACEMENT;  Surgeon: Irving Copas., MD;  Location: [place];  Service: Gastroenterology;;  . POLYPECTOMY  01/16/2020   Procedure: POLYPECTOMY;  Surgeon: Irving Copas., MD;  Location: [place];  Service: Gastroenterology;;  . [place] LIFTING INJECTION  01/16/2020   Procedure: SUBMUCOSAL LIFTING INJECTION;  Surgeon: Irving Copas., MD;  Location: [place];  Service: Gastroenterology;;  . [place] INJECTION  01/16/2020   Procedure: SUBMUCOSAL TATTOO INJECTION;  Surgeon: Irving Copas., MD;  Location: [place];  Service: Gastroenterology;;     reports that she has been smoking cigarettes. She has never used smokeless tobacco. She reports current alcohol use. She reports that she does not use drugs.  Allergies  Allergen Reactions  . Percocet [Oxycodone-Acetaminophen] Rash    Rash with Percocet Rxed in ER 08/28/14     Family History  Problem Relation Age of Onset  . Heart attack Father   . Heart attack Mother   . Alcohol abuse Other   . Drug abuse Other   . Hypertension Other   . Thyroid disease Other   . Colon cancer Neg Hx      Prior to Admission medications   Medication Sig Start Date End Date Taking? Authorizing Provider  aspirin EC 81 MG tablet Take 81 mg by mouth daily.   Yes [provider]  budesonide (ENTOCORT EC) 3 MG 24 hr capsule Take 2 capsules (6 mg total) by mouth daily. 02/01/20  Yes Pyrtle, Lajuan Lines, MD  chlorthalidone (HYGROTON) 25 MG tablet Take 1 tablet (25 mg total) by mouth daily. 02/16/19  Yes Janith Lima, MD  Cholecalciferol 25 MCG (1000 UT) tablet Take 1,000 Units by mouth daily.   Yes [provider]  gabapentin (NEURONTIN) 100 MG capsule Take 1-3 capsules (100-300 mg total) by mouth 3 (three) times daily as needed (nerve pain). 02/29/20  Yes Gregor Hams, MD  ibuprofen (ADVIL) 200 MG tablet Take 200-800 mg by mouth every 6 (six) hours as needed for moderate pain.    Yes [provider]  loratadine (CLARITIN)  10 MG tablet Take 10 mg by mouth daily as needed for allergies.    Yes [provider]  Omega-3 Fatty Acids (FISH OIL PO) Take 1 capsule by mouth daily.   Yes [provider]  PFIZER-BIONTECH COVID-19 VACC 30 MCG/0.3ML injection Inject 0.3 mLs into the muscle once. 12/28/19  Yes [provider]  tetrahydrozoline 0.05 % ophthalmic solution Place 1 drop into both eyes 4 (four) times daily as needed (dry eyes, itchy eyes).    Yes [provider]  traMADol (ULTRAM) 50 MG tablet TAKE 1 TABLET BY MOUTH EVERY 8 HOURS AS NEEDED FOR SEVERE PAIN Patient taking differently: Take 50 mg by mouth every 8 (eight) hours as needed for severe pain.  02/29/20  Yes Gregor Hams, MD  TURMERIC PO Take 1 capsule by mouth daily.   Yes [provider]  nebivolol (BYSTOLIC) 5 MG tablet Take 1 tablet (5 mg total) by mouth daily. Patient not taking: Reported on 03/03/2020 02/16/19   Janith Lima, MD  omeprazole (PRILOSEC) 40 MG capsule Take 1 capsule (40 mg total) by mouth 2 (two) times daily before a meal. Patient not taking: Reported on 03/03/2020 01/16/20 01/15/21  Mansouraty, Telford Nab., MD  potassium chloride (K-DUR) 10 MEQ tablet Take 1 tablet (10 mEq total) by mouth daily. Patient not taking: Reported on 10/31/2019 02/16/19   Janith Lima, MD  rosuvastatin (CRESTOR) 5 MG tablet Take 1 tablet (5 mg total) by mouth daily. Patient not taking: Reported on 03/03/2020 02/16/19   Janith Lima, MD    Physical Exam: Vitals:   03/03/20 0758 03/03/20 0900 03/03/20 0930 03/03/20 1000  BP:  (!) 162/77 (!) 164/85 (!) 158/76  Pulse:  65 61 63  Resp:      Temp:      TempSrc:      SpO2:  96% 97% 97%  Weight: 91.2 kg     Height: 5' 6"  (1.676 m)       Constitutional: NAD, calm, comfortable Vitals:   03/03/20 0758 03/03/20 0900 03/03/20 0930 03/03/20 1000  BP:  (!) 162/77 (!) 164/85 (!) 158/76  Pulse:  65 61 63  Resp:      Temp:      TempSrc:      SpO2:  96% 97% 97%  Weight:  91.2 kg     Height: 5' 6"  (1.676 m)      Eyes: PERRL, lids and conjunctivae normal ENMT: Mucous membranes are moist. Posterior pharynx clear of any exudate or lesions.Normal dentition.  Neck: normal, supple, no masses, no thyromegaly Respiratory: clear to auscultation bilaterally, no wheezing, no crackles. Normal respiratory effort. No accessory muscle use.  Cardiovascular: Regular rate and rhythm, no murmurs / rubs / gallops. No extremity edema. 2+ pedal pulses. No carotid bruits.  Abdomen: no tenderness, no masses palpated. No hepatosplenomegaly. Bowel sounds positive.  Musculoskeletal: no clubbing / cyanosis. No joint deformity upper and lower extremities.  Tenderness on palpation pelvic crest, no contractures. Normal muscle tone.  Skin: no rashes, lesions, ulcers. No induration  Neurologic: CN 2-12 grossly intact. Sensation intact, DTR normal. Strength 5/5 in all 4.  Psychiatric: Normal judgment and insight. Alert and oriented x 3. Normal mood.     Labs on Admission: I have personally reviewed following labs and imaging studies  CBC: Recent Labs  Lab 03/03/20 1225  WBC 6.3  HGB 12.6  HCT 38.0  MCV 97.9  PLT 960   Basic Metabolic Panel: Recent Labs  Lab 03/02/20 0907 03/03/20 1225  NA 137 134*  K 4.1 3.6  CL 103 100  CO2 24 22  GLUCOSE 118* 114*  BUN 16 10  CREATININE 0.53 0.48  CALCIUM 9.7 9.6   GFR: Estimated Creatinine Clearance: 79.8 mL/min (by C-G formula based on SCr of 0.48 mg/dL). Liver Function Tests: Recent Labs  Lab 03/02/20 0907  AST 33  ALT 37*  ALKPHOS 107  BILITOT 0.8  PROT 8.3  ALBUMIN 4.7   No results for input(s): LIPASE, AMYLASE in the last 168 hours. No results for input(s): AMMONIA in the last 168 hours. Coagulation Profile: No results for input(s): INR, PROTIME in the last 168 hours. Cardiac Enzymes: No results for input(s): CKTOTAL, CKMB, CKMBINDEX, TROPONINI in the last 168 hours. BNP (last 3 results) No results for input(s):  PROBNP in the last 8760 hours. HbA1C: No results for input(s): HGBA1C in the last 72 hours. CBG: No results for input(s): GLUCAP in the last 168 hours. Lipid Profile: No results for input(s): CHOL, HDL, LDLCALC, TRIG, CHOLHDL, LDLDIRECT in the last 72 hours. Thyroid Function Tests: No results for input(s): TSH, T4TOTAL, FREET4, T3FREE, THYROIDAB in the last 72 hours. Anemia Panel: No results for input(s): VITAMINB12, FOLATE, FERRITIN, TIBC, IRON, RETICCTPCT in the last 72 hours. Urine analysis:    Component Value Date/Time   COLORURINE YELLOW 04/16/2017 1532   APPEARANCEUR Sl Cloudy (A) 04/16/2017 1532   LABSPEC >=1.030 (A) 04/16/2017 1532   PHURINE 6.0 04/16/2017 1532   GLUCOSEU NEGATIVE 04/16/2017 1532   HGBUR NEGATIVE 04/16/2017 1532   BILIRUBINUR NEGATIVE 04/16/2017 1532   KETONESUR NEGATIVE 04/16/2017 1532   UROBILINOGEN 0.2 04/16/2017 1532   NITRITE NEGATIVE 04/16/2017 1532   LEUKOCYTESUR NEGATIVE 04/16/2017 1532    Radiological Exams on Admission: DG Hip Unilat With Pelvis 2-3 Views Left  Result Date: 03/03/2020 CLINICAL DATA:  Pain.  Fall. EXAM: DG HIP (WITH OR WITHOUT PELVIS) 2-3V LEFT COMPARISON:  02/27/2021w FINDINGS: Acute mildly displaced fractures of left superior and inferior pubic rami. No evidence of hip fracture. Hips are located. Similar severe right hip degenerative change with severe joint space narrowing, osteophytes, and juxta-articular sclerosis. Mild to moderate left hip degenerative change. Osteopenia. Atherosclerotic calcifications. IMPRESSION: 1. Acute mildly displaced fractures of left superior and inferior pubic rami. 2. Similar severe right and mild-to-moderate left hip degenerative change. Electronically Signed   By: Margaretha Sheffield MD   On: 03/03/2020 09:02    EKG: Ordered  Assessment/Plan Active Problems:   Pelvic fracture ([place])  (please populate well all problems here in Problem List. (For example, if patient is on BP meds at home and you  resume or decide to hold them, it is a problem that needs to be her. Same for CAD, COPD, HLD and so on)  Acute pelvic fracture secondary to mechanical fall -Orthopedic surgery was contacted in the ED, recommend conservative management of pain control and PT evaluation -Tylenol, NSAIDs and narcotics alternate  HTN -Controlled, continue home regimen  Autoimmune hepatitis -Continue Entocort and outpatient GI follow-up  Lumbar spine OA -Continue gabapentin  DVT prophylaxis: Lovenox Code Status: Full Code Family Communication: None at bedside Disposition Plan: Expect more than two midnight hospital stay for pain control and PT evaluation, may need rehab Consults called: Orthopedic surgery Admission status: Medsurg   Lequita Halt MD [facility] Pager [phone]  03/03/2020, 2:54 PM

## 2020-03-03 NOTE — ED Notes (Signed)
Attempt x1 to give report

## 2020-03-03 NOTE — Progress Notes (Signed)
Orthopaedics aware of admission for failure to mobilize after ground level fall resulted in mildly displaced pelvic ring fractures on the left. Also severe hip arthritis on the right.  Agree with plan for admission, PT/OT, and office follow up next week.   [place] , MD [facility], [facility] [phone]

## 2020-03-04 DIAGNOSIS — I10 Essential (primary) hypertension: Secondary | ICD-10-CM

## 2020-03-04 DIAGNOSIS — W19XXXA Unspecified fall, initial encounter: Secondary | ICD-10-CM

## 2020-03-04 LAB — CBC
HCT: 36.4 % (ref 36.0–46.0)
Hemoglobin: 12.1 g/dL (ref 12.0–15.0)
MCH: 32.7 pg (ref 26.0–34.0)
MCHC: 33.2 g/dL (ref 30.0–36.0)
MCV: 98.4 fL (ref 80.0–100.0)
Platelets: 154 10*3/uL (ref 150–400)
RBC: 3.7 MIL/uL — ABNORMAL LOW (ref 3.87–5.11)
RDW: 12.4 % (ref 11.5–15.5)
WBC: 4.9 10*3/uL (ref 4.0–10.5)
nRBC: 0 % (ref 0.0–0.2)

## 2020-03-04 LAB — HIV ANTIBODY (ROUTINE TESTING W REFLEX): HIV Screen 4th Generation wRfx: NONREACTIVE

## 2020-03-04 MED ORDER — NEBIVOLOL HCL 10 MG PO TABS
5.0000 mg | ORAL_TABLET | Freq: Every day | ORAL | Status: DC
  Administered 2020-03-04 – 2020-03-06 (×3): 5 mg via ORAL
  Filled 2020-03-04 (×3): qty 1

## 2020-03-04 MED ORDER — HYDRALAZINE HCL 20 MG/ML IJ SOLN
10.0000 mg | Freq: Four times a day (QID) | INTRAMUSCULAR | Status: DC | PRN
  Administered 2020-03-04: 10 mg via INTRAVENOUS
  Filled 2020-03-04: qty 1

## 2020-03-04 MED ORDER — TRAMADOL HCL 50 MG PO TABS
50.0000 mg | ORAL_TABLET | Freq: Four times a day (QID) | ORAL | Status: DC | PRN
  Administered 2020-03-04 – 2020-03-06 (×3): 50 mg via ORAL
  Filled 2020-03-04 (×3): qty 1

## 2020-03-04 MED ORDER — TRAMADOL HCL 50 MG PO TABS
50.0000 mg | ORAL_TABLET | Freq: Four times a day (QID) | ORAL | Status: DC | PRN
Start: 2020-03-04 — End: 2020-03-04

## 2020-03-04 MED ORDER — GABAPENTIN 100 MG PO CAPS
100.0000 mg | ORAL_CAPSULE | Freq: Three times a day (TID) | ORAL | Status: DC
  Administered 2020-03-04 – 2020-03-06 (×6): 100 mg via ORAL
  Filled 2020-03-04 (×6): qty 1

## 2020-03-04 NOTE — Progress Notes (Signed)
[facility]                                                                              Patient Demographics  Kimberly Pierce, is a 64 y.o. female, DOB - 31-Dec-1955, FOY:[id number]  Admit date - 03/03/2020   Admitting Physician Lequita Halt, MD  Outpatient Primary MD for the patient is Janith Lima, MD  Outpatient specialists:   LOS - 1  days   Medical records reviewed and are as summarized below:    Chief Complaint  Patient presents with  . Fall       Brief summary   Patient is a 64 year old female with history of hypertension, autoimmune hepatitis and cirrhosis on Entocort, lumbar spine osteoarthritis, hyperlipidemia presented with mechanical fall and hip pain.  Patient woke up to use the bathroom, slipped and fell onto the bathroom floor.  Patient stated that the floor was "slick".  She got up in the morning and was unable to walk due to hip pain.   Pain was constant, worse with palpation and movement.  Denied any loss of consciousness, any chest pain or shortness of breath, lightheadedness or palpitations prior to the fall.  Did not hit her head. X-ray showed acute mildly displaced fractures of the left superior and inferior pubic rami. Severe right and mild to moderate left hip degenerative change.  Orthopedics was consulted  Assessment & Plan    Principal Problem: Mildly displaced pelvic fracture on the left [facility]) -Orthopedics was consulted, recommended PT OT , pain control and office follow-up in next week -At the time of my examination, patient had just worked with OT this morning, sitting up in the chair, in considerable pain, 7/10 -Continue IV Dilaudid as needed for severe pain, per patient she takes Neurontin 3 times a day, not as needed, resumed Neurontin 100 mg 3 times daily, will verify PTA dose from pharmacy -Tramadol 50 mg every 6 hours as needed for moderate pain -PT OT continue working with the patient, will need home health PT OT,  DME  Active Problems:    Essential hypertension -BP currently elevated, likely due to pain -Reviewed PCP notes, resume Bystolic 5 mg daily -Continue chlorthalidone, added hydralazine IV as needed with parameters    Osteoporosis, vitamin D deficiency -Follow vitamin D level    Liver cirrhosis secondary to NASH ([place]) -Follows GI outpatient (Dr. Hilarie Fredrickson), last visit on 02/01/2020 -Patient was placed on budesonide, will continue -No evidence of hepatic encephalopathy or ascites  History of duodenal adenoma -Status post endoscopic mucosal resection by Dr. Rush Landmark, will need endoscopy in 6 months  Obesity Estimated body mass index is 32.44 kg/m as calculated from the following:   Height as of this encounter: 5' 6"  (1.676 m).   Weight as of this encounter: 91.2 kg.  Code Status: Full code DVT Prophylaxis:  Lovenox  Family Communication: Discussed all imaging results, lab results, explained to the patient    Disposition Plan:     Status is: Inpatient  Remains inpatient appropriate because:Inpatient level of care appropriate due to severity of illness   Dispo: The patient is from: Home  Anticipated d/c is to: Home              Anticipated d/c date is: 2 days              Patient currently is not medically stable to d/c.  Still in significant pain, needing IV pain control, not safe for discharge today.     Time Spent in minutes 25 minutes  Procedures:  None  Consultants:   Orthopedics  Antimicrobials:   Anti-infectives (From admission, onward)   None         Medications  Scheduled Meds: . aspirin EC  81 mg Oral Daily  . budesonide  6 mg Oral Daily  . chlorthalidone  25 mg Oral Daily  . cholecalciferol  1,000 Units Oral Daily  . enoxaparin (LOVENOX) injection  40 mg Subcutaneous Q24H  . lidocaine  1 patch Transdermal Q24H   Continuous Infusions: PRN Meds:.gabapentin, HYDROmorphone (DILAUDID) injection, ibuprofen, loratadine,  naphazoline-glycerin, ondansetron **OR** ondansetron (ZOFRAN) IV      Subjective:   Kimberly Pierce was seen and examined today.  Had just completed OT evaluation prior to my encounter.  Complaining of pain in the left pelvic region 7/10, sharp and constant, worse with movement.  Patient denies dizziness, chest pain, shortness of breath, abdominal pain, N/V/D/C.  Afebrile  Objective:   Vitals:   03/03/20 1820 03/03/20 2054 03/04/20 0204 03/04/20 0451  BP: (!) 179/81 (!) 170/61 (!) 151/62 (!) 172/56  Pulse: 66 70 72 63  Resp: 18 17 17 16   Temp: 98.4 F (36.9 C) 98.5 F (36.9 C) 98.4 F (36.9 C) 98.1 F (36.7 C)  TempSrc: Oral Oral Oral Oral  SpO2: 100% 97% 97% 95%  Weight:      Height:        Intake/Output Summary (Last 24 hours) at 03/04/2020 1003 Last data filed at 03/04/2020 0500 Gross per 24 hour  Intake 120 ml  Output --  Net 120 ml     Wt Readings from Last 3 Encounters:  03/03/20 91.2 kg  02/01/20 91.1 kg  01/16/20 89.8 kg     Exam  General: Alert and oriented x 3, NAD  Cardiovascular: S1 S2 auscultated, no murmurs, RRR  Respiratory: Clear to auscultation bilaterally, no wheezing, rales or rhonchi  Gastrointestinal: Soft, nontender, nondistended, + bowel sounds  Ext: no pedal edema bilaterally  Neuro: no new deficits  Musculoskeletal: No digital cyanosis, clubbing  Skin: No rashes  Psych: Normal affect and demeanor, alert and oriented x3    Data Reviewed:  I have personally reviewed following labs and imaging studies  Micro Results Recent Results (from the past 240 hour(s))  Respiratory Panel by RT PCR (Flu A&B, Covid) - Nasopharyngeal Swab     Status: None   Collection Time: 03/03/20 12:30 PM   Specimen: Nasopharyngeal Swab  Result Value Ref Range Status   SARS Coronavirus 2 by RT PCR NEGATIVE NEGATIVE Final    Comment: (NOTE) SARS-CoV-2 target nucleic acids are NOT DETECTED.  The SARS-CoV-2 RNA is generally detectable in upper  respiratoy specimens during the acute phase of infection. The lowest concentration of SARS-CoV-2 viral copies this assay can detect is 131 copies/mL. A negative result does not preclude SARS-Cov-2 infection and should not be used as the sole basis for treatment or other patient management decisions. A negative result may occur with  improper specimen collection/handling, submission of specimen other than nasopharyngeal swab, presence of viral mutation(s) within the areas targeted by this assay, and inadequate number of  viral copies (<131 copies/mL). A negative result must be combined with clinical observations, patient history, and epidemiological information. The expected result is Negative.  Fact Sheet for Patients:  PinkCheek.be  Fact Sheet for Healthcare Providers:  GravelBags.it  This test is no t yet approved or cleared by the [place] FDA and  has been authorized for detection and/or diagnosis of SARS-CoV-2 by FDA under an Emergency Use Authorization (EUA). This EUA will remain  in effect (meaning this test can be used) for the duration of the COVID-19 declaration under Section 564(b)(1) of the Act, 21 U.S.C. section 360bbb-3(b)(1), unless the authorization is terminated or revoked sooner.     Influenza A by PCR NEGATIVE NEGATIVE Final   Influenza B by PCR NEGATIVE NEGATIVE Final    Comment: (NOTE) The Xpert Xpress SARS-CoV-2/FLU/RSV assay is intended as an aid in  the diagnosis of influenza from Nasopharyngeal swab specimens and  should not be used as a sole basis for treatment. Nasal washings and  aspirates are unacceptable for Xpert Xpress SARS-CoV-2/FLU/RSV  testing.  Fact Sheet for Patients: PinkCheek.be  Fact Sheet for Healthcare Providers: GravelBags.it  This test is not yet approved or cleared by the [place] FDA and  has been  authorized for detection and/or diagnosis of SARS-CoV-2 by  FDA under an Emergency Use Authorization (EUA). This EUA will remain  in effect (meaning this test can be used) for the duration of the  Covid-19 declaration under Section 564(b)(1) of the Act, 21  U.S.C. section 360bbb-3(b)(1), unless the authorization is  terminated or revoked. Performed at [facility], [place] [address], [place], [place] [address]     Radiology Reports DG Hip Unilat With Pelvis 2-3 Views Left  Result Date: 03/03/2020 CLINICAL DATA:  Pain.  Fall. EXAM: DG HIP (WITH OR WITHOUT PELVIS) 2-3V LEFT COMPARISON:  02/27/2021w FINDINGS: Acute mildly displaced fractures of left superior and inferior pubic rami. No evidence of hip fracture. Hips are located. Similar severe right hip degenerative change with severe joint space narrowing, osteophytes, and juxta-articular sclerosis. Mild to moderate left hip degenerative change. Osteopenia. Atherosclerotic calcifications. IMPRESSION: 1. Acute mildly displaced fractures of left superior and inferior pubic rami. 2. Similar severe right and mild-to-moderate left hip degenerative change. Electronically Signed   By: Margaretha Sheffield MD   On: 03/03/2020 09:02    Lab Data:  CBC: Recent Labs  Lab 03/03/20 1225 03/04/20 0551  WBC 6.3 4.9  HGB 12.6 12.1  HCT 38.0 36.4  MCV 97.9 98.4  PLT 174 409   Basic Metabolic Panel: Recent Labs  Lab 03/02/20 0907 03/03/20 1225  NA 137 134*  K 4.1 3.6  CL 103 100  CO2 24 22  GLUCOSE 118* 114*  BUN 16 10  CREATININE 0.53 0.48  CALCIUM 9.7 9.6   GFR: Estimated Creatinine Clearance: 79.8 mL/min (by C-G formula based on SCr of 0.48 mg/dL). Liver Function Tests: Recent Labs  Lab 03/02/20 0907  AST 33  ALT 37*  ALKPHOS 107  BILITOT 0.8  PROT 8.3  ALBUMIN 4.7   No results for input(s): LIPASE, AMYLASE in the last 168 hours. No results for input(s): AMMONIA in the last 168 hours. Coagulation Profile: No results for  input(s): INR, PROTIME in the last 168 hours. Cardiac Enzymes: No results for input(s): CKTOTAL, CKMB, CKMBINDEX, TROPONINI in the last 168 hours. BNP (last 3 results) No results for input(s): PROBNP in the last 8760 hours. HbA1C: No results for input(s): HGBA1C in the last 72 hours.  CBG: No results for input(s): GLUCAP in the last 168 hours. Lipid Profile: No results for input(s): CHOL, HDL, LDLCALC, TRIG, CHOLHDL, LDLDIRECT in the last 72 hours. Thyroid Function Tests: No results for input(s): TSH, T4TOTAL, FREET4, T3FREE, THYROIDAB in the last 72 hours. Anemia Panel: No results for input(s): VITAMINB12, FOLATE, FERRITIN, TIBC, IRON, RETICCTPCT in the last 72 hours. Urine analysis:    Component Value Date/Time   COLORURINE YELLOW 04/16/2017 1532   APPEARANCEUR Sl Cloudy (A) 04/16/2017 1532   LABSPEC >=1.030 (A) 04/16/2017 1532   PHURINE 6.0 04/16/2017 1532   GLUCOSEU NEGATIVE 04/16/2017 1532   HGBUR NEGATIVE 04/16/2017 1532   BILIRUBINUR NEGATIVE 04/16/2017 1532   KETONESUR NEGATIVE 04/16/2017 1532   UROBILINOGEN 0.2 04/16/2017 1532   NITRITE NEGATIVE 04/16/2017 1532   LEUKOCYTESUR NEGATIVE 04/16/2017 1532     Abrham Maslowski M.D. [facility] 03/04/2020, 10:03 AM   Call night coverage person covering after 7pm

## 2020-03-04 NOTE — Progress Notes (Signed)
Physical Therapy Treatment Patient Details Name: Kimberly Pierce MRN: [id number] DOB: Oct 27, 1955 Today's Date: 03/04/2020    History of Present Illness Pt is a 64 y.o. F with significant PMH of osteoporosis, liver cirrhosis, tobacco use, compression fracture of lumbar vertebrae who presents after a fall with acute mildly displaced fractures of left superior and inferior pubic rami.     PT Comments    Pt making steady progress towards her physical therapy goals; improvement in pain control today compared to yesterday. Session focused on therapeutic exercises for BLE strengthening and gait training. Pt ambulating 40 feet with a walker at a min guard assist level, utilizing a step to pattern. Will continue to follow acutely to progress mobility as tolerated.     Follow Up Recommendations  Home health PT;Supervision/Assistance - 24 hour     Equipment Recommendations  Rolling walker with 5" wheels;3in1 (PT)    Recommendations for Other Services       Precautions / Restrictions Precautions Precautions: Fall Restrictions Weight Bearing Restrictions: No    Mobility  Bed Mobility Overal bed mobility: Needs Assistance Bed Mobility: Supine to Sit;Sit to Supine     Supine to sit: Supervision Sit to supine: Min assist   General bed mobility comments: Pt able to progress to edge of bed with significant time/effort, use of bed rail. MinA for LE negotiation back into bed  Transfers Overall transfer level: Needs assistance Equipment used: Rolling walker (2 wheeled) Transfers: Sit to/from Stand Sit to Stand: Min guard         General transfer comment: cues for hand placement, increased time to rise  Ambulation/Gait Ambulation/Gait assistance: Min guard Gait Distance (Feet): 40 Feet Assistive device: Rolling walker (2 wheeled) Gait Pattern/deviations: Step-to pattern;Decreased stance time - left;Decreased weight shift to left;Antalgic;Trunk flexed Gait velocity: decreased Gait  velocity interpretation: <1.8 ft/sec, indicate of risk for recurrent falls General Gait Details: Cues for sequencing, upright posture, walker use   Stairs             Wheelchair Mobility    Modified Rankin (Stroke Patients Only)       Balance Overall balance assessment: Needs assistance Sitting-balance support: Feet supported Sitting balance-Leahy Scale: Fair     Standing balance support: Bilateral upper extremity supported Standing balance-Leahy Scale: Poor                              Cognition Arousal/Alertness: Awake/alert Behavior During Therapy: WFL for tasks assessed/performed Overall Cognitive Status: Within Functional Limits for tasks assessed                                        Exercises General Exercises - Lower Extremity Quad Sets: Both;15 reps;Supine Long Arc Quad: Both;10 reps;Supine Heel Slides: Both;10 reps;Supine    General Comments        Pertinent Vitals/Pain Pain Assessment: Faces Faces Pain Scale: Hurts even more Pain Location: L posterior hip Pain Descriptors / Indicators: Aching;Grimacing;Guarding Pain Intervention(s): Monitored during session;Limited activity within patient's tolerance    Home Living                      Prior Function            PT Goals (current goals can now be found in the care plan section) Acute Rehab PT Goals Patient Stated Goal: return  to work Potential to Achieve Goals: Good Progress towards PT goals: Progressing toward goals    Frequency    Min 5X/week      PT Plan Current plan remains appropriate    Co-evaluation              AM-PAC PT "6 Clicks" Mobility   Outcome Measure  Help needed turning from your back to your side while in a flat bed without using bedrails?: None Help needed moving from lying on your back to sitting on the side of a flat bed without using bedrails?: None Help needed moving to and from a bed to a chair (including a  wheelchair)?: A Little Help needed standing up from a chair using your arms (e.g., wheelchair or bedside chair)?: A Little Help needed to walk in hospital room?: A Little Help needed climbing 3-5 steps with a railing? : A Lot 6 Click Score: 19    End of Session   Activity Tolerance: Patient tolerated treatment well Patient left: in bed;with call bell/phone within reach Nurse Communication: Mobility status PT Visit Diagnosis: Pain;Difficulty in walking, not elsewhere classified (R26.2);Other abnormalities of gait and mobility (R26.89) Pain - Right/Left: Left Pain - part of body: Hip     Time: 4039-7953 PT Time Calculation (min) (ACUTE ONLY): 26 min  Charges:  $Gait Training: 8-22 mins $Therapeutic Exercise: 8-22 mins                       Kimberly Pierce, PT, DPT Acute Rehabilitation Services Pager [phone] Office [phone]    Kimberly Pierce 03/04/2020, 4:30 PM

## 2020-03-04 NOTE — Progress Notes (Signed)
Occupational Therapy Evaluation Patient Details Name: Kimberly Pierce MRN: [id number] DOB: January 26, 1956 Today's Date: 03/04/2020    History of Present Illness Pt is a 64 y.o. F with significant PMH of osteoporosis, liver cirrhosis, tobacco use, compression fracture of lumbar vertebrae who presents after a fall with acute mildly displaced fractures of left superior and inferior pubic rami.    Clinical Impression   PTA patient was independent with BADLs/IADLs and was working in [employer] at [employer]. Patient currently presents below baseline level of function requiring Mod A for bed mobility, Mod A for LB bathing/dressing and Mod A for ADL transfers with use of RW. Patient also limited by pain and with movement in L hip. Patient would benefit from acute OT services to maximize safety and independence with self-care tasks in prep for safe d/c home with 24hr supervision/assist from family/friends.     Follow Up Recommendations  Home health OT;Supervision/Assistance - 24 hour    Equipment Recommendations  3 in 1 bedside commode;Tub/shower bench    Recommendations for Other Services       Precautions / Restrictions Precautions Precautions: Fall Restrictions Weight Bearing Restrictions: No      Mobility Bed Mobility Overal bed mobility: Needs Assistance Bed Mobility: Supine to Sit     Supine to sit: Mod assist     General bed mobility comments: Mod A at BLE and trunk with HOB elevated and use of bedrail  Transfers Overall transfer level: Needs assistance Equipment used: Rolling walker (2 wheeled) Transfers: Sit to/from Stand Sit to Stand: Min assist         General transfer comment: MinA to rise to stand from edge of bed    Balance Overall balance assessment: Needs assistance Sitting-balance support: Feet supported Sitting balance-Leahy Scale: Fair     Standing balance support: Bilateral upper extremity supported Standing balance-Leahy Scale: Poor                              ADL either performed or assessed with clinical judgement   ADL Overall ADL's : Needs assistance/impaired     Grooming: Set up;Sitting           Upper Body Dressing : Set up;Sitting Upper Body Dressing Details (indicate cue type and reason): To don posterior hospital gown seated EOB Lower Body Dressing: Maximal assistance;Sitting/lateral leans Lower Body Dressing Details (indicate cue type and reason): To don footwear seated EOB 2/2 pain Toilet Transfer: Moderate assistance;Stand-pivot;RW Toilet Transfer Details (indicate cue type and reason): Simulated with SPT from EOB to recliner with Mod A and use of RW                 Vision Baseline Vision/History: No visual deficits Vision Assessment?: No apparent visual deficits     Perception Perception Perception Tested?: No   Praxis Praxis Praxis tested?: Not tested    Pertinent Vitals/Pain Pain Assessment: 0-10 Pain Score: 7  Pain Location: L posterior hip with hip flexion and weightbearing Pain Descriptors / Indicators: Aching;Grimacing;Guarding Pain Intervention(s): Limited activity within patient's tolerance;Premedicated before session;Monitored during session;Repositioned     Hand Dominance Right   Extremity/Trunk Assessment Upper Extremity Assessment Upper Extremity Assessment: Overall WFL for tasks assessed   Lower Extremity Assessment Lower Extremity Assessment: Defer to PT evaluation   Cervical / Trunk Assessment Cervical / Trunk Assessment: Normal   Communication Communication Communication: No difficulties   Cognition Arousal/Alertness: Awake/alert Behavior During Therapy: WFL for tasks assessed/performed Overall Cognitive  Status: Within Functional Limits for tasks assessed                                     General Comments       Exercises     Shoulder Instructions      Home Living Family/patient expects to be discharged to:: Private residence Living  Arrangements: Non-relatives/Friends Available Help at Discharge: Friend(s) Type of Home: Apartment Home Access: Level entry     Home Layout: One level     Bathroom Shower/Tub: Teacher, early years/pre: Standard Bathroom Accessibility: Yes How Accessible: Accessible via walker Home Equipment: None          Prior Functioning/Environment Level of Independence: Independent        Comments: Housekeeping at [employer] List: Decreased range of motion;Decreased activity tolerance;Impaired balance (sitting and/or standing);Decreased knowledge of use of DME or AE;Pain      OT Treatment/Interventions: Self-care/ADL training;Therapeutic exercise;Energy conservation;DME and/or AE instruction;Therapeutic activities;Patient/family education;Balance training    OT Goals(Current goals can be found in the care plan section) Acute Rehab OT Goals Patient Stated Goal: return to work OT Goal Formulation: With patient Time For Goal Achievement: 03/18/20 Potential to Achieve Goals: Good ADL Goals Pt Will Perform Grooming: with modified independence;standing Pt Will Perform Upper Body Dressing: sitting;Independently Pt Will Perform Lower Body Dressing: with modified independence;sitting/lateral leans;sit to/from stand Pt Will Transfer to Toilet: with modified independence;ambulating;bedside commode Pt Will Perform Toileting - Clothing Manipulation and hygiene: with modified independence;sitting/lateral leans;sit to/from stand Additional ADL Goal #1: Patient will recall 3 fall prevention strategies in prep for safe d/c home.  OT Frequency: Min 3X/week   Barriers to D/C:            Co-evaluation              AM-PAC OT "6 Clicks" Daily Activity     Outcome Measure Help from another person eating meals?: None Help from another person taking care of personal grooming?: None Help from another person toileting, which includes using toliet, bedpan, or  urinal?: A Little Help from another person bathing (including washing, rinsing, drying)?: A Lot Help from another person to put on and taking off regular upper body clothing?: None Help from another person to put on and taking off regular lower body clothing?: A Lot 6 Click Score: 19   End of Session Equipment Utilized During Treatment: Gait belt;Rolling walker Nurse Communication: Mobility status  Activity Tolerance: Patient limited by pain Patient left: in chair;with call bell/phone within reach;with chair alarm set  OT Visit Diagnosis: Unsteadiness on feet (R26.81);Muscle weakness (generalized) (M62.81);History of falling (Z91.81)                Time: 0626-9485 OT Time Calculation (min): 20 min Charges:  OT General Charges $OT Visit: 1 Visit OT Evaluation $OT Eval Moderate Complexity: 1 Mod  Jarelyn Bambach H. OTR/L Supplemental OT, Department of rehab services [phone]  Chevette Fee R H. 03/04/2020, 9:07 AM

## 2020-03-04 NOTE — Progress Notes (Signed)
Per Dr. Josem Kaufmann consult, reviewed outpatient records for gabapentin instructions. Outpatient script for gabapentin 168m-300mg TID PRN. Spoke with patient and confirmed she takes 1050mTID on a regular basis. Current order reflects this dosing.   Thank you for contacting pharmacy for assistance,  AmAlfonse SprucePharmD PGY2 ID Pharmacy Resident Phone between 7 am - 3:30 pm: [phone] check AMION for all [facility] numbers After 10:00 PM, call [facility] [phone]

## 2020-03-05 ENCOUNTER — Ambulatory Visit (HOSPITAL_COMMUNITY): Payer: 59

## 2020-03-05 ENCOUNTER — Inpatient Hospital Stay (HOSPITAL_COMMUNITY): Payer: 59

## 2020-03-05 ENCOUNTER — Other Ambulatory Visit: Payer: Self-pay

## 2020-03-05 DIAGNOSIS — S32301A Unspecified fracture of right ilium, initial encounter for closed fracture: Secondary | ICD-10-CM

## 2020-03-05 DIAGNOSIS — K7460 Unspecified cirrhosis of liver: Secondary | ICD-10-CM

## 2020-03-05 LAB — VITAMIN D 25 HYDROXY (VIT D DEFICIENCY, FRACTURES): Vit D, 25-Hydroxy: 39.71 ng/mL (ref 30–100)

## 2020-03-05 MED ORDER — KETOROLAC TROMETHAMINE 30 MG/ML IJ SOLN
30.0000 mg | Freq: Once | INTRAMUSCULAR | Status: AC
  Administered 2020-03-05: 30 mg via INTRAVENOUS
  Filled 2020-03-05: qty 1

## 2020-03-05 MED ORDER — POLYETHYLENE GLYCOL 3350 17 G PO PACK
17.0000 g | PACK | Freq: Every day | ORAL | Status: DC
  Administered 2020-03-05 – 2020-03-06 (×2): 17 g via ORAL
  Filled 2020-03-05 (×2): qty 1

## 2020-03-05 MED ORDER — KETOROLAC TROMETHAMINE 15 MG/ML IJ SOLN
15.0000 mg | Freq: Four times a day (QID) | INTRAMUSCULAR | Status: DC
  Administered 2020-03-05 – 2020-03-06 (×4): 15 mg via INTRAVENOUS
  Filled 2020-03-05 (×4): qty 1

## 2020-03-05 MED ORDER — INFLUENZA VAC A&B SA ADJ QUAD 0.5 ML IM PRSY
0.5000 mL | PREFILLED_SYRINGE | Freq: Once | INTRAMUSCULAR | Status: AC
  Administered 2020-03-06: 0.5 mL via INTRAMUSCULAR
  Filled 2020-03-05: qty 0.5

## 2020-03-05 MED ORDER — PNEUMOCOCCAL VAC POLYVALENT 25 MCG/0.5ML IJ INJ
0.5000 mL | INJECTION | Freq: Once | INTRAMUSCULAR | Status: AC
  Administered 2020-03-06: 0.5 mL via INTRAMUSCULAR
  Filled 2020-03-05: qty 0.5

## 2020-03-05 MED ORDER — DOCUSATE SODIUM 100 MG PO CAPS
100.0000 mg | ORAL_CAPSULE | Freq: Two times a day (BID) | ORAL | Status: DC
  Administered 2020-03-05 – 2020-03-06 (×3): 100 mg via ORAL
  Filled 2020-03-05 (×2): qty 1

## 2020-03-05 NOTE — Progress Notes (Signed)
Occupational Therapy Treatment Patient Details Name: Kimberly Pierce MRN: [id number] DOB: Kimberly Pierce Today's Date: 03/05/2020    History of present illness Pt is a 64 y.o. F with significant PMH of osteoporosis, liver cirrhosis, tobacco use, compression fracture of lumbar vertebrae who presents after a fall with acute mildly displaced fractures of left superior and inferior pubic rami.    OT comments  Pt making steady progress towards OT goals this session. Session focus on LB AE training for LB dressing. Pt completing stand pivot transfer from BSC>recliner with RW and NT upon arrival. Observed pt needing min guard- min A for transfer with increased time noted. Education provided on all LB AE for bathing and dressing with pt needing MAX- supervision for all LB ADLs. Pt reports living with friend and son that can assist with ADL/ IADLs at home. Pt agreeable to DME listed below, but would benefit from shower transfer training. Pt would continue to benefit from skilled occupational therapy while admitted and after d/c to address the below listed limitations in order to improve overall functional mobility and facilitate independence with BADL participation. DC plan remains appropriate, will follow acutely per POC.     Follow Up Recommendations  Home health OT;Supervision/Assistance - 24 hour    Equipment Recommendations  3 in 1 bedside commode;Tub/shower bench    Recommendations for Other Services      Precautions / Restrictions Precautions Precautions: Fall Restrictions Weight Bearing Restrictions: Yes RLE Weight Bearing: Weight bearing as tolerated LLE Weight Bearing: Weight bearing as tolerated       Mobility Bed Mobility               General bed mobility comments: pt OOB with NT upon arrival completing SPT to recliner with RW  Transfers                 General transfer comment: pt completed stand pivot transfer from BSC> recliner with RW upon OTA entry    Balance  Overall balance assessment: Needs assistance Sitting-balance support: Feet supported Sitting balance-Leahy Scale: Fair                                     ADL either performed or assessed with clinical judgement   ADL Overall ADL's : Needs assistance/impaired     Grooming: Set up;Sitting;Oral care Grooming Details (indicate cue type and reason): supervision- set- up from sitting in recliner     Lower Body Bathing: Supervison/ safety;Set up;Sitting/lateral leans Lower Body Bathing Details (indicate cue type and reason): using LH sponge for LB bathing from sitting in recliner     Lower Body Dressing: Maximal assistance;Sitting/lateral leans;Cueing for sequencing;With adaptive equipment Lower Body Dressing Details (indicate cue type and reason): education provided on all LB AE for dressing. MAX A to don socks from recliner with sock aid, cues for sequencing task with sock aid. pt able to thread pants with reacher with MOD A needing cues for sequencing and overall technique           Tub/Shower Transfer Details (indicate cue type and reason): pt reports tub shower at home, agreeable to TTB, woud benefit from transfer training next session   General ADL Comments: education provided on all LB AE for bathing and dressing. pt completing SPT with NT upon entry with RW     Vision       Perception     Praxis  Cognition Arousal/Alertness: Awake/alert Behavior During Therapy: WFL for tasks assessed/performed Overall Cognitive Status: Within Functional Limits for tasks assessed                                 General Comments: asking appropriate questions about returning to [facility]        Exercises     Shoulder Instructions       General Comments      Pertinent Vitals/ Pain       Pain Assessment: Faces Faces Pain Scale: Hurts little more Pain Location: L hip Pain Descriptors / Indicators: Aching;Grimacing;Guarding Pain Intervention(s):  Limited activity within patient's tolerance;Monitored during session;Repositioned  Home Living                                          Prior Functioning/Environment              Frequency  Min 3X/week        Progress Toward Goals  OT Goals(current goals can now be found in the care plan section)  Progress towards OT goals: Progressing toward goals  Acute Rehab OT Goals OT Goal Formulation: With patient Time For Goal Achievement: 03/18/20 Potential to Achieve Goals: Good  Plan Discharge plan remains appropriate;Frequency remains appropriate    Co-evaluation                 AM-PAC OT "6 Clicks" Daily Activity     Outcome Measure   Help from another person eating meals?: None Help from another person taking care of personal grooming?: None Help from another person toileting, which includes using toliet, bedpan, or urinal?: A Little Help from another person bathing (including washing, rinsing, drying)?: A Lot Help from another person to put on and taking off regular upper body clothing?: None Help from another person to put on and taking off regular lower body clothing?: A Little 6 Click Score: 20    End of Session Equipment Utilized During Treatment: Other (comment) (LB AE)  OT Visit Diagnosis: Unsteadiness on feet (R26.81);Muscle weakness (generalized) (M62.81);History of falling (Z91.81)   Activity Tolerance Patient tolerated treatment well   Patient Left in chair;with call bell/phone within reach;with chair alarm set   Nurse Communication Mobility status        Time: 4827-0786 OT Time Calculation (min): 15 min  Charges: OT General Charges $OT Visit: 1 Visit OT Treatments $Self Care/Home Management : 8-22 mins  Lanier Clam., COTA/L Acute Rehabilitation Services [phone]    [name] 03/05/2020, 9:44 AM

## 2020-03-05 NOTE — Progress Notes (Signed)
[facility]                                                                              Patient Demographics  Kimberly Pierce, is a 64 y.o. female, DOB - 1955/07/24, TDV:[id number]  Admit date - 03/03/2020   Admitting Physician Lequita Halt, MD  Outpatient Primary MD for the patient is Janith Lima, MD  Outpatient specialists:   LOS - 2  days   Medical records reviewed and are as summarized below:    Chief Complaint  Patient presents with  . Fall       Brief summary   Patient is a 64 year old female with history of hypertension, autoimmune hepatitis and cirrhosis on Entocort, lumbar spine osteoarthritis, hyperlipidemia presented with mechanical fall and hip pain.  Patient woke up to use the bathroom, slipped and fell onto the bathroom floor.  Patient stated that the floor was "slick".  She got up in the morning and was unable to walk due to hip pain.   Pain was constant, worse with palpation and movement.  Denied any loss of consciousness, any chest pain or shortness of breath, lightheadedness or palpitations prior to the fall.  Did not hit her head. X-ray showed acute mildly displaced fractures of the left superior and inferior pubic rami. Severe right and mild to moderate left hip degenerative change.  Orthopedics was consulted  Assessment & Plan    Principal Problem: Mildly displaced pelvic fracture on the left [facility]) -Orthopedics was consulted, recommended PT OT , pain control and office follow-up in next week -Complaining of pain in the left hip and pelvic area.  She states that she did well with PT yesterday however thinks she overdid it and now more pain, does not feel comfortable going home today. -Continue IV Dilaudid as needed for the severe pain or breakthrough, added Toradol 30 mg IV x1, then 15 mg every 6 hours, continue with PT  -Tramadol 50 mg every 6 hours as needed for moderate pain - will need home health PT OT, DME -Repeat pelvic x-ray today  showed stable left-sided pubic rami fracture,  Active Problems:    Essential hypertension -BP still elevated, possibly due to pain, continue chlorthalidone -Added Bystolic 5 mg yesterday, continue hydralazine IV as needed with parameters     Osteoporosis, vitamin D deficiency -Vitamin D level stable, 39.7     Liver cirrhosis secondary to NASH ([place]) -Follows GI outpatient (Dr. Hilarie Fredrickson), last visit on 02/01/2020 -Patient was placed on budesonide, will continue -No evidence of hepatic encephalopathy or ascites  History of duodenal adenoma -Status post endoscopic mucosal resection by Dr. Rush Landmark, will need endoscopy in 6 months  Obesity Estimated body mass index is 32.44 kg/m as calculated from the following:   Height as of this encounter: 5' 6"  (1.676 m).   Weight as of this encounter: 91.2 kg.  Code Status: Full code DVT Prophylaxis:  Lovenox  Family Communication: Discussed all imaging results, lab results, explained to the patient    Disposition Plan:     Status is: Inpatient  Remains inpatient appropriate because:Inpatient level of care appropriate due to severity of illness  Dispo: The patient is from: Home              Anticipated d/c is to: Home              Anticipated d/c date is: 2 days              Patient currently is not medically stable to d/c.  Still in significant pain, needing IV pain control, patient not feeling comfortable be discharged today.  Hopefully DC home with home health in a.m.   Time Spent in minutes 25 minutes  Procedures:  None  Consultants:   Orthopedics  Antimicrobials:   Anti-infectives (From admission, onward)   None         Medications  Scheduled Meds: . aspirin EC  81 mg Oral Daily  . budesonide  6 mg Oral Daily  . chlorthalidone  25 mg Oral Daily  . cholecalciferol  1,000 Units Oral Daily  . docusate sodium  100 mg Oral BID  . enoxaparin (LOVENOX) injection  40 mg Subcutaneous Q24H  . gabapentin  100 mg Oral  TID  . [START ON 03/06/2020] influenza vaccine adjuvanted  0.5 mL Intramuscular Once  . ketorolac  15 mg Intravenous Q6H  . lidocaine  1 patch Transdermal Q24H  . nebivolol  5 mg Oral Daily  . [START ON 03/06/2020] pneumococcal 23 valent vaccine  0.5 mL Intramuscular Once  . polyethylene glycol  17 g Oral Daily   Continuous Infusions: PRN Meds:.hydrALAZINE, HYDROmorphone (DILAUDID) injection, loratadine, naphazoline-glycerin, ondansetron **OR** ondansetron (ZOFRAN) IV, traMADol      Subjective:   Melanny Wire was seen and examined today.  Patient sitting up in the chair, stated pain in the left pelvic region and hip 8/10, constant, worse with movement.  Concerned about going home with pain.  Patient denies dizziness, chest pain, shortness of breath, abdominal pain, N/V/D/C.  Afebrile  Objective:   Vitals:   03/04/20 1940 03/04/20 2300 03/05/20 0433 03/05/20 1300  BP: (!) 169/68 (!) 157/62 (!) 156/61 (!) 166/68  Pulse: 64  63 (!) 56  Resp: 18  18   Temp: 99.5 F (37.5 C)  99.4 F (37.4 C) 98 F (36.7 C)  TempSrc: Oral  Oral Oral  SpO2: 94%  95% 98%  Weight:      Height:        Intake/Output Summary (Last 24 hours) at 03/05/2020 1351 Last data filed at 03/05/2020 1030 Gross per 24 hour  Intake 359 ml  Output 100 ml  Net 259 ml     Wt Readings from Last 3 Encounters:  03/03/20 91.2 kg  02/01/20 91.1 kg  01/16/20 89.8 kg   Physical Exam  General: Alert and oriented x 3, NAD, uncomfortable  Cardiovascular: S1 S2 clear, RRR. No pedal edema b/l  Respiratory: CTAB, no wheezing, rales or rhonchi  Gastrointestinal: Soft, nontender, nondistended, NBS  Ext: no pedal edema bilaterally  Neuro: no new deficits  Musculoskeletal: No cyanosis, clubbing  Skin: No rashes  Psych: Normal affect and demeanor, alert and oriented x3      Data Reviewed:  I have personally reviewed following labs and imaging studies  Micro Results Recent Results (from the past 240  hour(s))  Respiratory Panel by RT PCR (Flu A&B, Covid) - Nasopharyngeal Swab     Status: None   Collection Time: 03/03/20 12:30 PM   Specimen: Nasopharyngeal Swab  Result Value Ref Range Status   SARS Coronavirus 2 by RT PCR NEGATIVE NEGATIVE Final  Comment: (NOTE) SARS-CoV-2 target nucleic acids are NOT DETECTED.  The SARS-CoV-2 RNA is generally detectable in upper respiratoy specimens during the acute phase of infection. The lowest concentration of SARS-CoV-2 viral copies this assay can detect is 131 copies/mL. A negative result does not preclude SARS-Cov-2 infection and should not be used as the sole basis for treatment or other patient management decisions. A negative result may occur with  improper specimen collection/handling, submission of specimen other than nasopharyngeal swab, presence of viral mutation(s) within the areas targeted by this assay, and inadequate number of viral copies (<131 copies/mL). A negative result must be combined with clinical observations, patient history, and epidemiological information. The expected result is Negative.  Fact Sheet for Patients:  PinkCheek.be  Fact Sheet for Healthcare Providers:  GravelBags.it  This test is no t yet approved or cleared by the [place] FDA and  has been authorized for detection and/or diagnosis of SARS-CoV-2 by FDA under an Emergency Use Authorization (EUA). This EUA will remain  in effect (meaning this test can be used) for the duration of the COVID-19 declaration under Section 564(b)(1) of the Act, 21 U.S.C. section 360bbb-3(b)(1), unless the authorization is terminated or revoked sooner.     Influenza A by PCR NEGATIVE NEGATIVE Final   Influenza B by PCR NEGATIVE NEGATIVE Final    Comment: (NOTE) The Xpert Xpress SARS-CoV-2/FLU/RSV assay is intended as an aid in  the diagnosis of influenza from Nasopharyngeal swab specimens and  should not  be used as a sole basis for treatment. Nasal washings and  aspirates are unacceptable for Xpert Xpress SARS-CoV-2/FLU/RSV  testing.  Fact Sheet for Patients: PinkCheek.be  Fact Sheet for Healthcare Providers: GravelBags.it  This test is not yet approved or cleared by the [place] FDA and  has been authorized for detection and/or diagnosis of SARS-CoV-2 by  FDA under an Emergency Use Authorization (EUA). This EUA will remain  in effect (meaning this test can be used) for the duration of the  Covid-19 declaration under Section 564(b)(1) of the Act, 21  U.S.C. section 360bbb-3(b)(1), unless the authorization is  terminated or revoked. Performed at [facility], [place] [address] [place], [place] [address]     Radiology Reports DG Pelvis Comp Min 3V  Result Date: 03/05/2020 CLINICAL DATA:  Pelvic fractures. EXAM: JUDET PELVIS - 3+ VIEW COMPARISON:  Radiographs 03/03/2020 FINDINGS: Stable left-sided pubic rami fractures. The pubic symphysis and SI joints are intact. Both hips are normally located. Severe right and moderate left hip joint degenerative changes. No hip fracture. IMPRESSION: Stable left-sided pubic rami fractures. Electronically Signed   By: Marijo Sanes M.D.   On: 03/05/2020 12:40   DG Hip Unilat With Pelvis 2-3 Views Left  Result Date: 03/03/2020 CLINICAL DATA:  Pain.  Fall. EXAM: DG HIP (WITH OR WITHOUT PELVIS) 2-3V LEFT COMPARISON:  02/27/2021w FINDINGS: Acute mildly displaced fractures of left superior and inferior pubic rami. No evidence of hip fracture. Hips are located. Similar severe right hip degenerative change with severe joint space narrowing, osteophytes, and juxta-articular sclerosis. Mild to moderate left hip degenerative change. Osteopenia. Atherosclerotic calcifications. IMPRESSION: 1. Acute mildly displaced fractures of left superior and inferior pubic rami. 2. Similar severe right and  mild-to-moderate left hip degenerative change. Electronically Signed   By: Margaretha Sheffield MD   On: 03/03/2020 09:02    Lab Data:  CBC: Recent Labs  Lab 03/03/20 1225 03/04/20 0551  WBC 6.3 4.9  HGB 12.6 12.1  HCT 38.0 36.4  MCV 97.9 98.4  PLT 174 681   Basic Metabolic Panel: Recent Labs  Lab 03/02/20 0907 03/03/20 1225  NA 137 134*  K 4.1 3.6  CL 103 100  CO2 24 22  GLUCOSE 118* 114*  BUN 16 10  CREATININE 0.53 0.48  CALCIUM 9.7 9.6   GFR: Estimated Creatinine Clearance: 79.8 mL/min (by C-G formula based on SCr of 0.48 mg/dL). Liver Function Tests: Recent Labs  Lab 03/02/20 0907  AST 33  ALT 37*  ALKPHOS 107  BILITOT 0.8  PROT 8.3  ALBUMIN 4.7   No results for input(s): LIPASE, AMYLASE in the last 168 hours. No results for input(s): AMMONIA in the last 168 hours. Coagulation Profile: No results for input(s): INR, PROTIME in the last 168 hours. Cardiac Enzymes: No results for input(s): CKTOTAL, CKMB, CKMBINDEX, TROPONINI in the last 168 hours. BNP (last 3 results) No results for input(s): PROBNP in the last 8760 hours. HbA1C: No results for input(s): HGBA1C in the last 72 hours. CBG: No results for input(s): GLUCAP in the last 168 hours. Lipid Profile: No results for input(s): CHOL, HDL, LDLCALC, TRIG, CHOLHDL, LDLDIRECT in the last 72 hours. Thyroid Function Tests: No results for input(s): TSH, T4TOTAL, FREET4, T3FREE, THYROIDAB in the last 72 hours. Anemia Panel: No results for input(s): VITAMINB12, FOLATE, FERRITIN, TIBC, IRON, RETICCTPCT in the last 72 hours. Urine analysis:    Component Value Date/Time   COLORURINE YELLOW 04/16/2017 1532   APPEARANCEUR Sl Cloud[place] (A) 04/16/2017 1532   LABSPEC >=1.030 (A) 04/16/2017 1532   PHURINE 6.0 04/16/2017 1532   GLUCOSEU NEGATIVE 04/16/2017 1532   HGBUR NEGATIVE 04/16/2017 1532   BILIRUBINUR NEGATIVE 04/16/2017 1532   KETONESUR NEGATIVE 04/16/2017 1532   UROBILINOGEN 0.2 04/16/2017 1532   NITRITE  NEGATIVE 04/16/2017 1532   LEUKOCYTESUR NEGATIVE 04/16/2017 1532     Pearley Baranek M.D. [facility] 03/05/2020, 1:51 PM   Call night coverage person covering after 7pm

## 2020-03-05 NOTE — Progress Notes (Signed)
Physical Therapy Treatment Patient Details Name: Kimberly Pierce MRN: [id number] DOB: 08/04/1955 Today's Date: 03/05/2020    History of Present Illness Pt is a 63 y.o. F with significant PMH of osteoporosis, liver cirrhosis, tobacco use, compression fracture of lumbar vertebrae who presents after a fall with acute mildly displaced fractures of left superior and inferior pubic rami.     PT Comments    Pt progressing towards goals, ambulated 21' with RW and supervision with slow gait, worked on increasing step length to foot distance. Expect pt will need RW at least 2 weeks before progressing off of it and will then need to work more on balance and continued strengthening. Discussed this with her in relation to return to a physically demanding job. Pt able to come to edge of flat bed with independence and increased time. Pt with good motivation to progress. PT will continue to follow.     Follow Up Recommendations  Home health PT;Supervision/Assistance - 24 hour     Equipment Recommendations  Rolling walker with 5" wheels;3in1 (PT)    Recommendations for Other Services       Precautions / Restrictions Precautions Precautions: Fall Restrictions Weight Bearing Restrictions: Yes RLE Weight Bearing: Weight bearing as tolerated LLE Weight Bearing: Weight bearing as tolerated    Mobility  Bed Mobility Overal bed mobility: Needs Assistance Bed Mobility: Supine to Sit;Sit to Supine     Supine to sit: Supervision Sit to supine: Min assist   General bed mobility comments: increased time needed to come to EOB from flat bed but no physical assist given, vc's to roll to R first when pt could not sit straight up. Min A to LE's for return to supine  Transfers Overall transfer level: Needs assistance Equipment used: Rolling walker (2 wheeled) Transfers: Sit to/from Stand Sit to Stand: Supervision         General transfer comment: supervision with vc's for hand  placement  Ambulation/Gait Ambulation/Gait assistance: Supervision Gait Distance (Feet): 50 Feet Assistive device: Rolling walker (2 wheeled) Gait Pattern/deviations: Step-to pattern;Decreased stance time - left;Decreased weight shift to left;Antalgic;Trunk flexed Gait velocity: decreased Gait velocity interpretation: <1.31 ft/sec, indicative of household ambulator General Gait Details: worked on taking wt through [place] when stepping R foot to decreased L sided pain. Pt did well with this. Also worked on pt increasing step length to just length of her other foot as she was stepping only 1/2 foot length. Pt did well with this also but fatigues quickly    Stairs             Wheelchair Mobility    Modified Rankin (Stroke Patients Only)       Balance Overall balance assessment: Needs assistance Sitting-balance support: Feet supported Sitting balance-Leahy Scale: Good     Standing balance support: Bilateral upper extremity supported Standing balance-Leahy Scale: Poor Standing balance comment: can stand briefly without support but needs UE support for all dynamic activity                            Cognition Arousal/Alertness: Awake/alert Behavior During Therapy: WFL for tasks assessed/performed Overall Cognitive Status: Within Functional Limits for tasks assessed                                 General Comments: asking appropriate questions about returning to [facility] [employer] [employer] [facility]      Exercises Total Joint Exercises  Bridges: AROM;5 reps;Supine General Exercises - Lower Extremity Ankle Circles/Pumps: AROM;Both;10 reps;Seated Long Arc Quad: Both;10 reps;Seated    General Comments General comments (skin integrity, edema, etc.): pt asking when she will be able to safely return to work. Discussed that at this point it looks like she will need RW for at least 2 weeks to manage pain and increase safety. Will then need some time after that to improve balance and  strengthen before she return to physical activity involved with her job      Pertinent Vitals/Pain Pain Assessment: Faces Faces Pain Scale: Hurts little more Pain Location: L hip Pain Descriptors / Indicators: Aching;Grimacing;Guarding Pain Intervention(s): Limited activity within patient's tolerance;Monitored during session    Home Living                      Prior Function            PT Goals (current goals can now be found in the care plan section) Acute Rehab PT Goals Patient Stated Goal: return to work PT Goal Formulation: With patient Time For Goal Achievement: 03/17/20 Potential to Achieve Goals: Good Progress towards PT goals: Progressing toward goals    Frequency    Min 5X/week      PT Plan Current plan remains appropriate    Co-evaluation              AM-PAC PT "6 Clicks" Mobility   Outcome Measure  Help needed turning from your back to your side while in a flat bed without using bedrails?: None Help needed moving from lying on your back to sitting on the side of a flat bed without using bedrails?: None Help needed moving to and from a bed to a chair (including a wheelchair)?: A Little Help needed standing up from a chair using your arms (e.g., wheelchair or bedside chair)?: A Little Help needed to walk in hospital room?: A Little Help needed climbing 3-5 steps with a railing? : A Lot 6 Click Score: 19    End of Session Equipment Utilized During Treatment: Gait belt Activity Tolerance: Patient tolerated treatment well Patient left: in bed;with call bell/phone within reach (transport arrived to go to xray) Nurse Communication: Mobility status PT Visit Diagnosis: Pain;Difficulty in walking, not elsewhere classified (R26.2);Other abnormalities of gait and mobility (R26.89) Pain - Right/Left: Left Pain - part of body: Hip     Time: 1113-1140 PT Time Calculation (min) (ACUTE ONLY): 27 min  Charges:  $Gait Training: 8-22  mins $Therapeutic Exercise: 8-22 mins                     Leighton Roach, [place]  Pager [phone] Office [place] 03/05/2020, 12:11 PM

## 2020-03-06 ENCOUNTER — Other Ambulatory Visit (HOSPITAL_COMMUNITY): Payer: Self-pay | Admitting: Internal Medicine

## 2020-03-06 LAB — CBC
HCT: 36 % (ref 36.0–46.0)
Hemoglobin: 12 g/dL (ref 12.0–15.0)
MCH: 32.4 pg (ref 26.0–34.0)
MCHC: 33.3 g/dL (ref 30.0–36.0)
MCV: 97.3 fL (ref 80.0–100.0)
Platelets: 172 10*3/uL (ref 150–400)
RBC: 3.7 MIL/uL — ABNORMAL LOW (ref 3.87–5.11)
RDW: 12.3 % (ref 11.5–15.5)
WBC: 6.2 10*3/uL (ref 4.0–10.5)
nRBC: 0 % (ref 0.0–0.2)

## 2020-03-06 LAB — BASIC METABOLIC PANEL
Anion gap: 11 (ref 5–15)
BUN: 24 mg/dL — ABNORMAL HIGH (ref 8–23)
CO2: 24 mmol/L (ref 22–32)
Calcium: 9.9 mg/dL (ref 8.9–10.3)
Chloride: 99 mmol/L (ref 98–111)
Creatinine, Ser: 0.68 mg/dL (ref 0.44–1.00)
GFR calc Af Amer: >60 mL/min (ref >60)
GFR calc non Af Amer: >60 mL/min (ref >60)
Glucose, Bld: 137 mg/dL — ABNORMAL HIGH (ref 70–99)
Potassium: 3.7 mmol/L (ref 3.5–5.1)
Sodium: 134 mmol/L — ABNORMAL LOW (ref 135–145)

## 2020-03-06 MED ORDER — TRAMADOL HCL 50 MG PO TABS
50.0000 mg | ORAL_TABLET | Freq: Four times a day (QID) | ORAL | 0 refills | Status: DC | PRN
Start: 2020-03-06 — End: 2021-06-26

## 2020-03-06 MED ORDER — LIDOCAINE 5 % EX PTCH: 1.0000 | MEDICATED_PATCH | CUTANEOUS | 0 refills | Status: DC

## 2020-03-06 MED ORDER — POLYETHYLENE GLYCOL 3350 17 G PO PACK: 17.0000 g | PACK | Freq: Every day | ORAL | 0 refills | Status: DC | PRN

## 2020-03-06 MED ORDER — NEBIVOLOL HCL 5 MG PO TABS: 5.0000 mg | ORAL_TABLET | Freq: Every day | ORAL | 3 refills | Status: DC

## 2020-03-06 MED FILL — traMADol HCL 50 MG TABS: 50 | 7 days supply | Qty: 30 | Fill #0

## 2020-03-06 MED FILL — LIDOCAINE 5 % PTCH: 5 | 30 days supply | Qty: 30 | Fill #0

## 2020-03-06 MED FILL — BYSTOLIC 5 MG TABLET: 5 | 30 days supply | Qty: 30 | Fill #0

## 2020-03-06 NOTE — Discharge Summary (Signed)
Physician Discharge Summary   Patient ID: Kimberly Pierce MRN: [id number] DOB/AGE: [birth year] 64 y.o.  Admit date: 03/03/2020 Discharge date: 03/06/2020  Primary Care Physician:  Janith Lima, MD   Recommendations for Outpatient Follow-up:  1. Follow up with PCP in 1-2 weeks 2. Patient recommended to follow-up with Dr. Marcelino Scot in 2 weeks 3. Resumed Bystolic 5 mg daily  Home Health: Home health PT OT Equipment/Devices: DME 3n 1, rolling walker, tub bench  Discharge Condition: stable  CODE STATUS: FULL  Diet recommendation: Heart healthy diet   Discharge Diagnoses:    Marland Kitchen Mildly displaced pelvic fracture on the left  ([place]) . Hyperlipidemia with target LDL less than 130 . Osteoporosis . Liver cirrhosis secondary to NASH ([place]) . Vitamin D deficiency . TOBACCO USE . Essential hypertension, not well controlled   Consults: Orthopedics, Dr. Marcelino Scot    Allergies:   Allergies  Allergen Reactions  . Percocet [Oxycodone-Acetaminophen] Rash    Rash with Percocet Rxed in ER 08/28/14      DISCHARGE MEDICATIONS: Allergies as of 03/06/2020      Reactions   Percocet [oxycodone-acetaminophen] Rash   Rash with Percocet Rxed in ER 08/28/14       Medication List    STOP taking these medications   Pfizer-BioNTech COVID-19 Vacc 30 MCG/0.3ML injection Generic drug: COVID-19 mRNA vaccine (Pfizer)     TAKE these medications   aspirin EC 81 MG tablet Take 81 mg by mouth daily.   budesonide 3 MG 24 hr capsule Commonly known as: ENTOCORT EC Take 2 capsules (6 mg total) by mouth daily.   chlorthalidone 25 MG tablet Commonly known as: HYGROTON Take 1 tablet (25 mg total) by mouth daily.   Cholecalciferol 25 MCG (1000 UT) tablet Take 1,000 Units by mouth daily.   Claritin 10 MG tablet Generic drug: loratadine Take 10 mg by mouth daily as needed for allergies.   FISH OIL PO Take 1 capsule by mouth daily.   gabapentin 100 MG capsule Commonly known as: NEURONTIN Take 1-3  capsules (100-300 mg total) by mouth 3 (three) times daily as needed (nerve pain).   ibuprofen 200 MG tablet Commonly known as: ADVIL Take 200-800 mg by mouth every 6 (six) hours as needed for moderate pain.   lidocaine 5 % Commonly known as: LIDODERM Place 1 patch onto the skin daily. Remove & Discard patch within 12 hours or as directed by MD. Apply to L hip   nebivolol 5 MG tablet Commonly known as: Bystolic Take 1 tablet (5 mg total) by mouth daily.   polyethylene glycol 17 g packet Commonly known as: MIRALAX / GLYCOLAX Take 17 g by mouth daily as needed for mild constipation or moderate constipation (also available OTC).   tetrahydrozoline 0.05 % ophthalmic solution Place 1 drop into both eyes 4 (four) times daily as needed (dry eyes, itchy eyes).   traMADol 50 MG tablet Commonly known as: ULTRAM Take 1 tablet (50 mg total) by mouth every 6 (six) hours as needed for severe pain. What changed:   how much to take  how to take this  when to take this  reasons to take this  additional instructions   TURMERIC PO Take 1 capsule by mouth daily.            Durable Medical Equipment  (From admission, onward)         Start     Ordered   03/06/20 0845  For home use only DME Walker rolling  Once  Question Answer Comment  Walker: With 5 Inch Wheels   Patient needs a walker to treat with the following condition Weakness      03/06/20 0844   03/04/20 1002  For home use only DME Tub bench  Once        03/04/20 1001   03/04/20 1001  For home use only DME 3 n 1  Once        03/04/20 1001           Brief H and P: For complete details please refer to admission H and P, but in brief Patient is a 64 year old female with history of hypertension, autoimmune hepatitis and cirrhosis on Entocort, lumbar spine osteoarthritis, hyperlipidemia presented with mechanical fall and hip pain.  Patient woke up to use the bathroom, slipped and fell onto the bathroom floor.   Patient stated that the floor was "slick".  She got up in the morning and was unable to walk due to hip pain.   Pain was constant, worse with palpation and movement.  Denied any loss of consciousness, any chest pain or shortness of breath, lightheadedness or palpitations prior to the fall.  Did not hit her head. X-ray showed acute mildly displaced fractures of the left superior and inferior pubic rami. Severe right and mild to moderate left hip degenerative change.  Orthopedics was consulted   Hospital Course:   Mildly displaced pelvic fracture on the left [facility]) -Orthopedics was consulted, recommended PT OT , pain control and office follow-up in next week -Patient underwent physical therapy occupational therapy during the hospitalization.   She required IV pain medications during the hospitalization for the pain  --Continue tramadol 50 mg every 6 hours as needed for pain -Home health PT OT, DME rolling walker, tub bench, 3 n 1 was ordered -Repeat pelvic x-ray today showed stable left-sided pubic rami fracture,      Essential hypertension -BP was elevated during hospitalization, continue chlorthalidone Patient was recommended to resume Bystolic which she had not been taking at home     Osteoporosis, vitamin D deficiency -Vitamin D level stable, 39.7     Liver cirrhosis secondary to NASH ([place]) -Follows GI outpatient (Dr. Hilarie Fredrickson), last visit on 02/01/2020 -Patient was placed on budesonide, will continue -No evidence of hepatic encephalopathy or ascites  History of duodenal adenoma -Status post endoscopic mucosal resection by Dr. Rush Landmark, will need endoscopy in 6 months  Obesity Estimated body mass index is 32.44 kg/m as calculated from the following:   Height as of this encounter: 5' 6"  (1.676 m).   Weight as of this encounter: 91.2 kg.  Day of Discharge S: pain much better controlled today, worked with PT, feels she is better to go home today  BP (!) 144/64 (BP  Location: Right Arm)   Pulse (!) 57   Temp 98.4 F (36.9 C) (Oral)   Resp 16   Ht 5' 6"  (1.676 m)   Wt 91.2 kg   SpO2 99%   BMI 32.44 kg/m   Physical Exam: General: Alert and awake oriented x3 not in any acute distress. HEENT: anicteric sclera, pupils reactive to light and accommodation CVS: S1-S2 clear no murmur rubs or gallops Chest: clear to auscultation bilaterally, no wheezing rales or rhonchi Abdomen: soft nontender, nondistended, normal bowel sounds Extremities: no cyanosis, clubbing or edema noted bilaterally Neuro: Cranial nerves II-XII intact, no focal neurological deficits    Get Medicines reviewed and adjusted: Please take all your medications with you for your next visit  with your Primary MD  Please request your Primary MD to go over all hospital tests and procedure/radiological results at the follow up. Please ask your Primary MD to get all Hospital records sent to his/her office.  If you experience worsening of your admission symptoms, develop shortness of breath, life threatening emergency, suicidal or homicidal thoughts you must seek medical attention immediately by calling 911 or calling your MD immediately  if symptoms less severe.  You must read complete instructions/literature along with all the possible adverse reactions/side effects for all the Medicines you take and that have been prescribed to you. Take any new Medicines after you have completely understood and accept all the possible adverse reactions/side effects.   Do not drive when taking pain medications.   Do not take more than prescribed Pain, Sleep and Anxiety Medications  Special Instructions: If you have smoked or chewed Tobacco  in the last 2 yrs please stop smoking, stop any regular Alcohol  and or any Recreational drug use.  Wear Seat belts while driving.  Please note  You were cared for by a hospitalist during your hospital stay. Once you are discharged, your primary care physician will  handle any further medical issues. Please note that NO REFILLS for any discharge medications will be authorized once you are discharged, as it is imperative that you return to your primary care physician (or establish a relationship with a primary care physician if you do not have one) for your aftercare needs so that they can reassess your need for medications and monitor your lab values.   The results of significant diagnostics from this hospitalization (including imaging, microbiology, ancillary and laboratory) are listed below for reference.      Procedures/Studies:  DG Pelvis Comp Min 3V  Result Date: 03/05/2020 CLINICAL DATA:  Pelvic fractures. EXAM: JUDET PELVIS - 3+ VIEW COMPARISON:  Radiographs 03/03/2020 FINDINGS: Stable left-sided pubic rami fractures. The pubic symphysis and SI joints are intact. Both hips are normally located. Severe right and moderate left hip joint degenerative changes. No hip fracture. IMPRESSION: Stable left-sided pubic rami fractures. Electronically Signed   By: Marijo Sanes M.D.   On: 03/05/2020 12:40   DG Hip Unilat With Pelvis 2-3 Views Left  Result Date: 03/03/2020 CLINICAL DATA:  Pain.  Fall. EXAM: DG HIP (WITH OR WITHOUT PELVIS) 2-3V LEFT COMPARISON:  02/27/2021w FINDINGS: Acute mildly displaced fractures of left superior and inferior pubic rami. No evidence of hip fracture. Hips are located. Similar severe right hip degenerative change with severe joint space narrowing, osteophytes, and juxta-articular sclerosis. Mild to moderate left hip degenerative change. Osteopenia. Atherosclerotic calcifications. IMPRESSION: 1. Acute mildly displaced fractures of left superior and inferior pubic rami. 2. Similar severe right and mild-to-moderate left hip degenerative change. Electronically Signed   By: Margaretha Sheffield MD   On: 03/03/2020 09:02      LAB RESULTS: Basic Metabolic Panel: Recent Labs  Lab 03/03/20 1225 03/06/20 0207  NA 134* 134*  K 3.6 3.7   CL 100 99  CO2 22 24  GLUCOSE 114* 137*  BUN 10 24*  CREATININE 0.48 0.68  CALCIUM 9.6 9.9   Liver Function Tests: Recent Labs  Lab 03/02/20 0907  AST 33  ALT 37*  ALKPHOS 107  BILITOT 0.8  PROT 8.3  ALBUMIN 4.7   No results for input(s): LIPASE, AMYLASE in the last 168 hours. No results for input(s): AMMONIA in the last 168 hours. CBC: Recent Labs  Lab 03/04/20 0551 03/04/20 0551 03/06/20 0207  WBC 4.9  --  6.2  HGB 12.1  --  12.0  HCT 36.4  --  36.0  MCV 98.4   < > 97.3  PLT 154  --  172   < > = values in this interval not displayed.   Cardiac Enzymes: No results for input(s): CKTOTAL, CKMB, CKMBINDEX, TROPONINI in the last 168 hours. BNP: Invalid input(s): POCBNP CBG: No results for input(s): GLUCAP in the last 168 hours.     Disposition and Follow-up: Discharge Instructions    Diet - low sodium heart healthy   Complete by: As directed    Increase activity slowly   Complete by: As directed        DISPOSITION: Home with home health   Cuthbert, Michael, MD. Schedule an appointment as soon as possible for a visit in 1 week(s).   Specialty: Orthopedic Surgery Contact information: [place] [address] [phone]        Janith Lima, MD Follow up in 2 week(s).   Specialty: Internal Medicine Contact information: [place] [address] [phone]        Care, [facility] Follow up.   Specialty: Home Health Services Contact information: [place] [address] [phone]                Time coordinating discharge:  35 minutes  Signed:   Estill Cotta M.D. [facility] 03/06/2020, 10:59 AM

## 2020-03-06 NOTE — Consult Note (Signed)
   [facility] Inpatient Consult   03/06/2020  Kimberly Pierce April 12, 1956 [id number]   [place] [facility] [ACO] Patient:  Kimberly Pierce  Plan: patient to be assigned to [place] Coordinator for disease management and community resource support.    Patient will receive a post hospital call and will be evaluated for assessments and disease process education.      Patient has already transitioned home with Home Health noted.  Plan: Patient will be followed by [place] Coordinator.   For additional questions or referrals please contact:   Natividad Brood, RN BSN [facility] Liaison  [phone] business mobile phone Toll free office [phone]  Fax number: [phone] [email or web address]

## 2020-03-06 NOTE — TOC CAGE-AID Note (Signed)
Transition of Care [facility]) - CAGE-AID Screening   Patient Details  Name: Kimberly Pierce MRN: [id number] Date of Birth: 01-27-1956  Transition of Care [facility]) CM/SW Contact:    Emeterio Reeve, [place] Phone Number: 03/06/2020, 11:28 AM   Clinical Narrative:  CSW met with pt at bedside. CSW introduced self and explained her role at the hospital.  PT denies alcohol use and substance use. Pt did not need any resources at this time.    CAGE-AID Screening:    Have You Ever Felt You Ought to Cut Down on Your Drinking or Drug Use?: No Have People Annoyed You By Critizing Your Drinking Or Drug Use?: No Have You Felt Bad Or Guilty About Your Drinking Or Drug Use?: No Have You Ever Had a Drink or Used Drugs First Thing In The Morning to Steady Your Nerves or to Get Rid of a Hangover?: No CAGE-AID Score: 0  Substance Abuse Education Offered: Yes    Blima Ledger, [place] Social Worker [phone]

## 2020-03-06 NOTE — TOC Initial Note (Addendum)
Transition of Care [facility]) - Initial/Assessment Note    Patient Details  Name: Kimberly Pierce MRN: [id number] Date of Birth: 1955/10/29  Transition of Care [facility]) CM/SW Contact:    Marilu Favre, RN Phone Number: 03/06/2020, 8:50 AM  Clinical Narrative:                  Patient from home with friend and son. Confirmed face sheet information.   Provided Medicare.gov list of home health agencies. Patient has no preference. Called left message with Tommi Rumps with [facility] awaiting call back.   Patient has no DME at home. Agreeable to walker, 3 in 1 and tub bench, all ordered with Freda Munro with [facility].  Tommi Rumps with [facility] has accepted referral for home health. Patient and nurse aware. Expected Discharge Plan: [place]     Patient Goals and CMS Choice Patient states their goals for this hospitalization and ongoing recovery are:: to return to home CMS Medicare.gov Compare Post Acute Care list provided to:: Patient Choice offered to / list presented to : Patient  Expected Discharge Plan and Services Expected Discharge Plan: [place]   Discharge Planning Services: CM Consult Post Acute Care Choice: Home Health, Durable Medical Equipment Living arrangements for the past 2 months: Apartment Expected Discharge Date: 03/06/20               DME Arranged: 3-N-1, Tub bench, Walker rolling DME Agency: [employer] Date DME Agency Contacted: 03/06/20 Time DME Agency Contacted: [phone] Representative spoke with at DME Agency: Freda Munro HH Arranged: PT, [place] Agency: [place] Date [place]: 03/06/20 Time [place]: [phone] Representative spoke with at [place]: Tommi Rumps left message  Prior Living Arrangements/Services Living arrangements for the past 2 months: Apartment Lives with:: Other (Comment) (Friend and son) Patient language and need for interpreter reviewed:: Yes Do you feel safe going back to the place where you live?:  Yes      Need for Family Participation in Patient Care: Yes (Comment) Care giver support system in place?: Yes (comment)   Criminal Activity/Legal Involvement Pertinent to Current Situation/Hospitalization: No - Comment as needed  Activities of Daily Living Home Assistive Devices/Equipment: Eyeglasses ADL Screening (condition at time of admission) Patient's cognitive ability adequate to safely complete daily activities?: Yes Is the patient deaf or have difficulty hearing?: No Does the patient have difficulty seeing, even when wearing glasses/contacts?: No Does the patient have difficulty concentrating, remembering, or making decisions?: No Patient able to express need for assistance with ADLs?: Yes Does the patient have difficulty dressing or bathing?: No Independently performs ADLs?: Yes (appropriate for developmental age) Does the patient have difficulty walking or climbing stairs?: Yes Weakness of Legs: Both Weakness of Arms/Hands: None  Permission Sought/Granted   Permission granted to share information with : No              Emotional Assessment Appearance:: Appears stated age Attitude/Demeanor/Rapport: Engaged Affect (typically observed): Accepting Orientation: : Oriented to Self, Oriented to Place, Oriented to  Time, Oriented to Situation Alcohol / Substance Use: Not Applicable Psych Involvement: No (comment)  Admission diagnosis:  Pelvic fracture ([place]) [S32.9XXA] Fall, initial encounter [id number]] Closed displaced fracture of left pubis, initial encounter [facility]) [S32.502A] Patient Active Problem List   Diagnosis Date Noted  . Pelvic fracture ([place]) 03/03/2020  . Liver cirrhosis secondary to NASH ([place] 04/06/2019  . Cervical cancer screening 05/04/2017  . Elevated ferritin 05/08/2016  . Routine general  medical examination at a health care facility 05/07/2016  . Cervical radiculitis 10/04/2014  . Degeneration of meniscus of right knee 09/20/2014  . Systolic murmur  03/97/9536  . Primary osteoarthritis of both knees 09/11/2014  . Vitamin D deficiency 03/24/2013  . Hyperlipidemia with target LDL less than 130 03/23/2013  . Hyperglycemia 03/23/2013  . Fracture of lumbar vertebra, compression ([place]) 05/05/2011  . Osteoporosis 05/05/2011  . TOBACCO USE 07/31/2010  . Essential hypertension 07/31/2010   PCP:  Janith Lima, MD Pharmacy:   [place], [place] - [address] [place] [address] Phone: [phone] Fax: [phone]  [facility] [phone] - [place], [place] - [address] [place] [address] Phone: [phone] Fax: [phone]     Social Determinants of Health (SDOH) Interventions    Readmission Risk Interventions No flowsheet data found.

## 2020-03-06 NOTE — Progress Notes (Signed)
Discharge instructions reviewed with patient at bedside.  Patient verbalized understanding and declined further instructions.  Patient transported off unit for discharge via wheelchair and staff supervision.

## 2020-03-07 ENCOUNTER — Other Ambulatory Visit: Payer: Self-pay | Admitting: *Deleted

## 2020-03-07 NOTE — Patient Outreach (Signed)
[place] [facility]) Care Management  03/07/2020  SWETHA RAYLE [birth year] [id number]  COVERING FOR Rozetta Nunnery  Transition of care call/case closure   Referral received: 03/06/2020 Initial outreach: 03/07/2020    Subjective: Initial successful telephone call to patient's preferred number in order to complete transition of care assessment; 2 HIPAA identifiers verified. Explained purpose of call and completed transition of care assessment.  States she is doing well, denies post-operative problems, states surgical pain well managed with prescribed medications, tolerating diet, denies bowel or bladder problems.  Family available to assist with her recovery.  She denies any ongoing health issues and says she does not need a referral to one of the [place] chronic disease management programs.  She denies educational needs related to staying safe during the COVID 19 pandemic.    Objective:  Mrs. Shiflett was hospitalized at [facility]. She was discharged to home on 03/06/2020 with the need for home health services [facility]).   Assessment:  Patient voices good understanding of all discharge instructions.  See transition of care flowsheet for assessment details.   Plan:  Reviewed hospital discharge diagnosis of Closed displaced fracture left pubis and treatment plan using hospital discharge instructions, assessing medication adherence, reviewing postoperative problems requiring provider notification, and discussing the importance of follow up with surgeon, primary care provider and specialists as directed.  No ongoing care management needs identified so will close case to [place] Management services and route successful outreach letter with [place] Management pamphlet and 24 Hour Nurse Line Magnet to [place] Management clinical pool to be mailed to patient's home address.   Raina Mina, RN Care Management  Coordinator [place] Office [phone]

## 2020-03-09 DIAGNOSIS — E785 Hyperlipidemia, unspecified: Secondary | ICD-10-CM | POA: Diagnosis not present

## 2020-03-09 DIAGNOSIS — M47816 Spondylosis without myelopathy or radiculopathy, lumbar region: Secondary | ICD-10-CM | POA: Diagnosis not present

## 2020-03-09 DIAGNOSIS — S32592D Other specified fracture of left pubis, subsequent encounter for fracture with routine healing: Secondary | ICD-10-CM | POA: Diagnosis not present

## 2020-03-09 DIAGNOSIS — M810 Age-related osteoporosis without current pathological fracture: Secondary | ICD-10-CM | POA: Diagnosis not present

## 2020-03-09 DIAGNOSIS — I10 Essential (primary) hypertension: Secondary | ICD-10-CM | POA: Diagnosis not present

## 2020-03-12 DIAGNOSIS — S32592D Other specified fracture of left pubis, subsequent encounter for fracture with routine healing: Secondary | ICD-10-CM | POA: Diagnosis not present

## 2020-03-12 DIAGNOSIS — M810 Age-related osteoporosis without current pathological fracture: Secondary | ICD-10-CM | POA: Diagnosis not present

## 2020-03-12 DIAGNOSIS — M47816 Spondylosis without myelopathy or radiculopathy, lumbar region: Secondary | ICD-10-CM | POA: Diagnosis not present

## 2020-03-12 DIAGNOSIS — I10 Essential (primary) hypertension: Secondary | ICD-10-CM | POA: Diagnosis not present

## 2020-03-12 DIAGNOSIS — E785 Hyperlipidemia, unspecified: Secondary | ICD-10-CM | POA: Diagnosis not present

## 2020-03-13 DIAGNOSIS — S32592D Other specified fracture of left pubis, subsequent encounter for fracture with routine healing: Secondary | ICD-10-CM | POA: Diagnosis not present

## 2020-03-13 DIAGNOSIS — E785 Hyperlipidemia, unspecified: Secondary | ICD-10-CM | POA: Diagnosis not present

## 2020-03-13 DIAGNOSIS — M47816 Spondylosis without myelopathy or radiculopathy, lumbar region: Secondary | ICD-10-CM | POA: Diagnosis not present

## 2020-03-13 DIAGNOSIS — M810 Age-related osteoporosis without current pathological fracture: Secondary | ICD-10-CM | POA: Diagnosis not present

## 2020-03-13 DIAGNOSIS — I10 Essential (primary) hypertension: Secondary | ICD-10-CM | POA: Diagnosis not present

## 2020-03-14 DIAGNOSIS — M810 Age-related osteoporosis without current pathological fracture: Secondary | ICD-10-CM | POA: Diagnosis not present

## 2020-03-14 DIAGNOSIS — M47816 Spondylosis without myelopathy or radiculopathy, lumbar region: Secondary | ICD-10-CM | POA: Diagnosis not present

## 2020-03-14 DIAGNOSIS — I10 Essential (primary) hypertension: Secondary | ICD-10-CM | POA: Diagnosis not present

## 2020-03-14 DIAGNOSIS — E785 Hyperlipidemia, unspecified: Secondary | ICD-10-CM | POA: Diagnosis not present

## 2020-03-14 DIAGNOSIS — S32592D Other specified fracture of left pubis, subsequent encounter for fracture with routine healing: Secondary | ICD-10-CM | POA: Diagnosis not present

## 2020-03-15 DIAGNOSIS — E785 Hyperlipidemia, unspecified: Secondary | ICD-10-CM | POA: Diagnosis not present

## 2020-03-15 DIAGNOSIS — M47816 Spondylosis without myelopathy or radiculopathy, lumbar region: Secondary | ICD-10-CM | POA: Diagnosis not present

## 2020-03-15 DIAGNOSIS — I10 Essential (primary) hypertension: Secondary | ICD-10-CM | POA: Diagnosis not present

## 2020-03-15 DIAGNOSIS — M810 Age-related osteoporosis without current pathological fracture: Secondary | ICD-10-CM | POA: Diagnosis not present

## 2020-03-15 DIAGNOSIS — S32592D Other specified fracture of left pubis, subsequent encounter for fracture with routine healing: Secondary | ICD-10-CM | POA: Diagnosis not present

## 2020-03-19 ENCOUNTER — Ambulatory Visit (INDEPENDENT_AMBULATORY_CARE_PROVIDER_SITE_OTHER): Payer: 59 | Admitting: Family Medicine

## 2020-03-19 ENCOUNTER — Other Ambulatory Visit: Payer: Self-pay

## 2020-03-19 ENCOUNTER — Encounter: Payer: Self-pay | Admitting: Family Medicine

## 2020-03-19 ENCOUNTER — Ambulatory Visit (INDEPENDENT_AMBULATORY_CARE_PROVIDER_SITE_OTHER): Payer: 59

## 2020-03-19 VITALS — BP 170/90 | HR 80 | Ht 66.0 in | Wt 198.0 lb

## 2020-03-19 DIAGNOSIS — S32592A Other specified fracture of left pubis, initial encounter for closed fracture: Secondary | ICD-10-CM

## 2020-03-19 DIAGNOSIS — S32511A Fracture of superior rim of right pubis, initial encounter for closed fracture: Secondary | ICD-10-CM | POA: Diagnosis not present

## 2020-03-19 DIAGNOSIS — M8080XA Other osteoporosis with current pathological fracture, unspecified site, initial encounter for fracture: Secondary | ICD-10-CM

## 2020-03-19 DIAGNOSIS — M859 Disorder of bone density and structure, unspecified: Secondary | ICD-10-CM

## 2020-03-19 DIAGNOSIS — M47816 Spondylosis without myelopathy or radiculopathy, lumbar region: Secondary | ICD-10-CM | POA: Diagnosis not present

## 2020-03-19 DIAGNOSIS — M8580 Other specified disorders of bone density and structure, unspecified site: Secondary | ICD-10-CM

## 2020-03-19 DIAGNOSIS — S32592D Other specified fracture of left pubis, subsequent encounter for fracture with routine healing: Secondary | ICD-10-CM | POA: Diagnosis not present

## 2020-03-19 DIAGNOSIS — I10 Essential (primary) hypertension: Secondary | ICD-10-CM | POA: Diagnosis not present

## 2020-03-19 DIAGNOSIS — M810 Age-related osteoporosis without current pathological fracture: Secondary | ICD-10-CM | POA: Diagnosis not present

## 2020-03-19 DIAGNOSIS — E785 Hyperlipidemia, unspecified: Secondary | ICD-10-CM | POA: Diagnosis not present

## 2020-03-19 NOTE — Patient Instructions (Signed)
Thank you for coming in today.  Continue home health PT.  Plan for bone density test soon.  Recheck with me in 2 weeks.   Increase vit D to 5000 units a day (5 of your 1000 unit pills).  You can get bigger pills at the pharmacy or online.   Continue walker.

## 2020-03-19 NOTE — Progress Notes (Signed)
CHRISY HILLEBRAND is a 64 y.o. female who presents to [place] [facility] today for follow up of Pelvic fracture. Patient was last seen by Dr. Georgina Snell on 09/12/2019 Low back pain with radiculopathy.  Partially due to vertebral compression fracture and lumbar radiculopathy.  The compression fracture pain seems to have resolved on its own with a bit of time. Since last visit patient has suffered a fall in her bathroom when she slipped on the floor. Patient was unable to walk due to the pain and went to the ED had an xray and showed a fracture in pelvis. Patient is using a walker to ambulate today and states her pain is a lot better than it was when she first fell, that PT is helping a lot with her pain. She was advised to follow up with Dr Marcelino Scot but had trouble scheduling.  Overall she is feeling a lot better and able to ambulate with a walker and home physical therapy.  Pertinent review of systems: No fevers or chills  Relevant historical information: Liver cirrhosis, history lumbar compression fracture, osteoporosis history.  Takes 1000 units of vitamin D daily.   Exam:  BP (!) 170/90 (BP Location: Left Arm, Patient Position: Sitting, Cuff Size: Normal)   Pulse 80   Ht 5' 6"  (1.676 m)   Wt 198 lb (89.8 kg)   SpO2 96%   BMI 31.96 kg/m  General: Well Developed, well nourished, and in no acute distress.   MSK: Left hip normal-appearing decreased motion.  Able to ambulate with a walker.    Lab and Radiology Results  X-ray images pelvis obtained today personally independently interpreted. Superior and inferior left-sided pubic ramus fracture present minimally displaced no significant change in alignment or displacement from x-rays obtained September 27.    Assessment and Plan: 64 y.o. female with pubic ramus fracture.  Patient clinically is significantly improving with conservative management.  X-ray per my interpretation today does not show significant change in  alignment.  I have contacted Dr. Carlean Jews office. I suspect there is some communication difficulty between the patient and the office.  They will contact me or the patient if needed.  Either follow-up with me in 2 weeks as scheduled or with Dr. Marcelino Scot.  Happy to continue conservative management.  Additionally I do not see an updated bone density test.  Given that this is not her first osteoporotic type fracture we will update bone density test.  Likely she will benefit from Prolia or similar treatment.  Advised patient to increase vitamin D to 5000 units daily.    Orders Placed This Encounter  Procedures  . DG Pelvis Comp Min 3V    Standing Status:   Future    Number of Occurrences:   1    Standing Expiration Date:   03/19/2021    Order Specific Question:   Reason for Exam (SYMPTOM  OR DIAGNOSIS REQUIRED)    Answer:   eval fx    Order Specific Question:   Preferred imaging location?    Answer:   [facility]  . DG Bone Density    Standing Status:   Future    Standing Expiration Date:   03/19/2021    Order Specific Question:   Reason for Exam (SYMPTOM  OR DIAGNOSIS REQUIRED)    Answer:   eval bone density after fracture    Order Specific Question:   Preferred imaging location?    Answer:   [facility]   No  orders of the defined types were placed in this encounter.    Discussed warning signs or symptoms. Please see discharge instructions. Patient expresses understanding.   The above documentation has been reviewed and is accurate and complete Lynne Leader, M.D.

## 2020-03-20 DIAGNOSIS — S32592D Other specified fracture of left pubis, subsequent encounter for fracture with routine healing: Secondary | ICD-10-CM | POA: Diagnosis not present

## 2020-03-20 DIAGNOSIS — E785 Hyperlipidemia, unspecified: Secondary | ICD-10-CM | POA: Diagnosis not present

## 2020-03-20 DIAGNOSIS — I10 Essential (primary) hypertension: Secondary | ICD-10-CM | POA: Diagnosis not present

## 2020-03-20 DIAGNOSIS — M810 Age-related osteoporosis without current pathological fracture: Secondary | ICD-10-CM | POA: Diagnosis not present

## 2020-03-20 DIAGNOSIS — M47816 Spondylosis without myelopathy or radiculopathy, lumbar region: Secondary | ICD-10-CM | POA: Diagnosis not present

## 2020-03-20 NOTE — Progress Notes (Signed)
X-ray pelvis show stable fractures and arthritis of the right hip.  I did talk with the orthopedic surgeon's office and they may contact you in the near future.

## 2020-03-22 DIAGNOSIS — S32592D Other specified fracture of left pubis, subsequent encounter for fracture with routine healing: Secondary | ICD-10-CM | POA: Diagnosis not present

## 2020-03-22 DIAGNOSIS — E785 Hyperlipidemia, unspecified: Secondary | ICD-10-CM | POA: Diagnosis not present

## 2020-03-22 DIAGNOSIS — M47816 Spondylosis without myelopathy or radiculopathy, lumbar region: Secondary | ICD-10-CM | POA: Diagnosis not present

## 2020-03-22 DIAGNOSIS — I10 Essential (primary) hypertension: Secondary | ICD-10-CM | POA: Diagnosis not present

## 2020-03-22 DIAGNOSIS — M810 Age-related osteoporosis without current pathological fracture: Secondary | ICD-10-CM | POA: Diagnosis not present

## 2020-03-26 DIAGNOSIS — E785 Hyperlipidemia, unspecified: Secondary | ICD-10-CM | POA: Diagnosis not present

## 2020-03-26 DIAGNOSIS — M810 Age-related osteoporosis without current pathological fracture: Secondary | ICD-10-CM | POA: Diagnosis not present

## 2020-03-26 DIAGNOSIS — S32592D Other specified fracture of left pubis, subsequent encounter for fracture with routine healing: Secondary | ICD-10-CM | POA: Diagnosis not present

## 2020-03-26 DIAGNOSIS — I10 Essential (primary) hypertension: Secondary | ICD-10-CM | POA: Diagnosis not present

## 2020-03-26 DIAGNOSIS — M47816 Spondylosis without myelopathy or radiculopathy, lumbar region: Secondary | ICD-10-CM | POA: Diagnosis not present

## 2020-03-29 DIAGNOSIS — M47816 Spondylosis without myelopathy or radiculopathy, lumbar region: Secondary | ICD-10-CM | POA: Diagnosis not present

## 2020-03-29 DIAGNOSIS — M810 Age-related osteoporosis without current pathological fracture: Secondary | ICD-10-CM | POA: Diagnosis not present

## 2020-03-29 DIAGNOSIS — I10 Essential (primary) hypertension: Secondary | ICD-10-CM | POA: Diagnosis not present

## 2020-03-29 DIAGNOSIS — S32592D Other specified fracture of left pubis, subsequent encounter for fracture with routine healing: Secondary | ICD-10-CM | POA: Diagnosis not present

## 2020-03-29 DIAGNOSIS — E785 Hyperlipidemia, unspecified: Secondary | ICD-10-CM | POA: Diagnosis not present

## 2020-04-02 ENCOUNTER — Encounter: Payer: Self-pay | Admitting: Family Medicine

## 2020-04-02 ENCOUNTER — Ambulatory Visit (INDEPENDENT_AMBULATORY_CARE_PROVIDER_SITE_OTHER): Payer: 59

## 2020-04-02 ENCOUNTER — Ambulatory Visit (INDEPENDENT_AMBULATORY_CARE_PROVIDER_SITE_OTHER): Payer: 59 | Admitting: Family Medicine

## 2020-04-02 ENCOUNTER — Other Ambulatory Visit: Payer: Self-pay | Admitting: Physical Therapy

## 2020-04-02 ENCOUNTER — Other Ambulatory Visit: Payer: Self-pay

## 2020-04-02 VITALS — BP 178/90 | HR 61 | Ht 66.0 in | Wt 200.0 lb

## 2020-04-02 DIAGNOSIS — S32502A Unspecified fracture of left pubis, initial encounter for closed fracture: Secondary | ICD-10-CM | POA: Diagnosis not present

## 2020-04-02 DIAGNOSIS — S32592A Other specified fracture of left pubis, initial encounter for closed fracture: Secondary | ICD-10-CM

## 2020-04-02 DIAGNOSIS — S32301D Unspecified fracture of right ilium, subsequent encounter for fracture with routine healing: Secondary | ICD-10-CM

## 2020-04-02 MED FILL — NEBIVOLOL HCL 5 MG TABS: 5 | 30 days supply | Qty: 30 | Fill #1

## 2020-04-02 MED FILL — BUDESONIDE 3 MG CAP: 3 | 30 days supply | Qty: 60 | Fill #2

## 2020-04-02 NOTE — Progress Notes (Signed)
    Kimberly Pierce is a 64 y.o. female who presents to [place] at [facility] today for f/u of a pelvic fracture that she sustained on 03/03/20 when she slipped and fell in her bathroom.  She was initially seen at the ED and was then transferred home where she's been having home health PT.  She was last seen by Dr. Georgina Snell on 03/19/20 and a bone density test was ordered.  Since her last visit, pt reports she is improving.  She has less groin pain and more ability to walk.  She is switched from walker to a cane.  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  Diagnostic testing: Pelvis XR- 03/19/20, 03/05/20; L hip XR- 03/03/20   Pertinent review of systems: No fevers or chills  Relevant historical information: Liver cirrhosis Works as a Secretary/administrator at [employer].   Exam:  BP (!) 178/90 (BP Location: Right Arm, Patient Position: Sitting, Cuff Size: Large)   Pulse 61   Ht 5' 6"  (1.676 m)   Wt 200 lb (90.7 kg)   SpO2 97%   BMI 32.28 kg/m  General: Well Developed, well nourished, and in no acute distress.   MSK: Left hip normal motion nontender.    Lab and Radiology Results X-ray images pelvis obtained today personally and independently interpreted. No change in angle or displacement of fracture at the inferior and superior left pubic rami. Periosteal reabsorption and early bone healing changes present. Await formal radiology interpretation    Assessment and Plan: 64 y.o. female with fracture of left pubic rami superior and inferior.  Fracture is now about 68 month old.  Clinically she is doing much better however still has some healing to go.  Plan to recheck back in 2 weeks.  Anticipate return to work in about a month maybe longer.  Continue physical therapy and home exercises.  Calcium and vitamin D.   PDMP not reviewed this encounter. No orders of the defined types were placed in this encounter.  No  orders of the defined types were placed in this encounter.    Discussed warning signs or symptoms. Please see discharge instructions. Patient expresses understanding.   The above documentation has been reviewed and is accurate and complete Lynne Leader, M.D.

## 2020-04-02 NOTE — Patient Instructions (Signed)
Thank you for coming in today.  Recheck in 2 weeks.  Return sooner if needed.  Advance activity as tolerated.   Continue Vit D.   I will complete short term disability paperwork this week.

## 2020-04-03 NOTE — Progress Notes (Signed)
Fracture still present pubic rami.  Not much change in appearance

## 2020-04-04 DIAGNOSIS — S32592D Other specified fracture of left pubis, subsequent encounter for fracture with routine healing: Secondary | ICD-10-CM | POA: Diagnosis not present

## 2020-04-04 DIAGNOSIS — M47816 Spondylosis without myelopathy or radiculopathy, lumbar region: Secondary | ICD-10-CM | POA: Diagnosis not present

## 2020-04-04 DIAGNOSIS — M810 Age-related osteoporosis without current pathological fracture: Secondary | ICD-10-CM | POA: Diagnosis not present

## 2020-04-04 DIAGNOSIS — I10 Essential (primary) hypertension: Secondary | ICD-10-CM | POA: Diagnosis not present

## 2020-04-04 DIAGNOSIS — E785 Hyperlipidemia, unspecified: Secondary | ICD-10-CM | POA: Diagnosis not present

## 2020-04-06 NOTE — Progress Notes (Signed)
Spoke c pt and confirmed she understood her XR results.   Kimberly Pierce

## 2020-04-09 DIAGNOSIS — I10 Essential (primary) hypertension: Secondary | ICD-10-CM | POA: Diagnosis not present

## 2020-04-09 DIAGNOSIS — M47816 Spondylosis without myelopathy or radiculopathy, lumbar region: Secondary | ICD-10-CM | POA: Diagnosis not present

## 2020-04-09 DIAGNOSIS — M810 Age-related osteoporosis without current pathological fracture: Secondary | ICD-10-CM | POA: Diagnosis not present

## 2020-04-09 DIAGNOSIS — S32592D Other specified fracture of left pubis, subsequent encounter for fracture with routine healing: Secondary | ICD-10-CM | POA: Diagnosis not present

## 2020-04-09 DIAGNOSIS — E785 Hyperlipidemia, unspecified: Secondary | ICD-10-CM | POA: Diagnosis not present

## 2020-04-13 NOTE — Progress Notes (Signed)
   I, [name], LAT, ATC, am serving as scribe for Dr. Lynne Leader.  Kimberly Pierce is a 64 y.o. female who presents to [place] at [facility] today for f/u of pelvic fracture that she sustained on 03/03/20 when she slipped and fell in her bathroom.  She was last seen by Dr. Georgina Snell on 04/02/20 and noted improvement in her symptoms and an improved ability to walk.  She was ambulating w/ a standard cane.  Since her last visit, pt reports that her pain is improving and notes little to no pain w/ ambulation.  She con't to ambulate w/ a standard cane.  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 History vertebral compression fracture, arthritis of the knees, hypertension, cirrhosis of the liver, hyperlipidemia   Exam:  BP (!) 178/100 (BP Location: Right Arm, Patient Position: Sitting, Cuff Size: Large)   Pulse (!) 57   Ht 5' 6"  (1.676 m)   Wt 200 lb (90.7 kg)   SpO2 98%   BMI 32.28 kg/m  General: Well Developed, well nourished, and in no acute distress.   MSK: Left hip normal-appearing not tender to palpation slight pain with internal rotation.  Gait with a cane.    Lab and Radiology Results X-ray images pelvis obtained today personally and independently interpreted. Fracture of the left superior and inferior pubic rami still present.  The inferior pubic ramus fracture is slightly displaced with no change in alignment since prior x-ray.  Significant early callus formation are present.  Fracture line is still present however. Healing superior and inferior pubic rami fracture left     Assessment and Plan: 64 y.o. female with left pubic rami fracture superior and inferior.  Healing per my interpretation of the x-ray today.  Radiology overread is still pending.  Clinically she is improving but not fully  resolved.  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  Anticipate this will be another month or so.  Recheck in 2 weeks we will repeat x-ray at that time.   PDMP not reviewed this encounter. Orders Placed This Encounter  Procedures  . DG Pelvis Comp Min 3V    Standing Status:   Future    Number of Occurrences:   1    Standing Expiration Date:   04/16/2021    Order Specific Question:   Reason for Exam (SYMPTOM  OR DIAGNOSIS REQUIRED)    Answer:   f/u fx    Order Specific Question:   Preferred imaging location?    Answer:   [facility]   No orders of the defined types were placed in this encounter.    Discussed warning signs or symptoms. Please see discharge instructions. Patient expresses understanding.   The above documentation has been reviewed and is accurate and complete Lynne Leader, M.D.

## 2020-04-16 ENCOUNTER — Telehealth: Payer: Self-pay | Admitting: Family Medicine

## 2020-04-16 ENCOUNTER — Encounter: Payer: Self-pay | Admitting: Family Medicine

## 2020-04-16 ENCOUNTER — Ambulatory Visit (INDEPENDENT_AMBULATORY_CARE_PROVIDER_SITE_OTHER): Payer: 59

## 2020-04-16 ENCOUNTER — Ambulatory Visit (INDEPENDENT_AMBULATORY_CARE_PROVIDER_SITE_OTHER): Payer: 59 | Admitting: Family Medicine

## 2020-04-16 ENCOUNTER — Other Ambulatory Visit (INDEPENDENT_AMBULATORY_CARE_PROVIDER_SITE_OTHER): Payer: 59

## 2020-04-16 VITALS — BP 178/100 | HR 57 | Ht 66.0 in | Wt 200.0 lb

## 2020-04-16 DIAGNOSIS — K7460 Unspecified cirrhosis of liver: Secondary | ICD-10-CM

## 2020-04-16 DIAGNOSIS — M47816 Spondylosis without myelopathy or radiculopathy, lumbar region: Secondary | ICD-10-CM | POA: Diagnosis not present

## 2020-04-16 DIAGNOSIS — S32592A Other specified fracture of left pubis, initial encounter for closed fracture: Secondary | ICD-10-CM | POA: Diagnosis not present

## 2020-04-16 DIAGNOSIS — S32502A Unspecified fracture of left pubis, initial encounter for closed fracture: Secondary | ICD-10-CM | POA: Diagnosis not present

## 2020-04-16 DIAGNOSIS — M810 Age-related osteoporosis without current pathological fracture: Secondary | ICD-10-CM | POA: Diagnosis not present

## 2020-04-16 DIAGNOSIS — S32502D Unspecified fracture of left pubis, subsequent encounter for fracture with routine healing: Secondary | ICD-10-CM | POA: Diagnosis not present

## 2020-04-16 DIAGNOSIS — I10 Essential (primary) hypertension: Secondary | ICD-10-CM | POA: Diagnosis not present

## 2020-04-16 DIAGNOSIS — S32592D Other specified fracture of left pubis, subsequent encounter for fracture with routine healing: Secondary | ICD-10-CM | POA: Diagnosis not present

## 2020-04-16 DIAGNOSIS — E785 Hyperlipidemia, unspecified: Secondary | ICD-10-CM | POA: Diagnosis not present

## 2020-04-16 LAB — HEPATIC FUNCTION PANEL
ALT: 22 U/L (ref 0–35)
AST: 23 U/L (ref 0–37)
Albumin: 4.5 g/dL (ref 3.5–5.2)
Alkaline Phosphatase: 128 U/L — ABNORMAL HIGH (ref 39–117)
Bilirubin, Direct: 0.2 mg/dL (ref 0.0–0.3)
Total Bilirubin: 1.1 mg/dL (ref 0.2–1.2)
Total Protein: 7.9 g/dL (ref 6.0–8.3)

## 2020-04-16 NOTE — Patient Instructions (Signed)
Thank you for coming in today.  Lets recheck the xray in another 2 weeks.  Ok to advance activity as tolerated by pain.  I will get the radiology read to you tomorrow or the next day.

## 2020-04-16 NOTE — Telephone Encounter (Signed)
Mel from [facility] called requesting verbal orders to continue PT one time a week for 3 weeks. I gave the okay for them to continue.  Call back number if needed: [phone]

## 2020-04-17 LAB — IGG: IgG (Immunoglobin G), Serum: 1522 mg/dL (ref 600–1540)

## 2020-04-17 NOTE — Progress Notes (Signed)
Fracture appears to be stable

## 2020-04-18 ENCOUNTER — Other Ambulatory Visit: Payer: Self-pay

## 2020-04-18 DIAGNOSIS — K754 Autoimmune hepatitis: Secondary | ICD-10-CM

## 2020-04-25 DIAGNOSIS — E785 Hyperlipidemia, unspecified: Secondary | ICD-10-CM | POA: Diagnosis not present

## 2020-04-25 DIAGNOSIS — M810 Age-related osteoporosis without current pathological fracture: Secondary | ICD-10-CM | POA: Diagnosis not present

## 2020-04-25 DIAGNOSIS — M47816 Spondylosis without myelopathy or radiculopathy, lumbar region: Secondary | ICD-10-CM | POA: Diagnosis not present

## 2020-04-25 DIAGNOSIS — I10 Essential (primary) hypertension: Secondary | ICD-10-CM | POA: Diagnosis not present

## 2020-04-25 DIAGNOSIS — S32592D Other specified fracture of left pubis, subsequent encounter for fracture with routine healing: Secondary | ICD-10-CM | POA: Diagnosis not present

## 2020-04-27 NOTE — Progress Notes (Signed)
I, Kimberly Pierce, LAT, ATC, am serving as scribe for Dr. Lynne Leader.  Kimberly Pierce is a 64 y.o. female who presents to [place] at [facility] today for f/u of pelvic fx that she sustained on 03/03/20 when she slipped and fell in her bathroom.  She was last seen by Dr. Georgina Snell for f/u on 04/16/20 and noted con't improvement in her symptoms and in her ability to walk.  She has been ambulating w/ a standard cane.  Since her last visit, pt reports hips are not bothering her. Pt reports that she has a sore on the posterior aspect of L lower leg from her stay in hospital. Pt thinks it's related to a medication she took while in the hospital. Pt was DC from hospital on 03/06/20.  She denies any wounds or injury.  She notes the spot on the back of her leg started as a rash related to medication that she was given.  She has tried some [place] on it which has helped a little.  Diagnostic testing: Pelvic XR- 04/16/20, 04/02/20, 03/19/20, 03/05/20; L hip XR- 03/03/20  Pertinent review of systems: No fevers or chills  Relevant historical information: Cirrhosis, decreased bone mineral density,   Exam:  BP 138/78 (BP Location: Right Arm, Patient Position: Sitting, Cuff Size: Normal)   Pulse 90   Ht 5' 6"  (1.676 m)   Wt 200 lb (90.7 kg)   SpO2 96%   BMI 32.28 kg/m  General: Well Developed, well nourished, and in no acute distress.   MSK: Left hip normal-appearing normal motion.  Slow gait with use of cane.  Skin: Left posterior knee small nonerythematous papule.  Nontender.    Lab and Radiology Results X-ray images pelvis obtained today personally independently interpreted Fracture line still visible left pubic superior and inferior ramus without significant change in alignment.  Confirmation present. Await formal radiology review    Assessment and Plan: 64 y.o. female with pelvis fracture originally occurring about 2 months ago.  Fracture involves the left superior and  inferior pubic rami.  Clinically significantly improving.  X-ray today shows slow healing however radiology overread is still pending.  Plan to advance activity as tolerated and recheck December 20.  She is due to return to work on December 24 for short-term disability paperwork.  She thinks this will probably be okay however would like a recheck before she returns.  Return sooner if needed.  Rash left leg: Irritant dermatitis or allergic dermatitis possibly.  May simply be dry skin at this point.  Plan for trial of triamcinolone cream.  Prescription sent.   PDMP not reviewed this encounter. Orders Placed This Encounter  Procedures  . DG Pelvis Comp Min 3V    Standing Status:   Future    Standing Expiration Date:   04/30/2021    Order Specific Question:   Reason for Exam (SYMPTOM  OR DIAGNOSIS REQUIRED)    Answer:   Pelvic fracture    Order Specific Question:   Preferred imaging location?    Answer:   [facility]   Meds ordered this encounter  Medications  . triamcinolone cream (KENALOG) 0.5 %    Sig: Apply 1 application topically 2 (two) times daily. To affected areas.    Dispense:  30 g    Refill:  3     Discussed warning signs or symptoms. Please see discharge instructions. Patient expresses understanding.   The above documentation has been reviewed and is accurate and complete  Lynne Leader, M.D.

## 2020-04-30 ENCOUNTER — Ambulatory Visit (INDEPENDENT_AMBULATORY_CARE_PROVIDER_SITE_OTHER): Payer: 59

## 2020-04-30 ENCOUNTER — Other Ambulatory Visit: Payer: Self-pay | Admitting: Family Medicine

## 2020-04-30 ENCOUNTER — Ambulatory Visit (INDEPENDENT_AMBULATORY_CARE_PROVIDER_SITE_OTHER): Payer: 59 | Admitting: Family Medicine

## 2020-04-30 ENCOUNTER — Other Ambulatory Visit: Payer: Self-pay

## 2020-04-30 VITALS — BP 138/78 | HR 90 | Ht 66.0 in | Wt 200.0 lb

## 2020-04-30 DIAGNOSIS — S32512D Fracture of superior rim of left pubis, subsequent encounter for fracture with routine healing: Secondary | ICD-10-CM | POA: Diagnosis not present

## 2020-04-30 DIAGNOSIS — R21 Rash and other nonspecific skin eruption: Secondary | ICD-10-CM

## 2020-04-30 DIAGNOSIS — Z8781 Personal history of (healed) traumatic fracture: Secondary | ICD-10-CM

## 2020-04-30 DIAGNOSIS — S32502D Unspecified fracture of left pubis, subsequent encounter for fracture with routine healing: Secondary | ICD-10-CM | POA: Diagnosis not present

## 2020-04-30 MED ORDER — TRIAMCINOLONE ACETONIDE 0.5 % EX CREA: 1.0000 "application " | TOPICAL_CREAM | Freq: Two times a day (BID) | CUTANEOUS | 3 refills | Status: DC

## 2020-04-30 MED FILL — TRIAMCINOLONE ACETONIDE 0.5: 0.5 | 7 days supply | Qty: 30 | Fill #0

## 2020-04-30 NOTE — Patient Instructions (Addendum)
Thank you for coming in today.  Get xray today.  Recheck about Dec 20th.  We will repeat xray and reassess return to work.   Return sooner or contact me sooner if needed.   As for the wound on the back of the left knee try using the prescription triamcinolone cream twice daily until better.  Let me know if you have a problem getting it.

## 2020-05-01 NOTE — Progress Notes (Signed)
Fracture is healing but not fully healed

## 2020-05-02 DIAGNOSIS — M810 Age-related osteoporosis without current pathological fracture: Secondary | ICD-10-CM | POA: Diagnosis not present

## 2020-05-02 DIAGNOSIS — M47816 Spondylosis without myelopathy or radiculopathy, lumbar region: Secondary | ICD-10-CM | POA: Diagnosis not present

## 2020-05-02 DIAGNOSIS — E785 Hyperlipidemia, unspecified: Secondary | ICD-10-CM | POA: Diagnosis not present

## 2020-05-02 DIAGNOSIS — I10 Essential (primary) hypertension: Secondary | ICD-10-CM | POA: Diagnosis not present

## 2020-05-02 DIAGNOSIS — S32592D Other specified fracture of left pubis, subsequent encounter for fracture with routine healing: Secondary | ICD-10-CM | POA: Diagnosis not present

## 2020-05-07 ENCOUNTER — Telehealth: Payer: Self-pay | Admitting: Internal Medicine

## 2020-05-07 DIAGNOSIS — M810 Age-related osteoporosis without current pathological fracture: Secondary | ICD-10-CM | POA: Diagnosis not present

## 2020-05-07 DIAGNOSIS — E785 Hyperlipidemia, unspecified: Secondary | ICD-10-CM | POA: Diagnosis not present

## 2020-05-07 DIAGNOSIS — S32592D Other specified fracture of left pubis, subsequent encounter for fracture with routine healing: Secondary | ICD-10-CM | POA: Diagnosis not present

## 2020-05-07 DIAGNOSIS — M47816 Spondylosis without myelopathy or radiculopathy, lumbar region: Secondary | ICD-10-CM | POA: Diagnosis not present

## 2020-05-07 DIAGNOSIS — I10 Essential (primary) hypertension: Secondary | ICD-10-CM | POA: Diagnosis not present

## 2020-05-07 MED ORDER — BUDESONIDE 3 MG PO CPEP: 6.0000 mg | ORAL_CAPSULE | Freq: Every day | ORAL | 1 refills | Status: DC

## 2020-05-07 NOTE — Telephone Encounter (Signed)
Pt is requesting a call back from a nurse to discuss if she needs a refill on her budesonide. Pharmacy verified.

## 2020-05-07 NOTE — Addendum Note (Signed)
Addended by: Dorisann Frames L on: 05/07/2020 03:16 PM   Modules accepted: Orders

## 2020-05-07 NOTE — Telephone Encounter (Signed)
Informed patient that Dr. Hilarie Fredrickson recommends her continuing budesonide 6 mg daily until scheduled appt in January. Patient requests a refill be sent to her pharmacy. Prescription sent to her pharmacy.

## 2020-05-08 MED FILL — NEBIVOLOL HCL 5 MG TABS: 5 | 30 days supply | Qty: 30 | Fill #0

## 2020-05-08 MED FILL — BUDESONIDE 3 MG CAP: 3 | 30 days supply | Qty: 60 | Fill #0

## 2020-05-08 MED FILL — BUDESONIDE 3 MG CAP: 3 | 30 days supply | Qty: 60 | Fill #0 | Status: TO

## 2020-05-28 ENCOUNTER — Other Ambulatory Visit: Payer: Self-pay

## 2020-05-28 ENCOUNTER — Ambulatory Visit (INDEPENDENT_AMBULATORY_CARE_PROVIDER_SITE_OTHER): Payer: 59 | Admitting: Family Medicine

## 2020-05-28 ENCOUNTER — Ambulatory Visit (INDEPENDENT_AMBULATORY_CARE_PROVIDER_SITE_OTHER): Payer: 59

## 2020-05-28 VITALS — BP 148/98 | HR 70 | Ht 66.0 in

## 2020-05-28 DIAGNOSIS — S32511D Fracture of superior rim of right pubis, subsequent encounter for fracture with routine healing: Secondary | ICD-10-CM | POA: Diagnosis not present

## 2020-05-28 DIAGNOSIS — Z8781 Personal history of (healed) traumatic fracture: Secondary | ICD-10-CM | POA: Diagnosis not present

## 2020-05-28 DIAGNOSIS — S32502D Unspecified fracture of left pubis, subsequent encounter for fracture with routine healing: Secondary | ICD-10-CM

## 2020-05-28 NOTE — Progress Notes (Signed)
   I, [name], LAT, ATC, am serving as scribe for Dr. Lynne Leader.  Kimberly Pierce is a 64 y.o. female who presents to [place] at [facility] today for f/u of a pelvic fx that she sustained on 03/03/20 when she slipped and fell in her bathroom.  She was last seen by Dr. Georgina Snell on 04/30/20 and noted marked improvement in her pelvis and hips.  She also reported a sore on the posterior aspect of her L lower leg that she felt was associated or due to a stay in the hospital in late September 2021.  Since her last visit, pt reports no hip soreness and hip is not painful at all. Pt is no longer using any assistive walking devices.  She works as a Sports coach at [employer].  She think she is not quite ready to return to work but thinks should she be able to do her demanding work duties pretty soon.  Diagnostic imaging: Pelvis XR- 04/30/20, 04/16/20, 04/02/20, 03/19/20 and 03/05/20  Pertinent review of systems: No fevers or chills  Relevant historical information: Hypertension, cirrhosis,   Exam:  BP (!) 148/98 (BP Location: Right Arm, Patient Position: Sitting, Cuff Size: Normal)   Pulse 70   Ht 5' 6"  (1.676 m)   SpO2 98%   BMI 32.28 kg/m  General: Well Developed, well nourished, and in no acute distress.   MSK: Left hip normal-appearing nontender decreased motion.  Slow gait.    Lab and Radiology Results X-ray images left hip obtained today personally and independently interpreted Healing fracture left superior and inferior pubic rami with still some fracture line present but lots of callus formation present.  No change in angulation or displacement since previous x-ray. Await formal radiology review    Assessment and Plan: 64 y.o. female with healing pelvic fracture.  Clinically much improved.  Still has some healing to do on x-ray.  We discussed return to work.  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  Came to January 14 as a targeted return to work  date.  This seems about right.  She should continue to advance her activity now such that she is able to have a successful return to work on the 14th.  Recheck back as needed.   PDMP not reviewed this encounter. Orders Placed This Encounter  Procedures  . DG Pelvis Comp Min 3V    Standing Status:   Future    Number of Occurrences:   1    Standing Expiration Date:   06/28/2020    Order Specific Question:   Reason for Exam (SYMPTOM  OR DIAGNOSIS REQUIRED)    Answer:   Pelvis fracture    Order Specific Question:   Preferred imaging location?    Answer:   [facility]   No orders of the defined types were placed in this encounter.    Discussed warning signs or symptoms. Please see discharge instructions. Patient expresses understanding.   The above documentation has been reviewed and is accurate and complete Lynne Leader, M.D.

## 2020-05-28 NOTE — Patient Instructions (Signed)
Thank you for coming in today.  Plan to return to work on Jan 14th (Friday).   Recheck with me as needed.   I am here for you if something comes up.

## 2020-05-29 NOTE — Progress Notes (Signed)
Fracture still present on x-ray but is healing

## 2020-06-12 MED FILL — BUDESONIDE 3 MG CAP: 3 | 30 days supply | Qty: 60 | Fill #0

## 2020-06-19 ENCOUNTER — Other Ambulatory Visit (INDEPENDENT_AMBULATORY_CARE_PROVIDER_SITE_OTHER): Payer: 59

## 2020-06-19 ENCOUNTER — Telehealth: Payer: Self-pay | Admitting: *Deleted

## 2020-06-19 ENCOUNTER — Encounter: Payer: Self-pay | Admitting: Internal Medicine

## 2020-06-19 ENCOUNTER — Other Ambulatory Visit: Payer: Self-pay | Admitting: Internal Medicine

## 2020-06-19 ENCOUNTER — Ambulatory Visit (INDEPENDENT_AMBULATORY_CARE_PROVIDER_SITE_OTHER): Payer: 59 | Admitting: Internal Medicine

## 2020-06-19 VITALS — BP 190/110 | HR 88 | Ht 64.25 in | Wt 203.1 lb

## 2020-06-19 DIAGNOSIS — Z1211 Encounter for screening for malignant neoplasm of colon: Secondary | ICD-10-CM | POA: Diagnosis not present

## 2020-06-19 DIAGNOSIS — K754 Autoimmune hepatitis: Secondary | ICD-10-CM

## 2020-06-19 DIAGNOSIS — D132 Benign neoplasm of duodenum: Secondary | ICD-10-CM | POA: Diagnosis not present

## 2020-06-19 DIAGNOSIS — K2100 Gastro-esophageal reflux disease with esophagitis, without bleeding: Secondary | ICD-10-CM

## 2020-06-19 DIAGNOSIS — K7460 Unspecified cirrhosis of liver: Secondary | ICD-10-CM

## 2020-06-19 LAB — COMPREHENSIVE METABOLIC PANEL
ALT: 69 U/L — ABNORMAL HIGH (ref 0–35)
AST: 62 U/L — ABNORMAL HIGH (ref 0–37)
Albumin: 4.4 g/dL (ref 3.5–5.2)
Alkaline Phosphatase: 157 U/L — ABNORMAL HIGH (ref 39–117)
BUN: 14 mg/dL (ref 6–23)
CO2: 20 mEq/L (ref 19–32)
Calcium: 9.6 mg/dL (ref 8.4–10.5)
Chloride: 105 mEq/L (ref 96–112)
Creatinine, Ser: 0.56 mg/dL (ref 0.40–1.20)
GFR: 95.77 mL/min (ref >60.00)
Glucose, Bld: 114 mg/dL — ABNORMAL HIGH (ref 70–99)
Potassium: 3.9 mEq/L (ref 3.5–5.1)
Sodium: 136 mEq/L (ref 135–145)
Total Bilirubin: 0.9 mg/dL (ref 0.2–1.2)
Total Protein: 8 g/dL (ref 6.0–8.3)

## 2020-06-19 LAB — CBC WITH DIFFERENTIAL/PLATELET
Basophils Absolute: 0.1 10*3/uL (ref 0.0–0.1)
Basophils Relative: 0.9 % (ref 0.0–3.0)
Eosinophils Absolute: 0.1 10*3/uL (ref 0.0–0.7)
Eosinophils Relative: 2.2 % (ref 0.0–5.0)
HCT: 39.4 % (ref 36.0–46.0)
Hemoglobin: 13.3 g/dL (ref 12.0–15.0)
Lymphocytes Relative: 52.9 % — ABNORMAL HIGH (ref 12.0–46.0)
Lymphs Abs: 3 10*3/uL (ref 0.7–4.0)
MCHC: 33.7 g/dL (ref 30.0–36.0)
MCV: 96.4 fl (ref 78.0–100.0)
Monocytes Absolute: 0.3 10*3/uL (ref 0.1–1.0)
Monocytes Relative: 6.1 % (ref 3.0–12.0)
Neutro Abs: 2.1 10*3/uL (ref 1.4–7.7)
Neutrophils Relative %: 37.9 % — ABNORMAL LOW (ref 43.0–77.0)
Platelets: 169 10*3/uL (ref 150.0–400.0)
RBC: 4.09 Mil/uL (ref 3.87–5.11)
RDW: 14.2 % (ref 11.5–15.5)
WBC: 5.6 10*3/uL (ref 4.0–10.5)

## 2020-06-19 LAB — PROTIME-INR
INR: 1.1 ratio — ABNORMAL HIGH (ref 0.8–1.0)
Prothrombin Time: 12 s (ref 9.6–13.1)

## 2020-06-19 LAB — HEPATIC FUNCTION PANEL
ALT: 69 U/L — ABNORMAL HIGH (ref 0–35)
AST: 62 U/L — ABNORMAL HIGH (ref 0–37)
Albumin: 4.4 g/dL (ref 3.5–5.2)
Alkaline Phosphatase: 157 U/L — ABNORMAL HIGH (ref 39–117)
Bilirubin, Direct: 0.2 mg/dL (ref 0.0–0.3)
Total Bilirubin: 0.9 mg/dL (ref 0.2–1.2)
Total Protein: 8 g/dL (ref 6.0–8.3)

## 2020-06-19 MED ORDER — BUDESONIDE 3 MG PO CPEP
3.0000 mg | ORAL_CAPSULE | Freq: Every day | ORAL | 5 refills | Status: DC
Start: 2020-06-19 — End: 2020-06-20

## 2020-06-19 MED ORDER — AZATHIOPRINE 50 MG PO TABS: 50.0000 mg | ORAL_TABLET | Freq: Every day | ORAL | 0 refills | Status: DC

## 2020-06-19 MED FILL — AZATHIOPRINE 50 MG TABS: 50 | 30 days supply | Qty: 30 | Fill #0

## 2020-06-19 NOTE — Patient Instructions (Signed)
You have been scheduled for an abdominal ultrasound at [facility] (1st floor of hospital) on 07/09/20 at 8:30 am. Please arrive 15 minutes prior to your appointment for registration. Make certain not to have anything to eat or drink 6 hours prior to your appointment. Should you need to reschedule your appointment, please contact radiology at [phone]. This test typically takes about 30 minutes to perform. ________________________________________________________  Your provider has requested that you go to the basement level for lab work before leaving today. Press "B" on the elevator. The lab is located at the first door on the left as you exit the elevator. ________________________________________________________  We have sent the following medications to your pharmacy for you to pick up at your convenience: Azathioprine 50 mg daily (you should come to the lab 2 weeks after starting this!) Budesonide 3 mg daily (decrease from previous dosing) ________________________________________________________ Your provider has requested that you go to the basement level for lab work on 07/04/20 between 8 am-5 pm. Press "B" on the elevator. The lab is located at the first door on the left as you exit the elevator. ________________________________________________________  Please follow up with Dr Hilarie Fredrickson in 6 months. _________________________________________________________ If you are age 19 or older, your body mass index should be between 23-30. Your Body mass index is 34.6 kg/m. If this is out of the aforementioned range listed, please consider follow up with your Primary Care Provider.  If you are age 41 or younger, your body mass index should be between 19-25. Your Body mass index is 34.6 kg/m. If this is out of the aformentioned range listed, please consider follow up with your Primary Care Provider.  _________________________________________________________  Due to recent changes in  healthcare laws, you may see the results of your imaging and laboratory studies on MyChart before your provider has had a chance to review them.  We understand that in some cases there may be results that are confusing or concerning to you. Not all laboratory results come back in the same time frame and the provider may be waiting for multiple results in order to interpret others.  Please give [place] 48 hours in order for your provider to thoroughly review all the results before contacting the office for clarification of your results.

## 2020-06-19 NOTE — Addendum Note (Signed)
Addended by: Larina Bras on: 06/19/2020 03:10 PM   Modules accepted: Orders

## 2020-06-19 NOTE — Progress Notes (Signed)
Subjective:    Patient ID: Kimberly Pierce, female    DOB: 04-Jun-1956, 65 y.o.   MRN: [id number]  HPI Kimberly Pierce is a 65 year old female with a history of autoimmune hepatitis with cirrhosis, duodenal adenoma, GERD with esophagitis, hypertension, hyperlipidemia, remote DVT, recent pelvis fracture who is here for follow-up.  She is here alone today.  She reports that she is feeling well though unfortunately she fell in her bathroom in September and had pelvic pain.  She was diagnosed with pelvic fracture.  She underwent physical therapy and is now feeling better.  She starts work back this coming Friday.  She has been taking the budesonide 6 mg daily.  She denies issue with abdominal pain, abdominal or lower extremity swelling.  No dark urine.  No itching.  No GI bleeding.  No heartburn or trouble swallowing.  Energy levels for her have been good.  She underwent an upper endoscopy with Dr. Rush Landmark on 01/16/2020 to remove a duodenal adenoma I found during her variceal screening endoscopy.  This showed LA grade B esophagitis, small hiatal hernia, patchy gastritis and an 18 mm adenoma in the second portion of the duodenum which was removed by EMR.  This did not show high-grade dysplasia.  She had a negative Cologuard in January 2019   Review of Systems As per HPI, otherwise negative  Current Medications, Allergies, Past Medical History, Past Surgical History, Family History and Social History were reviewed in [employer] record.     Objective:   Physical Exam BP (!) 190/110 (BP Location: Left Arm, Patient Position: Sitting, Cuff Size: Normal)   Pulse 88   Ht 5' 4.25" (1.632 m) Comment: height measured without shoes  Wt 203 lb 2 oz (92.1 kg)   BMI 34.60 kg/m  Gen: awake, alert, NAD HEENT: anicteric  CV: RRR, no mrg Pulm: CTA b/l Abd: soft, NT/ND, +BS throughout Ext: no c/c/e Neuro: nonfocal, no asterixis  CMP     Component Value Date/Time   NA 134 (L)  03/06/2020 0207   K 3.7 03/06/2020 0207   CL 99 03/06/2020 0207   CO2 24 03/06/2020 0207   GLUCOSE 137 (H) 03/06/2020 0207   BUN 24 (H) 03/06/2020 0207   CREATININE 0.68 03/06/2020 0207   CALCIUM 9.9 03/06/2020 0207   CALCIUM 10.2 05/05/2011 1417   PROT 7.9 04/16/2020 1028   ALBUMIN 4.5 04/16/2020 1028   AST 23 04/16/2020 1028   ALT 22 04/16/2020 1028   ALKPHOS 128 (H) 04/16/2020 1028   BILITOT 1.1 04/16/2020 1028   GFRNONAA >60 03/06/2020 0207   GFRAA >60 03/06/2020 0207   Lab Results  Component Value Date   INR 1.1 (H) 10/31/2019   INR 1.1 (H) 09/20/2019   INR 1.0 05/24/2019   CBC    Component Value Date/Time   WBC 6.2 03/06/2020 0207   RBC 3.70 (L) 03/06/2020 0207   HGB 12.0 03/06/2020 0207   HCT 36.0 03/06/2020 0207   PLT 172 03/06/2020 0207   MCV 97.3 03/06/2020 0207   MCH 32.4 03/06/2020 0207   MCHC 33.3 03/06/2020 0207   RDW 12.3 03/06/2020 0207   LYMPHSABS 2.3 10/31/2019 1438   MONOABS 0.6 10/31/2019 1438   EOSABS 0.1 10/31/2019 1438   BASOSABS 0.0 10/31/2019 1438   MRI abdomen October 2020 --hepatic cirrhosis without neoplasm, ascites or other acute findings    Assessment & Plan:   65 year old female with a history of autoimmune hepatitis with cirrhosis, duodenal adenoma, GERD with esophagitis,  hypertension, hyperlipidemia, remote DVT, recent pelvis fracture who is here for follow-up.   1. cirrhosis in the setting of autoimmune hepatitis --we have treated her with budesonide 6 mg daily and her liver enzymes have normalized.  We discussed this today.  This is further diagnostic evidence for autoimmune hepatitis.  She did have a positive ANA and elevated IgG previously.  Her IgG has normalized along with her enzymes.  She does not have significant portal hypertension or evidence of varices -- Reduce budesonide to 3 mg daily with close attention to liver enzymes -- Begin azathioprine 50 mg daily, may need to dose titrate to 100 mg daily as tolerated.  Her TPMT is  normal.  We discussed the risk of nausea, leukopenia, elevated liver enzymes and pancreatitis.  She is asked to notify me immediately if she develops nausea or upper abdominal pain with this new medication.  Check CMP in 2 weeks after starting azathioprine. -- Upper endoscopy negative for varices as of August 2021; surveillance would be in 2 years so she is going to have a sooner endoscopy for surveillance of duodenal polyp -- HCC screening is due now, ultrasound was scheduled but canceled with her pelvic fracture reschedule ultrasound for [facility] screening -- Check CBC, CMP, INR and AFP today -- She inquires about COVID booster and I encouraged her to proceed as she works at [employer] in housekeeping -- She is immune to hepatitis a and B by vaccination -- 42-monthfollow-up with me  2.  Duodenal adenoma --she is due for surveillance endoscopy with Dr. MRush Landmarknext month which is a 662-monthark.  I will reach out to him to get this scheduled.  If negative and no EMR needed I can survey going forward at an interval based on his recommendation.  3.  Colon cancer screening --negative Cologuard, repeat Cologuard now.  4.  GERD --with history of esophagitis, she is not having symptoms and now off PPI.  We will see if esophagitis is still present at upcoming surveillance upper endoscopy as in #2.  If present she will likely need ongoing PPI.

## 2020-06-19 NOTE — Telephone Encounter (Signed)
Orders and demographic information have been sent to [employer] for cologuard testing. I have left a message for patient to call back.

## 2020-06-19 NOTE — Telephone Encounter (Signed)
-----   Message from Jerene Bears, MD sent at 06/19/2020 12:16 PM EST ----- Need to repeat Cologuard, last Jan 2019.  Repeat now. Thanks [employer]

## 2020-06-20 ENCOUNTER — Other Ambulatory Visit: Payer: Self-pay

## 2020-06-20 ENCOUNTER — Telehealth: Payer: Self-pay

## 2020-06-20 DIAGNOSIS — D132 Benign neoplasm of duodenum: Secondary | ICD-10-CM

## 2020-06-20 DIAGNOSIS — K754 Autoimmune hepatitis: Secondary | ICD-10-CM

## 2020-06-20 LAB — AFP TUMOR MARKER: AFP-Tumor Marker: 5 ng/mL

## 2020-06-20 MED ORDER — BUDESONIDE 3 MG PO CPEP
6.0000 mg | ORAL_CAPSULE | Freq: Every day | ORAL | 5 refills | Status: DC
Start: 2020-06-20 — End: 2020-07-09

## 2020-06-20 NOTE — Telephone Encounter (Signed)
EGD EMR scheduled for 07/30/20 at 1030 am at [facility] with Dr Rush Landmark COVID test on 2/17 at 1015 am

## 2020-06-20 NOTE — Telephone Encounter (Signed)
-----   Message from Irving Copas., MD sent at 06/19/2020  5:53 PM EST ----- JMP no problem she is on her list. Chong Sicilian, please work on scheduling for follow-up in February or March for EGD EMR 75-minute slot.  Thanks. GM  ----- Message ----- From: Jerene Bears, MD Sent: 06/19/2020  12:17 PM EST To: Irving Copas., MD  Chester Holstein, I saw Ms. Blitzer today.  She looks great.   Needs EGD for surveillance after EMR in Feb timeframe.   I think you said you would do this one and then I can take over if done with EMR.  Let me know if otherwise and thanks for your help. Ulice Dash

## 2020-06-20 NOTE — Telephone Encounter (Signed)
Left message for patient to call back  

## 2020-06-20 NOTE — Telephone Encounter (Signed)
Left message on machine to call back  

## 2020-06-21 ENCOUNTER — Telehealth: Payer: Self-pay | Admitting: Family Medicine

## 2020-06-21 NOTE — Telephone Encounter (Signed)
I called Kimberly Pierce. She has a planned RTW date on 06/22/20. She still plans on RTW on the 14th.   Form completed and will be faxed.

## 2020-06-21 NOTE — Telephone Encounter (Signed)
Pt called checking on LTD paperwork.

## 2020-06-21 NOTE — Telephone Encounter (Signed)
EGD/EMR scheduled, pt instructed and medications reviewed.  Patient instructions mailed to home.  Patient to call with any questions or concerns.

## 2020-06-22 NOTE — Telephone Encounter (Signed)
I have spoken to patient regarding the Cologuard kit. She states she actually received this in the mail today. I advised she should be getting a call from [employer] with instructions on how to complete testing. Once we get results, we will be in touch with her. She verbalizes understanding of this information.

## 2020-07-04 ENCOUNTER — Telehealth: Payer: Self-pay

## 2020-07-04 ENCOUNTER — Other Ambulatory Visit (INDEPENDENT_AMBULATORY_CARE_PROVIDER_SITE_OTHER): Payer: 59

## 2020-07-04 DIAGNOSIS — K754 Autoimmune hepatitis: Secondary | ICD-10-CM

## 2020-07-04 LAB — COMPREHENSIVE METABOLIC PANEL
ALT: 23 U/L (ref 0–35)
AST: 29 U/L (ref 0–37)
Albumin: 4.4 g/dL (ref 3.5–5.2)
Alkaline Phosphatase: 92 U/L (ref 39–117)
BUN: 21 mg/dL (ref 6–23)
CO2: 23 mEq/L (ref 19–32)
Calcium: 9.8 mg/dL (ref 8.4–10.5)
Chloride: 105 mEq/L (ref 96–112)
Creatinine, Ser: 1.17 mg/dL (ref 0.40–1.20)
GFR: 48.98 mL/min — ABNORMAL LOW (ref >60.00)
Glucose, Bld: 104 mg/dL — ABNORMAL HIGH (ref 70–99)
Potassium: 3.8 mEq/L (ref 3.5–5.1)
Sodium: 137 mEq/L (ref 135–145)
Total Bilirubin: 0.6 mg/dL (ref 0.2–1.2)
Total Protein: 8 g/dL (ref 6.0–8.3)

## 2020-07-04 NOTE — Telephone Encounter (Signed)
-----   Message from Algernon Huxley, RN sent at 06/20/2020  1:52 PM EST ----- Regarding: Lab Pt needs lab, order in epic.

## 2020-07-04 NOTE — Telephone Encounter (Signed)
Left message for pt that it is time for her to come in for labs, she may come M-F between the hours of 7:30am-5pm, no appt is needed.

## 2020-07-09 ENCOUNTER — Other Ambulatory Visit: Payer: Self-pay

## 2020-07-09 ENCOUNTER — Ambulatory Visit (HOSPITAL_COMMUNITY)
Admission: RE | Admit: 2020-07-09 | Discharge: 2020-07-09 | Disposition: A | Discharge status: Home / Self Care | Payer: 59 | Source: Ambulatory Visit | Attending: Internal Medicine | Admitting: Internal Medicine

## 2020-07-09 DIAGNOSIS — K754 Autoimmune hepatitis: Secondary | ICD-10-CM | POA: Diagnosis not present

## 2020-07-09 DIAGNOSIS — K8020 Calculus of gallbladder without cholecystitis without obstruction: Secondary | ICD-10-CM | POA: Diagnosis not present

## 2020-07-09 DIAGNOSIS — R748 Abnormal levels of other serum enzymes: Secondary | ICD-10-CM

## 2020-07-09 DIAGNOSIS — K7460 Unspecified cirrhosis of liver: Secondary | ICD-10-CM

## 2020-07-09 MED ORDER — AZATHIOPRINE 50 MG PO TABS: 50.0000 mg | ORAL_TABLET | Freq: Every day | ORAL | 1 refills | Status: DC

## 2020-07-09 MED ORDER — BUDESONIDE 3 MG PO CPEP: 3.0000 mg | ORAL_CAPSULE | Freq: Every day | ORAL | 0 refills | Status: DC

## 2020-07-09 MED FILL — BUDESONIDE 3 MG CAP: 3 | 90 days supply | Qty: 90 | Fill #0

## 2020-07-13 MED FILL — AZATHIOPRINE 50 MG TABS: 50 | 30 days supply | Qty: 30 | Fill #0

## 2020-07-23 ENCOUNTER — Other Ambulatory Visit: Payer: Self-pay

## 2020-07-24 MED FILL — BUDESONIDE 3 MG CAP: 3 | 90 days supply | Qty: 90 | Fill #0

## 2020-07-24 MED FILL — AZATHIOPRINE 50 MG TABS: 50 | 30 days supply | Qty: 30 | Fill #0

## 2020-07-26 ENCOUNTER — Other Ambulatory Visit (HOSPITAL_COMMUNITY): Payer: 59

## 2020-07-26 ENCOUNTER — Other Ambulatory Visit (INDEPENDENT_AMBULATORY_CARE_PROVIDER_SITE_OTHER): Payer: 59

## 2020-07-26 DIAGNOSIS — R748 Abnormal levels of other serum enzymes: Secondary | ICD-10-CM

## 2020-07-26 DIAGNOSIS — K7460 Unspecified cirrhosis of liver: Secondary | ICD-10-CM

## 2020-07-26 LAB — CBC WITH DIFFERENTIAL/PLATELET
Basophils Absolute: 0 10*3/uL (ref 0.0–0.1)
Basophils Relative: 0.7 % (ref 0.0–3.0)
Eosinophils Absolute: 0.2 10*3/uL (ref 0.0–0.7)
Eosinophils Relative: 2.7 % (ref 0.0–5.0)
HCT: 40.5 % (ref 36.0–46.0)
Hemoglobin: 13.5 g/dL (ref 12.0–15.0)
Lymphocytes Relative: 20.6 % (ref 12.0–46.0)
Lymphs Abs: 1.2 10*3/uL (ref 0.7–4.0)
MCHC: 33.3 g/dL (ref 30.0–36.0)
MCV: 98 fl (ref 78.0–100.0)
Monocytes Absolute: 0.5 10*3/uL (ref 0.1–1.0)
Monocytes Relative: 8.6 % (ref 3.0–12.0)
Neutro Abs: 3.9 10*3/uL (ref 1.4–7.7)
Neutrophils Relative %: 67.4 % (ref 43.0–77.0)
Platelets: 224 10*3/uL (ref 150.0–400.0)
RBC: 4.14 Mil/uL (ref 3.87–5.11)
RDW: 13.8 % (ref 11.5–15.5)
WBC: 5.8 10*3/uL (ref 4.0–10.5)

## 2020-07-26 LAB — HEPATIC FUNCTION PANEL
ALT: 18 U/L (ref 0–35)
AST: 24 U/L (ref 0–37)
Albumin: 4.3 g/dL (ref 3.5–5.2)
Alkaline Phosphatase: 109 U/L (ref 39–117)
Bilirubin, Direct: 0.3 mg/dL (ref 0.0–0.3)
Total Bilirubin: 1.2 mg/dL (ref 0.2–1.2)
Total Protein: 8 g/dL (ref 6.0–8.3)

## 2020-07-26 LAB — PROTIME-INR
INR: 1.1 ratio — ABNORMAL HIGH (ref 0.8–1.0)
Prothrombin Time: 12.8 s (ref 9.6–13.1)

## 2020-07-27 ENCOUNTER — Other Ambulatory Visit: Payer: Self-pay

## 2020-07-27 ENCOUNTER — Telehealth: Payer: Self-pay

## 2020-07-27 DIAGNOSIS — K754 Autoimmune hepatitis: Secondary | ICD-10-CM

## 2020-07-27 NOTE — Telephone Encounter (Signed)
The pt has been rescheduled to 09/03/20 at 10 am to arrive at 830 am. She will not have to retest for COVID.  She has verbalized understanding of the change.

## 2020-07-27 NOTE — Telephone Encounter (Signed)
Kimberly Pierce, I am sorry to hear about the positive Covid test as a work exposure.  Since it has not been 10 days then we will have to go ahead and reschedule.  March 28 is fine for placement of her procedure.  FYI Dr. Hilarie Fredrickson. GM

## 2020-07-27 NOTE — Telephone Encounter (Signed)
Dr Rush Landmark I received a call from [place] at Endo regarding this pt positive COVID test.  She will need to be rescheduled. Is March 28 ok (not to far out) this is your first available.

## 2020-07-30 DIAGNOSIS — Z1212 Encounter for screening for malignant neoplasm of rectum: Secondary | ICD-10-CM | POA: Diagnosis not present

## 2020-07-30 DIAGNOSIS — Z1211 Encounter for screening for malignant neoplasm of colon: Secondary | ICD-10-CM | POA: Diagnosis not present

## 2020-08-06 LAB — COLOGUARD: COLOGUARD: NEGATIVE

## 2020-08-07 LAB — COLOGUARD: Cologuard: NEGATIVE

## 2020-08-20 ENCOUNTER — Telehealth: Payer: Self-pay | Admitting: Internal Medicine

## 2020-08-20 ENCOUNTER — Other Ambulatory Visit: Payer: Self-pay | Admitting: Internal Medicine

## 2020-08-20 DIAGNOSIS — Z1231 Encounter for screening mammogram for malignant neoplasm of breast: Secondary | ICD-10-CM

## 2020-08-20 MED ORDER — AZATHIOPRINE 50 MG PO TABS: 50.0000 mg | ORAL_TABLET | Freq: Every day | ORAL | 0 refills | Status: DC

## 2020-08-20 NOTE — Telephone Encounter (Signed)
1 month rx sent.

## 2020-08-27 ENCOUNTER — Other Ambulatory Visit: Payer: Self-pay | Admitting: Internal Medicine

## 2020-08-27 ENCOUNTER — Telehealth: Payer: Self-pay | Admitting: Internal Medicine

## 2020-08-27 DIAGNOSIS — M8080XA Other osteoporosis with current pathological fracture, unspecified site, initial encounter for fracture: Secondary | ICD-10-CM

## 2020-08-27 MED ORDER — PROLIA 60 MG/ML ~~LOC~~ SOSY: 60.0000 mg | PREFILLED_SYRINGE | SUBCUTANEOUS | 1 refills | Status: DC

## 2020-08-27 NOTE — Telephone Encounter (Signed)
Patient would like a call back on 4.1.22 to schedule her Prolia  Prolia prescription needs to be sent to [facility], [place] Phone:  [phone]  Fax:  [phone]

## 2020-08-27 NOTE — Telephone Encounter (Signed)
ordered

## 2020-08-27 NOTE — Progress Notes (Signed)
Attempted to obtain medical history via telephone, unable to reach at this time. I left a voicemail to return pre surgical testing department's phone call.  

## 2020-08-28 ENCOUNTER — Telehealth: Payer: Self-pay | Admitting: Internal Medicine

## 2020-08-28 ENCOUNTER — Other Ambulatory Visit (INDEPENDENT_AMBULATORY_CARE_PROVIDER_SITE_OTHER): Payer: 59

## 2020-08-28 ENCOUNTER — Telehealth: Payer: Self-pay | Admitting: Pharmacist

## 2020-08-28 ENCOUNTER — Other Ambulatory Visit: Payer: Self-pay

## 2020-08-28 DIAGNOSIS — K754 Autoimmune hepatitis: Secondary | ICD-10-CM

## 2020-08-28 DIAGNOSIS — R112 Nausea with vomiting, unspecified: Secondary | ICD-10-CM | POA: Diagnosis not present

## 2020-08-28 LAB — LIPASE: Lipase: 26 U/L (ref 11.0–59.0)

## 2020-08-28 LAB — HEPATIC FUNCTION PANEL
ALT: 35 U/L (ref 0–35)
AST: 40 U/L — ABNORMAL HIGH (ref 0–37)
Albumin: 4.7 g/dL (ref 3.5–5.2)
Alkaline Phosphatase: 155 U/L — ABNORMAL HIGH (ref 39–117)
Bilirubin, Direct: 0.2 mg/dL (ref 0.0–0.3)
Total Bilirubin: 0.9 mg/dL (ref 0.2–1.2)
Total Protein: 7.9 g/dL (ref 6.0–8.3)

## 2020-08-28 LAB — CBC WITH DIFFERENTIAL/PLATELET
Basophils Absolute: 0 10*3/uL (ref 0.0–0.1)
Basophils Relative: 0.8 % (ref 0.0–3.0)
Eosinophils Absolute: 0.1 10*3/uL (ref 0.0–0.7)
Eosinophils Relative: 2.3 % (ref 0.0–5.0)
HCT: 41 % (ref 36.0–46.0)
Hemoglobin: 13.9 g/dL (ref 12.0–15.0)
Lymphocytes Relative: 23.9 % (ref 12.0–46.0)
Lymphs Abs: 1.2 10*3/uL (ref 0.7–4.0)
MCHC: 34 g/dL (ref 30.0–36.0)
MCV: 99.4 fl (ref 78.0–100.0)
Monocytes Absolute: 0.4 10*3/uL (ref 0.1–1.0)
Monocytes Relative: 8.6 % (ref 3.0–12.0)
Neutro Abs: 3.1 10*3/uL (ref 1.4–7.7)
Neutrophils Relative %: 64.4 % (ref 43.0–77.0)
Platelets: 198 10*3/uL (ref 150.0–400.0)
RBC: 4.12 Mil/uL (ref 3.87–5.11)
RDW: 14.3 % (ref 11.5–15.5)
WBC: 4.8 10*3/uL (ref 4.0–10.5)

## 2020-08-28 LAB — COMPREHENSIVE METABOLIC PANEL
ALT: 35 U/L (ref 0–35)
AST: 40 U/L — ABNORMAL HIGH (ref 0–37)
Albumin: 4.7 g/dL (ref 3.5–5.2)
Alkaline Phosphatase: 155 U/L — ABNORMAL HIGH (ref 39–117)
BUN: 19 mg/dL (ref 6–23)
CO2: 22 mEq/L (ref 19–32)
Calcium: 10 mg/dL (ref 8.4–10.5)
Chloride: 105 mEq/L (ref 96–112)
Creatinine, Ser: 0.73 mg/dL (ref 0.40–1.20)
GFR: 86.18 mL/min (ref >60.00)
Glucose, Bld: 117 mg/dL — ABNORMAL HIGH (ref 70–99)
Potassium: 4.1 mEq/L (ref 3.5–5.1)
Sodium: 137 mEq/L (ref 135–145)
Total Bilirubin: 0.9 mg/dL (ref 0.2–1.2)
Total Protein: 7.9 g/dL (ref 6.0–8.3)

## 2020-08-28 LAB — AMYLASE: Amylase: 57 U/L (ref 27–131)

## 2020-08-28 MED ORDER — ONDANSETRON 4 MG PO TBDP: 4.0000 mg | ORAL_TABLET | Freq: Four times a day (QID) | ORAL | 1 refills | Status: DC | PRN

## 2020-08-28 MED FILL — ONDANSETRON ODT 4 MG TABLET: 4 | 7 days supply | Qty: 30 | Fill #0

## 2020-08-28 NOTE — Telephone Encounter (Signed)
Ok, a little odd for AZA to cause nausea this long after starting it and previously tolerating it, but would hold it for now. Have her come for CBC, CMP, lipase and amylase Zofran 4 mg ODT every 6 hrs PRN n/v

## 2020-08-28 NOTE — Telephone Encounter (Signed)
Spoke with pt and she is aware. Orders in epic for labs, script sent to pharmacy.

## 2020-08-28 NOTE — Telephone Encounter (Signed)
Called patient to schedule an appointment for the [employer] [facility]. I was unable to reach the patient so I left a HIPAA-compliant message requesting that the patient return my call.   Luke Van Ausdall, PharmD, BCACP, CPP Clinical Pharmacist [facility] [phone]  

## 2020-08-28 NOTE — Telephone Encounter (Signed)
Inbound call from patient requesting a call back please.  States after taking Imuran medication, she has been nauseous and wants to know if medication may cause this.  Please advise.

## 2020-08-28 NOTE — Telephone Encounter (Signed)
Pt states that since last week she has been having issues with nausea and vomiting. Pt thinks that it is the Imuran causing her nausea. Has vomited almost every day. Reports she even vomited after drinking water. Please advise.

## 2020-08-29 LAB — PROTIME-INR
INR: 1.1 ratio — ABNORMAL HIGH (ref 0.8–1.0)
Prothrombin Time: 12.8 s (ref 9.6–13.1)

## 2020-09-02 NOTE — Anesthesia Preprocedure Evaluation (Addendum)
Anesthesia Evaluation  Patient identified by MRN, date of birth, ID band Patient awake    Reviewed: Allergy & Precautions, NPO status , Patient's Chart, lab work & pertinent test results  Airway Mallampati: II  TM Distance: >3 FB Neck ROM: Full    Dental no notable dental hx. (+) Poor Dentition,    Pulmonary Current Smoker,    Pulmonary exam normal breath sounds clear to auscultation       Cardiovascular hypertension, Pt. on medications Normal cardiovascular exam Rhythm:Regular Rate:Normal     Neuro/Psych  Neuromuscular disease    GI/Hepatic (+) Hepatitis -  Endo/Other    Renal/GU      Musculoskeletal  (+) Arthritis ,   Abdominal (+) + obese,   Peds  Hematology   Anesthesia Other Findings All Percocet  Reproductive/Obstetrics                           Anesthesia Physical Anesthesia Plan  ASA: III  Anesthesia Plan: MAC   Post-op Pain Management:    Induction:   PONV Risk Score and Plan: Treatment may vary due to age or medical condition  Airway Management Planned: Natural Airway  Additional Equipment: None  Intra-op Plan:   Post-operative Plan:   Informed Consent:     Dental advisory given  Plan Discussed with:   Anesthesia Plan Comments: (Duodendenal adenoma for EGD)       Anesthesia Quick Evaluation

## 2020-09-03 ENCOUNTER — Other Ambulatory Visit (HOSPITAL_COMMUNITY): Payer: Self-pay | Admitting: Gastroenterology

## 2020-09-03 ENCOUNTER — Ambulatory Visit (HOSPITAL_COMMUNITY)
Admission: RE | Admit: 2020-09-03 | Discharge: 2020-09-03 | Disposition: A | Discharge status: Home / Self Care | Payer: 59 | Attending: Gastroenterology | Admitting: Gastroenterology

## 2020-09-03 ENCOUNTER — Ambulatory Visit (HOSPITAL_COMMUNITY): Payer: 59 | Admitting: Certified Registered Nurse Anesthetist

## 2020-09-03 ENCOUNTER — Other Ambulatory Visit: Payer: Self-pay

## 2020-09-03 ENCOUNTER — Encounter (HOSPITAL_COMMUNITY): Payer: Self-pay | Admitting: Gastroenterology

## 2020-09-03 ENCOUNTER — Encounter (HOSPITAL_COMMUNITY)
Admission: RE | Disposition: A | Discharge status: Home / Self Care | Payer: Self-pay | Source: Home / Self Care | Attending: Gastroenterology

## 2020-09-03 DIAGNOSIS — K2289 Other specified disease of esophagus: Secondary | ICD-10-CM | POA: Diagnosis not present

## 2020-09-03 DIAGNOSIS — K2970 Gastritis, unspecified, without bleeding: Secondary | ICD-10-CM | POA: Insufficient documentation

## 2020-09-03 DIAGNOSIS — Z8249 Family history of ischemic heart disease and other diseases of the circulatory system: Secondary | ICD-10-CM | POA: Insufficient documentation

## 2020-09-03 DIAGNOSIS — Z8349 Family history of other endocrine, nutritional and metabolic diseases: Secondary | ICD-10-CM | POA: Diagnosis not present

## 2020-09-03 DIAGNOSIS — K2950 Unspecified chronic gastritis without bleeding: Secondary | ICD-10-CM | POA: Diagnosis not present

## 2020-09-03 DIAGNOSIS — Z885 Allergy status to narcotic agent status: Secondary | ICD-10-CM | POA: Insufficient documentation

## 2020-09-03 DIAGNOSIS — K3189 Other diseases of stomach and duodenum: Secondary | ICD-10-CM | POA: Diagnosis not present

## 2020-09-03 DIAGNOSIS — K317 Polyp of stomach and duodenum: Secondary | ICD-10-CM | POA: Insufficient documentation

## 2020-09-03 DIAGNOSIS — K209 Esophagitis, unspecified without bleeding: Secondary | ICD-10-CM | POA: Diagnosis not present

## 2020-09-03 DIAGNOSIS — F17210 Nicotine dependence, cigarettes, uncomplicated: Secondary | ICD-10-CM | POA: Insufficient documentation

## 2020-09-03 DIAGNOSIS — Z09 Encounter for follow-up examination after completed treatment for conditions other than malignant neoplasm: Secondary | ICD-10-CM | POA: Insufficient documentation

## 2020-09-03 DIAGNOSIS — Z86718 Personal history of other venous thrombosis and embolism: Secondary | ICD-10-CM | POA: Insufficient documentation

## 2020-09-03 DIAGNOSIS — Z8616 Personal history of COVID-19: Secondary | ICD-10-CM | POA: Diagnosis not present

## 2020-09-03 DIAGNOSIS — R857 Abnormal histological findings in specimens from digestive organs and abdominal cavity: Secondary | ICD-10-CM | POA: Diagnosis not present

## 2020-09-03 DIAGNOSIS — K449 Diaphragmatic hernia without obstruction or gangrene: Secondary | ICD-10-CM | POA: Diagnosis not present

## 2020-09-03 DIAGNOSIS — D132 Benign neoplasm of duodenum: Secondary | ICD-10-CM

## 2020-09-03 DIAGNOSIS — K2980 Duodenitis without bleeding: Secondary | ICD-10-CM | POA: Diagnosis not present

## 2020-09-03 HISTORY — PX: ESOPHAGOGASTRODUODENOSCOPY (EGD) WITH PROPOFOL: SHX5813

## 2020-09-03 HISTORY — PX: BIOPSY: SHX5522

## 2020-09-03 SURGERY — ESOPHAGOGASTRODUODENOSCOPY (EGD) WITH PROPOFOL
Anesthesia: Monitor Anesthesia Care

## 2020-09-03 MED ORDER — SODIUM CHLORIDE 0.9 % IV SOLN: INTRAVENOUS | Status: DC

## 2020-09-03 MED ORDER — LIDOCAINE 2% (20 MG/ML) 5 ML SYRINGE
INTRAMUSCULAR | Status: DC | PRN
  Administered 2020-09-03: 60 mg via INTRAVENOUS

## 2020-09-03 MED ORDER — PROPOFOL 500 MG/50ML IV EMUL
INTRAVENOUS | Status: AC
  Filled 2020-09-03: qty 50

## 2020-09-03 MED ORDER — LACTATED RINGERS IV SOLN
INTRAVENOUS | Status: DC
  Administered 2020-09-03: 1000 mL via INTRAVENOUS

## 2020-09-03 MED ORDER — OMEPRAZOLE 40 MG PO CPDR: 40.0000 mg | DELAYED_RELEASE_CAPSULE | Freq: Two times a day (BID) | ORAL | 3 refills | Status: DC

## 2020-09-03 MED ORDER — PROPOFOL 10 MG/ML IV BOLUS
INTRAVENOUS | Status: DC | PRN
  Administered 2020-09-03: 20 mg via INTRAVENOUS
  Administered 2020-09-03: 30 mg via INTRAVENOUS
  Administered 2020-09-03: 20 mg via INTRAVENOUS
  Administered 2020-09-03 (×2): 30 mg via INTRAVENOUS

## 2020-09-03 MED ORDER — PROPOFOL 500 MG/50ML IV EMUL
INTRAVENOUS | Status: DC | PRN
  Administered 2020-09-03: 100 ug/kg/min via INTRAVENOUS

## 2020-09-03 MED FILL — OMEPRAZOLE 40 MG CPDR: 40 | 30 days supply | Qty: 60 | Fill #0

## 2020-09-03 SURGICAL SUPPLY — 14 items

## 2020-09-03 NOTE — Anesthesia Postprocedure Evaluation (Signed)
Anesthesia Post Note  Patient: Kimberly Pierce  Procedure(s) Performed: ESOPHAGOGASTRODUODENOSCOPY (EGD) WITH PROPOFOL (N/A ) BIOPSY     Patient location during evaluation: Endoscopy Anesthesia Type: MAC Level of consciousness: awake and alert Pain management: pain level controlled Vital Signs Assessment: post-procedure vital signs reviewed and stable Respiratory status: spontaneous breathing, nonlabored ventilation, respiratory function stable and patient connected to nasal cannula oxygen Cardiovascular status: blood pressure returned to baseline and stable Postop Assessment: no apparent nausea or vomiting Anesthetic complications: no   No complications documented.  Last Vitals:  Vitals:   09/03/20 0932 09/03/20 1031  BP: (!) 210/83 (!) 174/84  Pulse: 62   Resp: 20 19  Temp: 36.6 C 36.5 C  SpO2: 100% 100%    Last Pain:  Vitals:   09/03/20 1031  TempSrc: Oral  PainSc: 0-No pain                 Barnet Glasgow

## 2020-09-03 NOTE — Transfer of Care (Signed)
Immediate Anesthesia Transfer of Care Note  Patient: Kimberly Pierce  Procedure(s) Performed: ESOPHAGOGASTRODUODENOSCOPY (EGD) WITH PROPOFOL (N/A ) BIOPSY  Patient Location: Endoscopy Unit  Anesthesia Type:MAC  Level of Consciousness: awake, alert  and oriented  Airway & Oxygen Therapy: Patient Spontanous Breathing and Patient connected to face mask oxygen  Post-op Assessment: Report given to RN and Post -op Vital signs reviewed and stable  Post vital signs: Reviewed and stable  Last Vitals:  Vitals Value Taken Time  BP 185/74   Temp    Pulse 65 09/03/20 1031  Resp 21 09/03/20 1031  SpO2 100 % 09/03/20 1031  Vitals shown include unvalidated device data.  Last Pain:  Vitals:   09/03/20 0932  TempSrc: Oral  PainSc: 0-No pain         Complications: No complications documented.

## 2020-09-03 NOTE — Discharge Instructions (Signed)
YOU HAD AN ENDOSCOPIC PROCEDURE TODAY: Refer to the procedure report and other information in the discharge instructions given to you for any specific questions about what was found during the examination. If this information does not answer your questions, please call [place] office at [phone] to clarify.   YOU SHOULD EXPECT: Some feelings of bloating in the abdomen. Passage of more gas than usual. Walking can help get rid of the air that was put into your GI tract during the procedure and reduce the bloating. If you had a lower endoscopy (such as a colonoscopy or flexible sigmoidoscopy) you may notice spotting of blood in your stool or on the toilet paper. Some abdominal soreness may be present for a day or two, also.  DIET: Your first meal following the procedure should be a light meal and then it is ok to progress to your normal diet. A half-sandwich or bowl of soup is an example of a good first meal. Heavy or fried foods are harder to digest and may make you feel nauseous or bloated. Drink plenty of fluids but you should avoid alcoholic beverages for 24 hours. If you had a esophageal dilation, please see attached instructions for diet.    ACTIVITY: Your care partner should take you home directly after the procedure. You should plan to take it easy, moving slowly for the rest of the day. You can resume normal activity the day after the procedure however YOU SHOULD NOT DRIVE, use power tools, machinery or perform tasks that involve climbing or major physical exertion for 24 hours (because of the sedation medicines used during the test).   SYMPTOMS TO REPORT IMMEDIATELY: A gastroenterologist can be reached at any hour. Please call [phone]  for any of the following symptoms:   Following upper endoscopy (EGD, EUS, ERCP, esophageal dilation) Vomiting of blood or coffee ground material  New, significant abdominal pain  New, significant chest pain or pain under the shoulder blades  Painful or  persistently difficult swallowing  New shortness of breath  Black, tarry-looking or red, bloody stools  FOLLOW UP:  If any biopsies were taken you will be contacted by phone or by letter within the next 1-3 weeks. Call [phone]  if you have not heard about the biopsies in 3 weeks.  Please also call with any specific questions about appointments or follow up tests.  

## 2020-09-03 NOTE — Op Note (Addendum)
[facility] Patient Name: Kimberly Pierce Procedure Date: 09/03/2020 MRN: [id number] Attending MD: Justice Britain , MD Date of Birth: 10-08-1955 CSN: [id number] Age: 65 Admit Type: Outpatient Procedure:                Upper GI endoscopy Indications:              Surveillance procedure (Duodenal Adenoma Piecemeal                            EMR in 2021 - here for follow up), Polyps in the                            duodenum, Follow-up of polyps in the duodenum Providers:                Justice Britain, MD, Jeanella Cara, RN,                            Josie Dixon, RN, Laverda Sorenson, Technician Referring MD:             Lajuan Lines. Hilarie Fredrickson, MD, Janith Lima, MD Medicines:                Monitored Anesthesia Care Complications:            No immediate complications. Estimated Blood Loss:     Estimated blood loss was minimal. Procedure:                Pre-Anesthesia Assessment:                           - Prior to the procedure, a History and Physical                            was performed, and patient medications and                            allergies were reviewed. The patient's tolerance of                            previous anesthesia was also reviewed. The risks                            and benefits of the procedure and the sedation                            options and risks were discussed with the patient.                            All questions were answered, and informed consent                            was obtained. Prior Anticoagulants: The patient has                            taken no previous anticoagulant or antiplatelet  agents except for aspirin. ASA Grade Assessment: II                            - A patient with mild systemic disease. After                            reviewing the risks and benefits, the patient was                            deemed in satisfactory condition to undergo the                             procedure.                           After obtaining informed consent, the endoscope was                            passed under direct vision. Throughout the                            procedure, the patient's blood pressure, pulse, and                            oxygen saturations were monitored continuously. The                            [id number]-[id number] ([id number]) Olympus therapeutic endoscope                            was introduced through the mouth, and advanced to                            the second part of duodenum. The upper GI endoscopy                            was accomplished without difficulty. The patient                            tolerated the procedure. Scope In: Scope Out: Findings:      No gross lesions were noted in the entire esophagus.      The Z-line was irregular and was found 36 cm from the incisors.      LA Grade A (one or more mucosal breaks less than 5 mm, not extending       between tops of 2 mucosal folds) esophagitis with no bleeding was found       at the gastroesophageal junction.      A 2 cm hiatal hernia was present.      Patchy moderate inflammation characterized by congestion (edema),       erosions, erythema and friability was found in the gastric body and in       the gastric antrum. Biopsies were taken with a cold forceps for       histology and Helicobacter pylori testing.      A medium  post mucosectomy scar was found in the second portion of the       duodenum (contralateral from previously placed tattoo). There was some       polypoid tissue in region but under NBI, it was not clear that this was       recurrence but due to previous large resection this was biopsied with a       cold snare for histology.      No other gross lesions were noted in the duodenal bulb, in the first       portion of the duodenum and in the second portion of the duodenum. Impression:               - No gross lesions in esophagus. Z-line irregular,                             36 cm from the incisors.                           - LA Grade A esophagitis with no bleeding.                           - 2 cm hiatal hernia.                           - Gastritis. Biopsied.                           - Duodenal scar with polypoid tissue - biopsied to                            ensure recurrence is not present.                           - No other gross lesions in the duodenal bulb, in                            the first portion of the duodenum and in the second                            portion of the duodenum. Moderate Sedation:      Not Applicable - Patient had care per Anesthesia. Recommendation:           - The patient will be observed post-procedure,                            until all discharge criteria are met.                           - Discharge patient to home.                           - Patient has a contact number available for                            emergencies. The signs and symptoms of potential  delayed complications were discussed with the                            patient. Return to normal activities tomorrow.                            Written discharge instructions were provided to the                            patient.                           - Resume regular diet.                           - Omeprazole 40 mg twice daily for 1 month and then                            once daily for 1 month then would recommend                            continuing due to significant gastritis noted                            today, but will defer that to patient's primary GI.                           - No ibuprofen, naproxen, or other non-steroidal                            anti-inflammatory drugs for 2 weeks.                           - Continue present medications otherwise.                           - Await pathology results.                           - If evidence of adenomatous tissue is present in                             final pathology, then recommend repeat upper                            endoscopy in 1 year for surveillance (will have                            Endorotor available vs Hot forcep avulsion vs APC).                            If no evidence of adenomatous tissue, then may                            follow  up surveillance EGD in 2-3 years with                            primary GI (Dr. Hilarie Fredrickson).                           - The findings and recommendations were discussed                            with the patient.                           - The findings and recommendations were discussed                            with the designated responsible adult. Procedure Code(s):        --- Professional ---                           [phone], Esophagogastroduodenoscopy, flexible,                            transoral; with removal of tumor(s), polyp(s), or                            other lesion(s) by snare technique Diagnosis Code(s):        --- Professional ---                           K22.8, Other specified diseases of esophagus                           K20.90, Esophagitis, unspecified without bleeding                           K44.9, Diaphragmatic hernia without obstruction or                            gangrene                           K29.70, Gastritis, unspecified, without bleeding                           K31.89, Other diseases of stomach and duodenum                           K31.7, Polyp of stomach and duodenum CPT copyright 2019 American Medical Association. All rights reserved. The codes documented in this report are preliminary and upon coder review may  be revised to meet current compliance requirements. Justice Britain, MD 09/03/2020 10:39:01 AM Number of Addenda: 0

## 2020-09-03 NOTE — H&P (Addendum)
GASTROENTEROLOGY PROCEDURE H&P NOTE   Primary Care Physician: Janith Lima, MD  HPI: Kimberly Pierce is a 65 y.o. female who presents for EGD for follow up of 2021 EMR of Duodenal TA.  Past Medical History:  Diagnosis Date  . Adenomatous duodenal polyp   . Autoimmune hepatitis ([place])   . DVT (deep venous thrombosis) ([place])   . Elevated LFTs   . H/O seasonal allergies   . Hiatal hernia   . Hypertension   . Non-alcoholic cirrhosis ([place])   . Pelvic fracture [facility])    Past Surgical History:  Procedure Laterality Date  . ABDOMINAL HYSTERECTOMY    . ENDOSCOPIC MUCOSAL RESECTION N/A 01/16/2020   Procedure: ENDOSCOPIC MUCOSAL RESECTION;  Surgeon: Rush Landmark Telford Nab., MD;  Location: [place];  Service: Gastroenterology;  Laterality: N/A;  . ESOPHAGOGASTRODUODENOSCOPY (EGD) WITH PROPOFOL N/A 01/16/2020   Procedure: ESOPHAGOGASTRODUODENOSCOPY (EGD) WITH PROPOFOL;  Surgeon: Rush Landmark Telford Nab., MD;  Location: [place];  Service: Gastroenterology;  Laterality: N/A;  . HEMOSTASIS CLIP PLACEMENT  01/16/2020   Procedure: HEMOSTASIS CLIP PLACEMENT;  Surgeon: Irving Copas., MD;  Location: [place];  Service: Gastroenterology;;  . POLYPECTOMY  01/16/2020   Procedure: POLYPECTOMY;  Surgeon: Irving Copas., MD;  Location: [place];  Service: Gastroenterology;;  . [place] LIFTING INJECTION  01/16/2020   Procedure: SUBMUCOSAL LIFTING INJECTION;  Surgeon: Irving Copas., MD;  Location: [place];  Service: Gastroenterology;;  . [place] INJECTION  01/16/2020   Procedure: SUBMUCOSAL TATTOO INJECTION;  Surgeon: Irving Copas., MD;  Location: [place];  Service: Gastroenterology;;   Current Facility-Administered Medications  Medication Dose Route Frequency Provider Last Rate Last Admin  . 0.9 %  sodium chloride infusion   Intravenous Continuous Mansouraty, Telford Nab., MD       Allergies  Allergen Reactions  . Percocet  [Oxycodone-Acetaminophen] Rash    Rash with Percocet Rxed in ER 08/28/14    Family History  Problem Relation Age of Onset  . Heart attack Father   . Heart attack Mother   . Alcohol abuse Other   . Drug abuse Other   . Hypertension Other   . Thyroid disease Other   . Colon cancer Neg Hx    Social History   Socioeconomic History  . Marital status: Legally Separated    Spouse name: Not on file  . Number of children: 3  . Years of education: Not on file  . Highest education level: Not on file  Occupational History  . Not on file  Tobacco Use  . Smoking status: Current Some Day Smoker    Types: Cigarettes  . Smokeless tobacco: Never Used  Vaping Use  . Vaping Use: Never used  Substance and Sexual Activity  . Alcohol use: Yes    Comment: occaisional  . Drug use: No  . Sexual activity: Yes    Birth control/protection: Surgical  Other Topics Concern  . Not on file  Social History Narrative  . Not on file   Social Determinants of Health   Financial Resource Strain: Not on file  Food Insecurity: Not on file  Transportation Needs: Not on file  Physical Activity: Not on file  Stress: Not on file  Social Connections: Not on file  Intimate Partner Violence: Not on file    Physical Exam: Vital signs in last 24 hours:     GEN: NAD EYE: Sclerae anicteric ENT: MMM CV: Non-tachycardic GI: Soft, NT/ND NEURO:  Alert & Oriented x 3  Lab Results:  No results for input(s): WBC, HGB, HCT, PLT in the last 72 hours. BMET No results for input(s): NA, K, CL, CO2, GLUCOSE, BUN, CREATININE, CALCIUM in the last 72 hours. LFT No results for input(s): PROT, ALBUMIN, AST, ALT, ALKPHOS, BILITOT, BILIDIR, IBILI in the last 72 hours. PT/INR No results for input(s): LABPROT, INR in the last 72 hours.   Impression / Plan: This is a 65 y.o.female who presents for EGD for follow up of 2021 EMR of Duodenal TA.  The risks and benefits of endoscopic evaluation were discussed with the  patient; these include but are not limited to the risk of perforation, infection, bleeding, missed lesions, lack of diagnosis, severe illness requiring hospitalization, as well as anesthesia and sedation related illnesses.  The patient is agreeable to proceed.    Patient had work exposure in 2/22 that led to a Positive COVID test.  Unfortunately, this test was not done at a [facility] facility so the results of this were not available for upload.  With this being said, as a result of that test we cancelled her 2/22 EGD and rescheduled for today.  No documentation other than phone note is present at this time from when we had cancelled her last procedure.  Appreciate the Endoscopy Staff/Team and Anesthesia for OK to proceed with planned EGD in setting of not having [place] test in the system.  We will work to minimize this risk of not having true documentation in the chart again.    Justice Britain, MD [facility] Office # [phone]

## 2020-09-04 ENCOUNTER — Encounter: Payer: Self-pay | Admitting: Gastroenterology

## 2020-09-04 LAB — SURGICAL PATHOLOGY

## 2020-09-05 ENCOUNTER — Encounter (HOSPITAL_COMMUNITY): Payer: Self-pay | Admitting: Gastroenterology

## 2020-09-06 ENCOUNTER — Other Ambulatory Visit (HOSPITAL_COMMUNITY): Payer: Self-pay

## 2020-09-12 ENCOUNTER — Other Ambulatory Visit (HOSPITAL_COMMUNITY): Payer: Self-pay

## 2020-09-18 ENCOUNTER — Other Ambulatory Visit (HOSPITAL_COMMUNITY): Payer: Self-pay

## 2020-09-18 MED FILL — Denosumab Inj Soln Prefilled Syringe 60 MG/ML: SUBCUTANEOUS | 180 days supply | Qty: 1 | Fill #0 | Status: CN

## 2020-09-20 ENCOUNTER — Encounter: Payer: Self-pay | Admitting: *Deleted

## 2020-10-02 ENCOUNTER — Ambulatory Visit (INDEPENDENT_AMBULATORY_CARE_PROVIDER_SITE_OTHER): Payer: 59 | Admitting: Internal Medicine

## 2020-10-02 ENCOUNTER — Encounter: Payer: Self-pay | Admitting: Internal Medicine

## 2020-10-02 ENCOUNTER — Other Ambulatory Visit (HOSPITAL_COMMUNITY): Payer: Self-pay

## 2020-10-02 VITALS — BP 160/88 | HR 60 | Ht 66.0 in | Wt 206.2 lb

## 2020-10-02 DIAGNOSIS — K2100 Gastro-esophageal reflux disease with esophagitis, without bleeding: Secondary | ICD-10-CM | POA: Diagnosis not present

## 2020-10-02 DIAGNOSIS — K7460 Unspecified cirrhosis of liver: Secondary | ICD-10-CM

## 2020-10-02 DIAGNOSIS — K754 Autoimmune hepatitis: Secondary | ICD-10-CM

## 2020-10-02 DIAGNOSIS — D132 Benign neoplasm of duodenum: Secondary | ICD-10-CM

## 2020-10-02 MED ORDER — AZATHIOPRINE 50 MG PO TABS
ORAL_TABLET | Freq: Every day | ORAL | 2 refills | Status: DC
  Filled 2020-10-02: qty 30, 30d supply, fill #0

## 2020-10-02 MED ORDER — OMEPRAZOLE 40 MG PO CPDR
40.0000 mg | DELAYED_RELEASE_CAPSULE | Freq: Every day | ORAL | 1 refills | Status: DC
  Filled 2020-10-02 – 2020-10-09 (×2): qty 90, 90d supply, fill #0

## 2020-10-02 MED ORDER — BUDESONIDE 3 MG PO CPEP
6.0000 mg | ORAL_CAPSULE | Freq: Every day | ORAL | 0 refills | Status: DC
  Filled 2020-10-02: qty 180, 90d supply, fill #0
  Filled 2020-10-05: qty 60, 30d supply, fill #0
  Filled 2020-11-12: qty 60, 30d supply, fill #1
  Filled 2020-12-11: qty 60, 30d supply, fill #2

## 2020-10-02 NOTE — Patient Instructions (Signed)
Please RESTART Azathioprine 50 mg once daily.  Increase Budesonide to 6 mg (2 tablets) once daily.  Decrease omeprazole to 40 mg once daily.  We have sent the following medications to your pharmacy for you to pick up at your convenience: Azathioprine Budesonide Omeprazole  Please purchase the following medications over the counter and take as directed: Miralax 1 capful (17 grams) dissolved in 8 ounces water/juice once daily AS needed for constipation.  Please follow up with Dr Hilarie Fredrickson 6 months in the office.  You will be due for an abdominal ultrasound in July 2022. We will contact you with an appointment date and time when it gets closer.  You will be due for labs (hepatic function panel and CBC) in July. We will call you to remind you of this when it gets closer to that time.  If you are age 66 or older, your body mass index should be between 23-30. Your Body mass index is 33.28 kg/m. If this is out of the aforementioned range listed, please consider follow up with your Primary Care Provider.  If you are age 66 or younger, your body mass index should be between 19-25. Your Body mass index is 33.28 kg/m. If this is out of the aformentioned range listed, please consider follow up with your Primary Care Provider.   Due to recent changes in healthcare laws, you may see the results of your imaging and laboratory studies on MyChart before your provider has had a chance to review them.  We understand that in some cases there may be results that are confusing or concerning to you. Not all laboratory results come back in the same time frame and the provider may be waiting for multiple results in order to interpret others.  Please give [place] 48 hours in order for your provider to thoroughly review all the results before contacting the office for clarification of your results.

## 2020-10-02 NOTE — Progress Notes (Signed)
Subjective:    Patient ID: Kimberly Pierce, female    DOB: 1955/09/27, 65 y.o.   MRN: [id number]  HPI Kimberly Pierce is a 65 year old female with a history of autoimmune hepatitis with cirrhosis, duodenal adenoma, GERD with esophagitis, gastritis, hypertension, hyperlipidemia and remote DVT who is here for follow-up.  She is here alone today.  I last saw her in January 2022.  She had a recent surveillance upper endoscopy with Dr. Rush Landmark after having EMR of a duodenal adenoma.  This revealed some mild reflux esophagitis, H. pylori negative gastritis felt to be related to NSAIDs, and some inflammatory reaction with possible atypia from snare biopsy samples at previous duodenal EMR site.  Dr. Rush Landmark placed her on twice daily PPI for a month and recommended a 1 year surveillance upper endoscopy.  She did well with this procedure.  She had been taking budesonide 6 mg daily for her AIH and we added azathioprine.  When her liver enzymes normalized we reduced to budesonide to 3 mg which is what she is taking now.  She had 1 day where she had 2 episodes of vomiting without abdominal pain.  She called the office because I told her to do so after starting azathioprine if she had any upper GI symptoms.  We stopped azathioprine but she is no longer certain that this was related.  She had taken the azathioprine for nearly a month and this vomiting episode was isolated.  Today she reports she is feeling well.  No nausea or vomiting.  No abdominal pain.  No increasing abdominal swelling or lower extremity swelling.  No itching.  Good energy levels.  Bowels regular without blood or melena.  She submitted another Cologuard in February which was negative.  she had done one 3 years previously.   Review of Systems As per HPI, otherwise negative  Current Medications, Allergies, Past Medical History, Past Surgical History, Family History and Social History were reviewed in [employer]  record.     Objective:   Physical Exam BP (!) 160/88   Pulse 60   Ht _0  (1.676 m)   Wt 206 lb 3.2 oz (93.5 kg)   BMI 33.28 kg/m  Gen: awake, alert, NAD HEENT: anicteric CV: RRR, no mrg Pulm: CTA b/l Abd: soft, NT/ND, +BS throughout Ext: no c/c/e Neuro: nonfocal, no asterixis  CMP     Component Value Date/Time   NA 137 08/28/2020 1647   K 4.1 08/28/2020 1647   CL 105 08/28/2020 1647   CO2 22 08/28/2020 1647   GLUCOSE 117 (H) 08/28/2020 1647   BUN 19 08/28/2020 1647   CREATININE 0.73 08/28/2020 1647   CALCIUM 10.0 08/28/2020 1647   CALCIUM 10.2 05/05/2011 1417   PROT 7.9 08/28/2020 1647   PROT 7.9 08/28/2020 1647   ALBUMIN 4.7 08/28/2020 1647   ALBUMIN 4.7 08/28/2020 1647   AST 40 (H) 08/28/2020 1647   AST 40 (H) 08/28/2020 1647   ALT 35 08/28/2020 1647   ALT 35 08/28/2020 1647   ALKPHOS 155 (H) 08/28/2020 1647   ALKPHOS 155 (H) 08/28/2020 1647   BILITOT 0.9 08/28/2020 1647   BILITOT 0.9 08/28/2020 1647   GFRNONAA >60 03/06/2020 0207   GFRAA >60 03/06/2020 0207   Lab Results  Component Value Date   INR 1.1 (H) 08/28/2020   INR 1.1 (H) 07/26/2020   INR 1.1 (H) 06/19/2020   CBC    Component Value Date/Time   WBC 4.8 08/28/2020 1647   RBC  4.12 08/28/2020 1647   HGB 13.9 08/28/2020 1647   HCT 41.0 08/28/2020 1647   PLT 198.0 08/28/2020 1647   MCV 99.4 08/28/2020 1647   MCH 32.4 03/06/2020 0207   MCHC 34.0 08/28/2020 1647   RDW 14.3 08/28/2020 1647   LYMPHSABS 1.2 08/28/2020 1647   MONOABS 0.4 08/28/2020 1647   EOSABS 0.1 08/28/2020 1647   BASOSABS 0.0 08/28/2020 1647        Assessment & Plan:  65 year old female with a history of autoimmune hepatitis with cirrhosis, duodenal adenoma, GERD with esophagitis, gastritis, hypertension, hyperlipidemia and remote DVT who is here for follow-up.  1. AIH with cirrhosis --we had normalized her liver enzymes with budesonide at 6 mg and we started azathioprine.  She had an isolated day where she had vomiting  and we stopped azathioprine with concerns of these 2 events may be related.  We also reduce budesonide to 3 mg and when doing so her liver enzymes have become again slightly elevated, specifically her AST and alk phos.  After discussion she does not think that the vomiting was necessarily related to azathioprine and I agree.  I recommended the following: -- Increase budesonide back to 6 mg daily for now; we will taper in the future as the disease allows -- Resume azathioprine 50 mg daily; again she is reminded with this medication to call me should she develop upper abdominal pain, nausea, vomiting or other side effect. -- Up-to-date with variceal screening, varices negative as of very recent upper endoscopy.  She will be having duodenal adenoma surveillance which will suffice for this screening as well -- [place] screening ultrasound due in July -- She is immune to hepatitis a and B by vaccination -- Repeat hepatic enzymes plus CBC in about 8 weeks -- See me in 6 months  2.  Duodenal adenoma --plan surveillance in March 2023 per Dr. Rush Landmark; I appreciate his help with this issue  3.  GERD with mild esophagitis and gastritis --we will reduce her omeprazole from 40 mg twice daily to once daily and plan chronic therapy  -- Continue omeprazole 40 mg once daily  4.  CRC screening --negative Cologuard February 2022; repeat February 2025

## 2020-10-03 ENCOUNTER — Other Ambulatory Visit (HOSPITAL_COMMUNITY): Payer: Self-pay

## 2020-10-04 ENCOUNTER — Other Ambulatory Visit (HOSPITAL_COMMUNITY): Payer: Self-pay

## 2020-10-04 ENCOUNTER — Telehealth: Payer: Self-pay | Admitting: Internal Medicine

## 2020-10-04 NOTE — Telephone Encounter (Signed)
Ok, this was a retrial She should stop it, add to allergy/intolerance list for n/v Continue budesonide 6 mg daily for AIH We have plans to repeat LFTs Thanks JMP

## 2020-10-04 NOTE — Telephone Encounter (Signed)
Inbound call from patient requesting a call from a nurse please.  States Imuran medication is making her vomit.  Please advise.

## 2020-10-04 NOTE — Telephone Encounter (Signed)
Spoke with pt and she is aware. Imuran added to allergy list.

## 2020-10-04 NOTE — Telephone Encounter (Signed)
Pt called and states the Imuran is making her sick. Reports it is causing nausea and vomiting with the Imuran. Please advise.

## 2020-10-05 ENCOUNTER — Other Ambulatory Visit (HOSPITAL_COMMUNITY): Payer: Self-pay

## 2020-10-09 ENCOUNTER — Other Ambulatory Visit (HOSPITAL_COMMUNITY): Payer: Self-pay

## 2020-10-10 ENCOUNTER — Telehealth: Payer: Self-pay | Admitting: Internal Medicine

## 2020-10-10 NOTE — Telephone Encounter (Signed)
    Patient states she is calling to schedule an "injection for her bones"  She states this was discussed with Dr Ronnald Ramp in the past.   Please advise

## 2020-10-11 ENCOUNTER — Ambulatory Visit: Payer: 59

## 2020-10-12 ENCOUNTER — Telehealth: Payer: Self-pay

## 2020-10-12 NOTE — Telephone Encounter (Signed)
Key: [id number]

## 2020-10-17 NOTE — Telephone Encounter (Signed)
Patient scheduled for Prolia on 6.29.22 Please send script to [facility]

## 2020-10-17 NOTE — Telephone Encounter (Signed)
Patient authorized for Prolia  PA Ref# [id number] Efffective dates: 5.6.22 to 5.5.23 Patient also has Copay assistance  Script will be sent to [place]

## 2020-10-17 NOTE — Telephone Encounter (Signed)
Patient scheduled for

## 2020-10-18 ENCOUNTER — Other Ambulatory Visit: Payer: Self-pay

## 2020-10-18 ENCOUNTER — Other Ambulatory Visit: Payer: Self-pay | Admitting: Pharmacist

## 2020-10-18 ENCOUNTER — Other Ambulatory Visit (HOSPITAL_COMMUNITY): Payer: Self-pay

## 2020-10-18 ENCOUNTER — Ambulatory Visit: Payer: 59 | Attending: Internal Medicine | Admitting: Pharmacist

## 2020-10-18 DIAGNOSIS — M8080XA Other osteoporosis with current pathological fracture, unspecified site, initial encounter for fracture: Secondary | ICD-10-CM

## 2020-10-18 DIAGNOSIS — Z79899 Other long term (current) drug therapy: Secondary | ICD-10-CM

## 2020-10-18 MED ORDER — DENOSUMAB 60 MG/ML ~~LOC~~ SOSY
PREFILLED_SYRINGE | SUBCUTANEOUS | 1 refills | Status: AC
  Filled 2020-10-18: qty 1, 180d supply, fill #0
  Filled ????-??-??: fill #0

## 2020-10-18 NOTE — Progress Notes (Signed)
S:  Patient presents for review of their specialty medication therapy.  Patient is currently taking Prolia for osteoporosis. Patient is managed by Dr. Ronnald Ramp for this.   Adherence: confirms  Efficacy: reports that it works well for her   Dosing: 60 mg q73month  Dose adjustments: Renal: Monitor patients with severe impairment (CrCl <30 mL/minute or on dialysis) closely, as significant and prolonged hypocalcemia (incidence of 29% and potentially lasting weeks to months) and marked elevations of serum parathyroid hormone are serious risks in this population. Ensure adequate calcium and vitamin D intake/supplementation. CrCl ?30 mL/minute: No dosage adjustment necessary. CrCl <30 mL/minute: No dosage adjustment necessary; use in conjunction with guidance from patient's nephrology team. Hepatic: no dose adjustments (has not been studied)  Drug-drug interactions: none identified   Screening: TB test: completed, negative (06/21/2019) Hepatitis: completed 05/03/2019  Monitoring: S/sx of infection: denies  S/sx of hypersensitivity: denies  S/sx of hypocalcemia/hypercalcemia: denies  Dermatitis/skin rash: denies  Peripheral edema: denies  HA: denies  GI upset: denies   Other side effects: denies    Last bone density study: no recent one on file   O:   Lab Results  Component Value Date   WBC 4.8 08/28/2020   HGB 13.9 08/28/2020   HCT 41.0 08/28/2020   MCV 99.4 08/28/2020   PLT 198.0 08/28/2020      Chemistry      Component Value Date/Time   NA 137 08/28/2020 1647   K 4.1 08/28/2020 1647   CL 105 08/28/2020 1647   CO2 22 08/28/2020 1647   BUN 19 08/28/2020 1647   CREATININE 0.73 08/28/2020 1647      Component Value Date/Time   CALCIUM 10.0 08/28/2020 1647   CALCIUM 10.2 05/05/2011 1417   ALKPHOS 155 (H) 08/28/2020 1647   ALKPHOS 155 (H) 08/28/2020 1647   AST 40 (H) 08/28/2020 1647   AST 40 (H) 08/28/2020 1647   ALT 35 08/28/2020 1647   ALT 35 08/28/2020 1647    BILITOT 0.9 08/28/2020 1647   BILITOT 0.9 08/28/2020 1647       A/P: 1. Medication review: Patient currently on Prolia for osteoporosis. Reviewed the medication with the patient, including the following: Prolia (denosumab) is a monoclonal antibody with affinity for nuclear factor-kappa ligand (RANKL). Prolia binds to RANKL and prevents osteoclast formation, leading to decreased bone resorption and increased bone mass in osteoporosis. Patient educated on purpose, proper use, and potential adverse effects of Prolia. The most common adverse effects are hypersensitivities, peripheral edema, dermatitis/skin rash, GI upset, HA, joint pain, and infection. There is the possibility of atypical femur fracture, serum calcium disturbances, and osteonecrosis of the jaw. Patients should monitor for and report hip, thigh, or groin pain. Additionally, patients should monitor for and report jaw pain, tooth/periodontal infection, toothache, and/or gingival ulceration/erosion. Prolia exists as a solution prefilled syringe for SQ administration. Administration: Denosumab is intended for SubQ route only and should not be administered IV, IM, or intradermally. Prior to administration, bring to room temperature in original container (allow to stand ~15 to 30 minutes); do not warm by any other method. Solution may contain trace amounts of translucent to white protein particles; do not use if cloudy, discolored (normal solution should be clear and colorless to pale yellow), or contains excessive particles or foreign matter. Avoid vigorous shaking. Administer via SubQ injection in the upper arm, upper thigh, or abdomen; should only be administered by a health care professional. No recommendations for any changes at this time.   Kimberly Pierce  Kimberly Pierce, PharmD, [place], [place] [phone]

## 2020-10-26 ENCOUNTER — Other Ambulatory Visit (INDEPENDENT_AMBULATORY_CARE_PROVIDER_SITE_OTHER): Payer: 59

## 2020-10-26 DIAGNOSIS — K2100 Gastro-esophageal reflux disease with esophagitis, without bleeding: Secondary | ICD-10-CM

## 2020-10-26 DIAGNOSIS — K7460 Unspecified cirrhosis of liver: Secondary | ICD-10-CM

## 2020-10-26 DIAGNOSIS — K754 Autoimmune hepatitis: Secondary | ICD-10-CM | POA: Diagnosis not present

## 2020-10-26 DIAGNOSIS — D132 Benign neoplasm of duodenum: Secondary | ICD-10-CM

## 2020-10-26 LAB — CBC WITH DIFFERENTIAL/PLATELET
Basophils Absolute: 0 10*3/uL (ref 0.0–0.1)
Basophils Relative: 0.6 % (ref 0.0–3.0)
Eosinophils Absolute: 0.1 10*3/uL (ref 0.0–0.7)
Eosinophils Relative: 1.2 % (ref 0.0–5.0)
HCT: 40.9 % (ref 36.0–46.0)
Hemoglobin: 13.8 g/dL (ref 12.0–15.0)
Lymphocytes Relative: 30.8 % (ref 12.0–46.0)
Lymphs Abs: 1.9 10*3/uL (ref 0.7–4.0)
MCHC: 33.8 g/dL (ref 30.0–36.0)
MCV: 98.9 fl (ref 78.0–100.0)
Monocytes Absolute: 0.5 10*3/uL (ref 0.1–1.0)
Monocytes Relative: 8.6 % (ref 3.0–12.0)
Neutro Abs: 3.7 10*3/uL (ref 1.4–7.7)
Neutrophils Relative %: 58.8 % (ref 43.0–77.0)
Platelets: 179 10*3/uL (ref 150.0–400.0)
RBC: 4.13 Mil/uL (ref 3.87–5.11)
RDW: 13.6 % (ref 11.5–15.5)
WBC: 6.3 10*3/uL (ref 4.0–10.5)

## 2020-10-26 LAB — HEPATIC FUNCTION PANEL
ALT: 29 U/L (ref 0–35)
AST: 27 U/L (ref 0–37)
Albumin: 4.9 g/dL (ref 3.5–5.2)
Alkaline Phosphatase: 113 U/L (ref 39–117)
Bilirubin, Direct: 0.2 mg/dL (ref 0.0–0.3)
Total Bilirubin: 1 mg/dL (ref 0.2–1.2)
Total Protein: 8.6 g/dL — ABNORMAL HIGH (ref 6.0–8.3)

## 2020-10-27 ENCOUNTER — Other Ambulatory Visit (HOSPITAL_COMMUNITY): Payer: Self-pay

## 2020-10-28 ENCOUNTER — Other Ambulatory Visit (HOSPITAL_COMMUNITY): Payer: Self-pay

## 2020-10-29 ENCOUNTER — Other Ambulatory Visit: Payer: Self-pay

## 2020-10-29 ENCOUNTER — Other Ambulatory Visit (HOSPITAL_COMMUNITY): Payer: Self-pay

## 2020-10-29 DIAGNOSIS — K754 Autoimmune hepatitis: Secondary | ICD-10-CM

## 2020-11-12 ENCOUNTER — Other Ambulatory Visit (HOSPITAL_COMMUNITY): Payer: Self-pay

## 2020-11-12 MED FILL — Nebivolol HCl Tab 5 MG (Base Equivalent): ORAL | 30 days supply | Qty: 30 | Fill #0 | Status: AC

## 2020-12-04 ENCOUNTER — Ambulatory Visit
Admission: RE | Admit: 2020-12-04 | Discharge: 2020-12-04 | Disposition: A | Discharge status: Home / Self Care | Payer: 59 | Source: Ambulatory Visit | Attending: Internal Medicine | Admitting: Internal Medicine

## 2020-12-04 ENCOUNTER — Other Ambulatory Visit: Payer: Self-pay

## 2020-12-04 DIAGNOSIS — Z1231 Encounter for screening mammogram for malignant neoplasm of breast: Secondary | ICD-10-CM | POA: Diagnosis not present

## 2020-12-05 ENCOUNTER — Ambulatory Visit (INDEPENDENT_AMBULATORY_CARE_PROVIDER_SITE_OTHER): Payer: 59

## 2020-12-05 DIAGNOSIS — M8080XA Other osteoporosis with current pathological fracture, unspecified site, initial encounter for fracture: Secondary | ICD-10-CM

## 2020-12-05 MED ORDER — DENOSUMAB 60 MG/ML ~~LOC~~ SOSY
60.0000 mg | PREFILLED_SYRINGE | Freq: Once | SUBCUTANEOUS | Status: AC
Start: 2020-12-05 — End: 2020-12-05
  Administered 2020-12-05: 60 mg via SUBCUTANEOUS

## 2020-12-05 NOTE — Progress Notes (Signed)
Pt here for Prolia injection per Dr Ronnald Ramp.  Prolia 2m given subcutaneous in right arm and pt tolerated injection well.  Next Prolia injection scheduled for 06/06/21.

## 2020-12-11 ENCOUNTER — Other Ambulatory Visit (HOSPITAL_COMMUNITY): Payer: Self-pay

## 2020-12-17 ENCOUNTER — Other Ambulatory Visit: Payer: Self-pay

## 2020-12-17 DIAGNOSIS — K754 Autoimmune hepatitis: Secondary | ICD-10-CM

## 2020-12-25 ENCOUNTER — Ambulatory Visit (HOSPITAL_COMMUNITY)
Admission: RE | Admit: 2020-12-25 | Discharge: 2020-12-25 | Disposition: A | Discharge status: Home / Self Care | Payer: 59 | Source: Ambulatory Visit | Attending: Internal Medicine | Admitting: Internal Medicine

## 2020-12-25 ENCOUNTER — Other Ambulatory Visit: Payer: Self-pay

## 2020-12-25 DIAGNOSIS — K7460 Unspecified cirrhosis of liver: Secondary | ICD-10-CM | POA: Diagnosis not present

## 2020-12-25 DIAGNOSIS — K754 Autoimmune hepatitis: Secondary | ICD-10-CM | POA: Diagnosis not present

## 2020-12-31 ENCOUNTER — Telehealth: Payer: Self-pay | Admitting: Internal Medicine

## 2020-12-31 NOTE — Telephone Encounter (Signed)
See result note.  

## 2021-01-10 ENCOUNTER — Other Ambulatory Visit: Payer: Self-pay | Admitting: Internal Medicine

## 2021-01-10 ENCOUNTER — Other Ambulatory Visit (HOSPITAL_COMMUNITY): Payer: Self-pay

## 2021-01-10 MED ORDER — BUDESONIDE 3 MG PO CPEP
6.0000 mg | ORAL_CAPSULE | Freq: Every day | ORAL | 0 refills | Status: DC
  Filled 2021-01-10: qty 180, 90d supply, fill #0

## 2021-01-11 ENCOUNTER — Other Ambulatory Visit (HOSPITAL_COMMUNITY): Payer: Self-pay

## 2021-01-14 ENCOUNTER — Telehealth: Payer: Self-pay | Admitting: *Deleted

## 2021-01-14 DIAGNOSIS — K7460 Unspecified cirrhosis of liver: Secondary | ICD-10-CM

## 2021-01-14 DIAGNOSIS — K754 Autoimmune hepatitis: Secondary | ICD-10-CM

## 2021-01-14 NOTE — Telephone Encounter (Signed)
-----   Message from Larina Bras, [place] sent at 12/07/2020  2:50 PM EDT ----- Pt needs labs wk of 01/21/21 (in prep for her 01/28/21 office visit)]. ----- Message ----- From: Jerene Bears, MD Sent: 12/06/2020   5:33 PM EDT To: Larina Bras, CMA  Since her enzymes are getting better but I will recheck it about a week before her follow-up visit with me in August Thanks  ----- Message ----- From: Larina Bras, CMA Sent: 12/06/2020  12:00 AM EDT To: Jerene Bears, MD  I have a reminder note to myself to schedule this patient for u/s for [facility] screen in early August and for her to have cbc and hepatic fx in July (I would just do early August if ok with you). She is also due for office visit early August. HOWEVER, last hx function panel done 10/26/20, you noted she needed repeat 3 months which would put her into later August. Do you want me to put hx CBC and Hx function off until time of office visit when I get one for her or do you want her to go ahead and have it in early August as previously planned?

## 2021-01-14 NOTE — Telephone Encounter (Signed)
Left message for patient reminding her to come for labs week of 01/21/21 in preparation for her upcoming appointment.

## 2021-01-21 ENCOUNTER — Other Ambulatory Visit (INDEPENDENT_AMBULATORY_CARE_PROVIDER_SITE_OTHER): Payer: 59

## 2021-01-21 DIAGNOSIS — K7460 Unspecified cirrhosis of liver: Secondary | ICD-10-CM

## 2021-01-21 DIAGNOSIS — K754 Autoimmune hepatitis: Secondary | ICD-10-CM

## 2021-01-21 LAB — CBC WITH DIFFERENTIAL/PLATELET
Basophils Absolute: 0 10*3/uL (ref 0.0–0.1)
Basophils Relative: 0.3 % (ref 0.0–3.0)
Eosinophils Absolute: 0.1 10*3/uL (ref 0.0–0.7)
Eosinophils Relative: 1.2 % (ref 0.0–5.0)
HCT: 42 % (ref 36.0–46.0)
Hemoglobin: 14 g/dL (ref 12.0–15.0)
Lymphocytes Relative: 21.1 % (ref 12.0–46.0)
Lymphs Abs: 1.7 10*3/uL (ref 0.7–4.0)
MCHC: 33.4 g/dL (ref 30.0–36.0)
MCV: 101 fl — ABNORMAL HIGH (ref 78.0–100.0)
Monocytes Absolute: 0.7 10*3/uL (ref 0.1–1.0)
Monocytes Relative: 8.6 % (ref 3.0–12.0)
Neutro Abs: 5.4 10*3/uL (ref 1.4–7.7)
Neutrophils Relative %: 68.8 % (ref 43.0–77.0)
Platelets: 180 10*3/uL (ref 150.0–400.0)
RBC: 4.16 Mil/uL (ref 3.87–5.11)
RDW: 12.7 % (ref 11.5–15.5)
WBC: 7.9 10*3/uL (ref 4.0–10.5)

## 2021-01-21 LAB — COMPREHENSIVE METABOLIC PANEL
ALT: 42 U/L — ABNORMAL HIGH (ref 0–35)
AST: 37 U/L (ref 0–37)
Albumin: 4.7 g/dL (ref 3.5–5.2)
Alkaline Phosphatase: 89 U/L (ref 39–117)
BUN: 19 mg/dL (ref 6–23)
CO2: 20 mEq/L (ref 19–32)
Calcium: 9.8 mg/dL (ref 8.4–10.5)
Chloride: 105 mEq/L (ref 96–112)
Creatinine, Ser: 0.74 mg/dL (ref 0.40–1.20)
GFR: 84.55 mL/min (ref >60.00)
Glucose, Bld: 107 mg/dL — ABNORMAL HIGH (ref 70–99)
Potassium: 3.7 mEq/L (ref 3.5–5.1)
Sodium: 136 mEq/L (ref 135–145)
Total Bilirubin: 0.7 mg/dL (ref 0.2–1.2)
Total Protein: 8.3 g/dL (ref 6.0–8.3)

## 2021-01-21 LAB — HEPATIC FUNCTION PANEL
ALT: 42 U/L — ABNORMAL HIGH (ref 0–35)
AST: 37 U/L (ref 0–37)
Albumin: 4.7 g/dL (ref 3.5–5.2)
Alkaline Phosphatase: 89 U/L (ref 39–117)
Bilirubin, Direct: 0.1 mg/dL (ref 0.0–0.3)
Total Bilirubin: 0.7 mg/dL (ref 0.2–1.2)
Total Protein: 8.3 g/dL (ref 6.0–8.3)

## 2021-01-23 ENCOUNTER — Other Ambulatory Visit: Payer: Self-pay

## 2021-01-23 DIAGNOSIS — K754 Autoimmune hepatitis: Secondary | ICD-10-CM

## 2021-01-28 ENCOUNTER — Ambulatory Visit: Payer: 59 | Admitting: Internal Medicine

## 2021-03-01 IMAGING — DX DG PELVIS 3+V JUDET
3 series · 3 of 3 positions shown · non-contrast
Comparison: 04/16/2020

CLINICAL DATA: Follow-up fracture

EXAM:
JUDET PELVIS - 3+ VIEW

[pelvis ap]
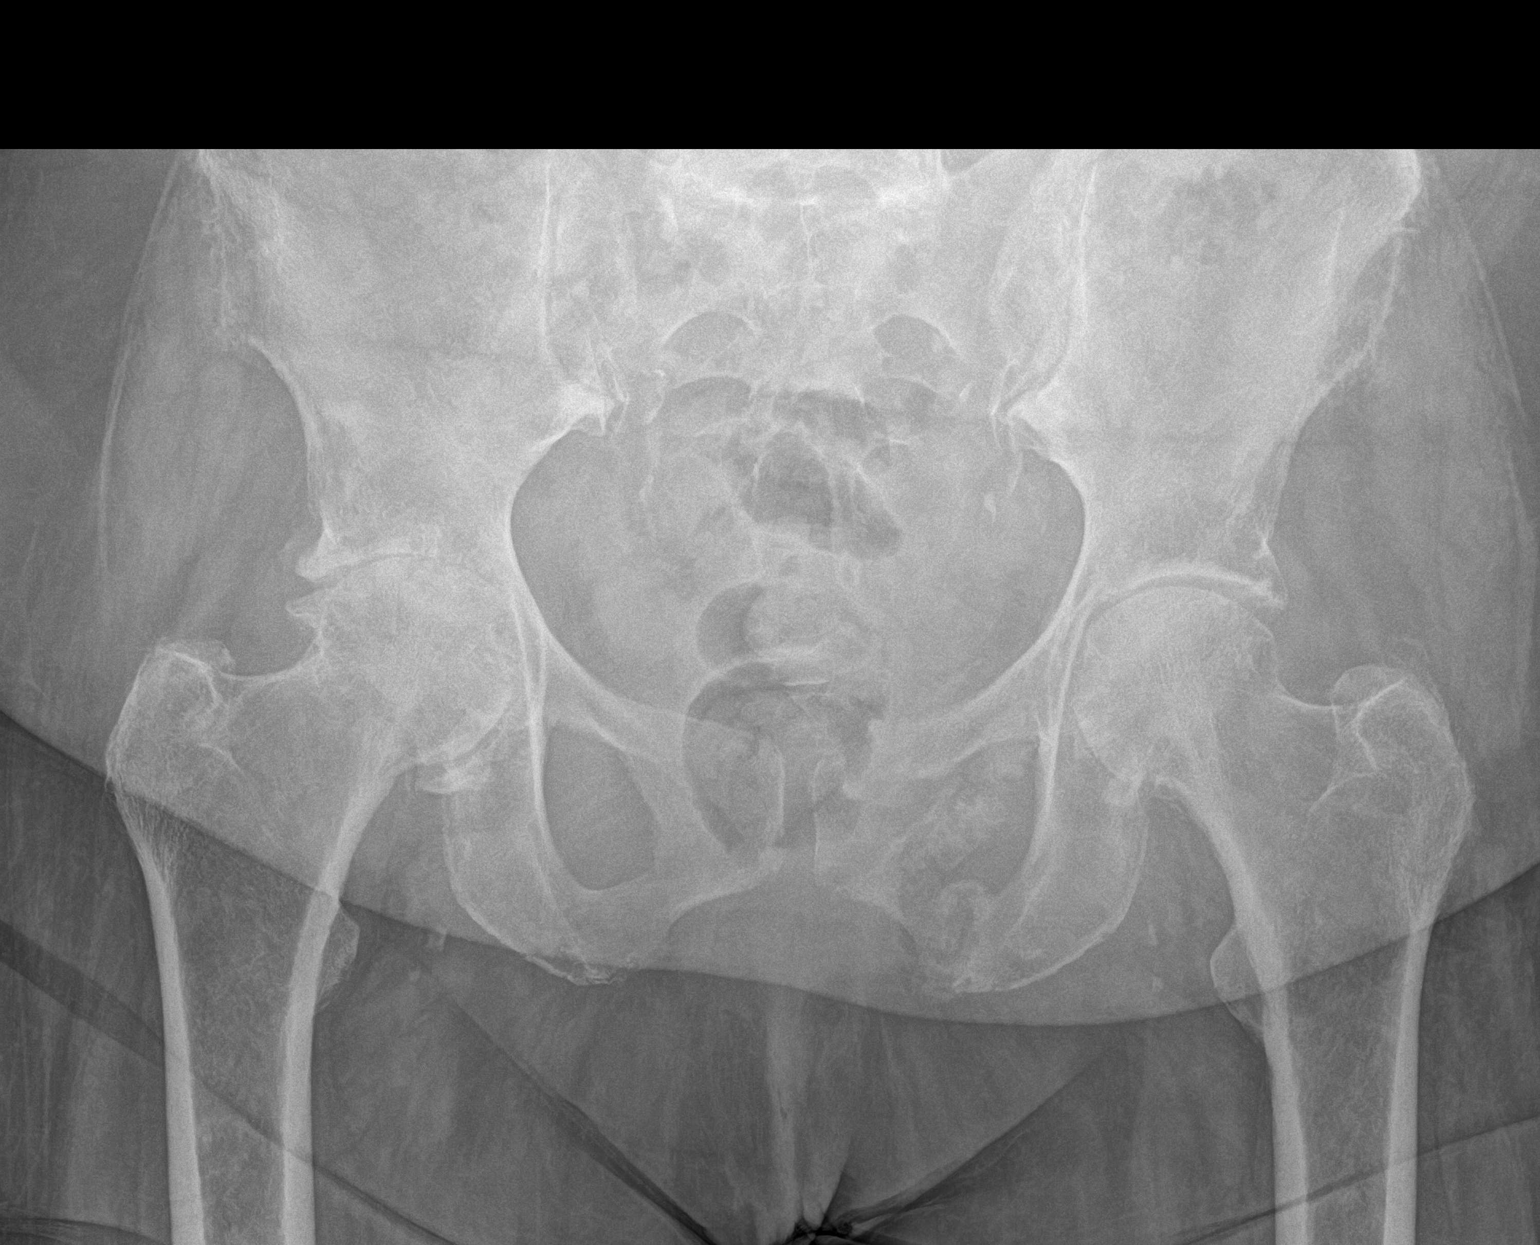

[pelvis inlet]
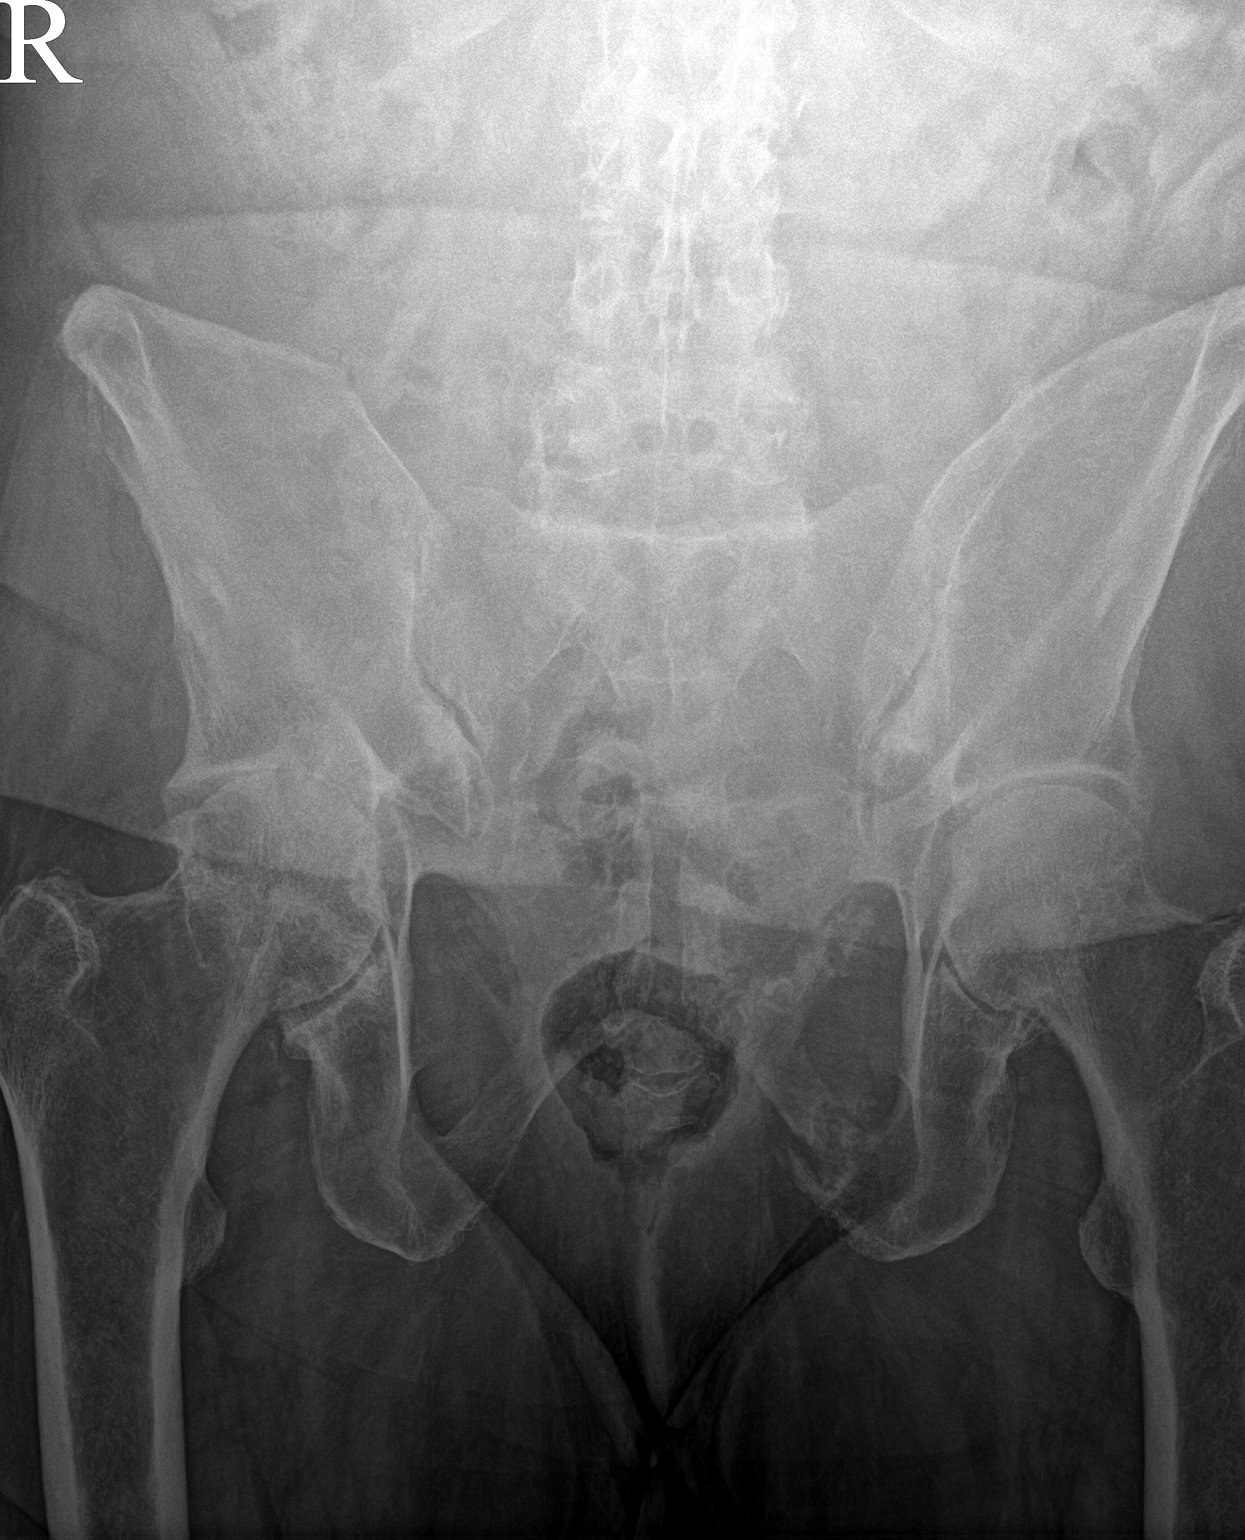

[pelvis outlet]
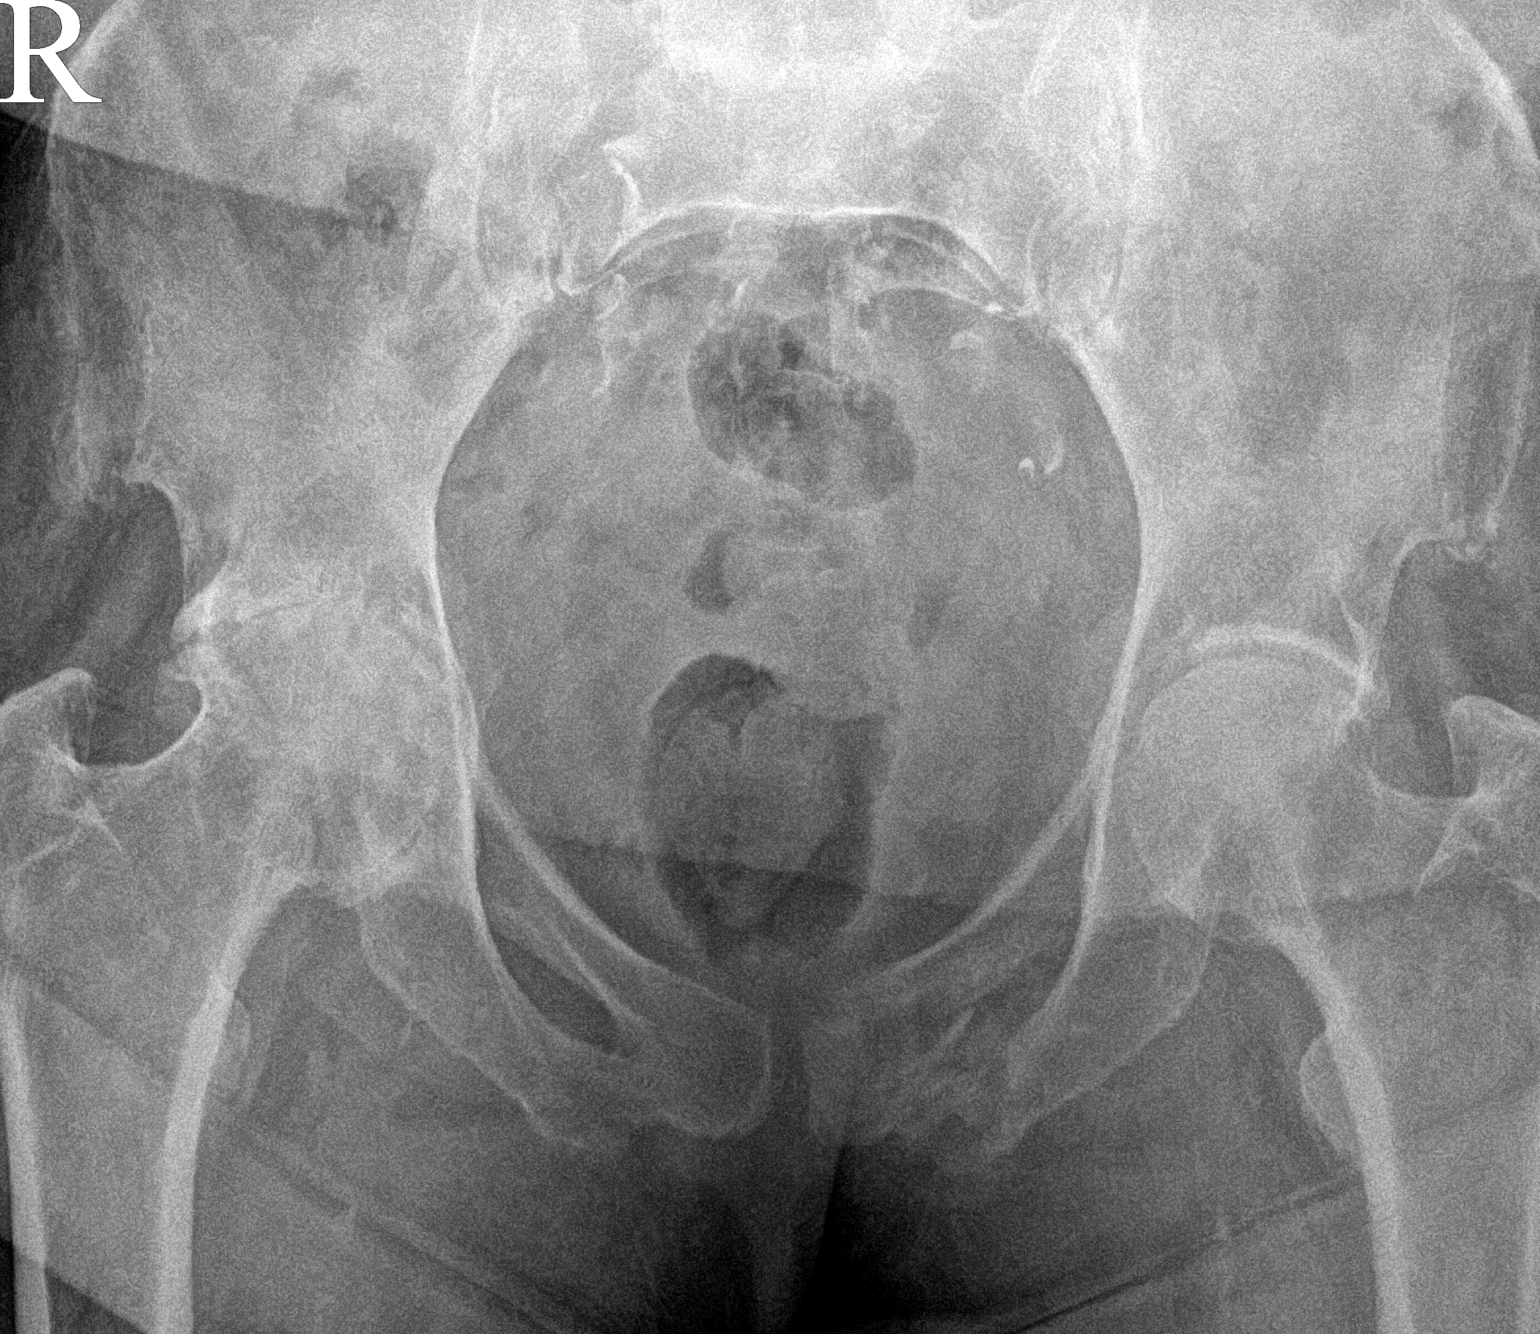

[3 of 3 positions shown; findings below may reference images not displayed]

FINDINGS: Again noted are fractures through the left superior and inferior
pubic rami. Evidence of partial healing with callus formation.
Fracture lines remain evident. Degenerative changes in the hips
bilaterally, right greater than left. No acute fracture, subluxation
or dislocation.
IMPRESSION: Healing left superior and inferior pubic rami fractures. Fracture
lines remain evident.

## 2021-04-18 ENCOUNTER — Other Ambulatory Visit: Payer: Self-pay | Admitting: Internal Medicine

## 2021-04-18 ENCOUNTER — Other Ambulatory Visit (HOSPITAL_COMMUNITY): Payer: Self-pay

## 2021-04-18 ENCOUNTER — Telehealth: Payer: Self-pay | Admitting: Internal Medicine

## 2021-04-18 NOTE — Telephone Encounter (Signed)
Inbound call from pt requesting a call back stating that she is out of refills for Budesonide. Pt has been scheduled for a med refill for 06/26/21. Please advise. Thank you.

## 2021-04-19 ENCOUNTER — Other Ambulatory Visit (HOSPITAL_COMMUNITY): Payer: Self-pay

## 2021-04-19 MED ORDER — BUDESONIDE 3 MG PO CPEP
6.0000 mg | ORAL_CAPSULE | Freq: Every day | ORAL | 0 refills | Status: DC
  Filled 2021-04-19: qty 180, 90d supply, fill #0

## 2021-04-19 NOTE — Telephone Encounter (Signed)
Rx for budesonide sent to pharmacy while patient awaiting scheduled appointment.

## 2021-04-22 ENCOUNTER — Other Ambulatory Visit (HOSPITAL_COMMUNITY): Payer: Self-pay

## 2021-04-23 ENCOUNTER — Other Ambulatory Visit (HOSPITAL_COMMUNITY): Payer: Self-pay

## 2021-04-24 ENCOUNTER — Other Ambulatory Visit: Payer: Self-pay

## 2021-04-24 ENCOUNTER — Other Ambulatory Visit (HOSPITAL_COMMUNITY): Payer: Self-pay

## 2021-04-24 ENCOUNTER — Emergency Department (HOSPITAL_COMMUNITY)
Admission: EM | Admit: 2021-04-24 | Discharge: 2021-04-24 | Disposition: A | Discharge status: Home / Self Care | Payer: 59 | Attending: Emergency Medicine | Admitting: Emergency Medicine

## 2021-04-24 ENCOUNTER — Encounter (HOSPITAL_COMMUNITY): Payer: Self-pay | Admitting: *Deleted

## 2021-04-24 ENCOUNTER — Emergency Department (HOSPITAL_COMMUNITY): Payer: 59

## 2021-04-24 DIAGNOSIS — F17210 Nicotine dependence, cigarettes, uncomplicated: Secondary | ICD-10-CM | POA: Insufficient documentation

## 2021-04-24 DIAGNOSIS — I10 Essential (primary) hypertension: Secondary | ICD-10-CM | POA: Insufficient documentation

## 2021-04-24 DIAGNOSIS — W010XXA Fall on same level from slipping, tripping and stumbling without subsequent striking against object, initial encounter: Secondary | ICD-10-CM | POA: Diagnosis not present

## 2021-04-24 DIAGNOSIS — Y92002 Bathroom of unspecified non-institutional (private) residence single-family (private) house as the place of occurrence of the external cause: Secondary | ICD-10-CM | POA: Diagnosis not present

## 2021-04-24 DIAGNOSIS — M25561 Pain in right knee: Secondary | ICD-10-CM | POA: Insufficient documentation

## 2021-04-24 DIAGNOSIS — M25569 Pain in unspecified knee: Secondary | ICD-10-CM

## 2021-04-24 DIAGNOSIS — Z79899 Other long term (current) drug therapy: Secondary | ICD-10-CM | POA: Insufficient documentation

## 2021-04-24 DIAGNOSIS — Z7982 Long term (current) use of aspirin: Secondary | ICD-10-CM | POA: Diagnosis not present

## 2021-04-24 MED ORDER — NAPROXEN 375 MG PO TABS
375.0000 mg | ORAL_TABLET | Freq: Two times a day (BID) | ORAL | 0 refills | Status: AC
  Filled 2021-04-24: qty 20, 10d supply, fill #0

## 2021-04-24 NOTE — ED Provider Notes (Signed)
Presents the [facility] Provider Note   CSN: [id number] Arrival date & time: 04/24/21  1017     History Chief Complaint  Patient presents with   Fall   Leg Pain    Kimberly Pierce is a 65 y.o. female.  HPI  65 year old female urgency department for right knee pain.  Patient states last night she was in the bathroom, slipped on water and fell down onto the right knee.  Did not hit her head, no loss of consciousness, no syncope, no dizziness, no chest pain.  She is now complaining of right knee pain, pain with weightbearing.  Denies any numbness or tingling, no discoloration of the leg.  No neck/back pain.  Had a previous DVT that she states was provoked, not on anticoagulation.  No recent fever or illness.  Past Medical History:  Diagnosis Date   Adenomatous duodenal polyp    Autoimmune hepatitis ([place])    DVT (deep venous thrombosis) ([facility])    Elevated LFTs    Gallstones    H/O seasonal allergies    Hiatal hernia    Hypertension    Non-alcoholic cirrhosis ([place])    Pelvic fracture [facility])     Patient Active Problem List   Diagnosis Date Noted   Pelvic fracture ([place]) 03/03/2020   Liver cirrhosis secondary to NASH ([place]) 04/06/2019   Cervical cancer screening 05/04/2017   Elevated ferritin 05/08/2016   Routine general medical examination at a health care facility 05/07/2016   Cervical radiculitis 10/04/2014   Degeneration of meniscus of right knee 51/07/5850   Systolic murmur 77/82/4235   Primary osteoarthritis of both knees 09/11/2014   Vitamin D deficiency 03/24/2013   Hyperlipidemia with target LDL less than 130 03/23/2013   Hyperglycemia 03/23/2013   Fracture of lumbar vertebra, compression ([place]) 05/05/2011   Osteoporosis 05/05/2011   TOBACCO USE 07/31/2010   Essential hypertension 07/31/2010    Past Surgical History:  Procedure Laterality Date   ABDOMINAL HYSTERECTOMY     BIOPSY  09/03/2020   Procedure: BIOPSY;  Surgeon:  Irving Copas., MD;  Location: [facility];  Service: Gastroenterology;;   ENDOSCOPIC MUCOSAL RESECTION N/A 01/16/2020   Procedure: ENDOSCOPIC MUCOSAL RESECTION;  Surgeon: Irving Copas., MD;  Location: [facility];  Service: Gastroenterology;  Laterality: N/A;   ESOPHAGOGASTRODUODENOSCOPY (EGD) WITH PROPOFOL N/A 01/16/2020   Procedure: ESOPHAGOGASTRODUODENOSCOPY (EGD) WITH PROPOFOL;  Surgeon: Rush Landmark Telford Nab., MD;  Location: [place];  Service: Gastroenterology;  Laterality: N/A;   ESOPHAGOGASTRODUODENOSCOPY (EGD) WITH PROPOFOL N/A 09/03/2020   Procedure: ESOPHAGOGASTRODUODENOSCOPY (EGD) WITH PROPOFOL;  Surgeon: Rush Landmark Telford Nab., MD;  Location: [facility];  Service: Gastroenterology;  Laterality: N/A;   HEMOSTASIS CLIP PLACEMENT  01/16/2020   Procedure: HEMOSTASIS CLIP PLACEMENT;  Surgeon: Irving Copas., MD;  Location: [facility];  Service: Gastroenterology;;   POLYPECTOMY  01/16/2020   Procedure: POLYPECTOMY;  Surgeon: Irving Copas., MD;  Location: [place];  Service: Gastroenterology;;   SUBMUCOSAL LIFTING INJECTION  01/16/2020   Procedure: SUBMUCOSAL LIFTING INJECTION;  Surgeon: Irving Copas., MD;  Location: [facility];  Service: Gastroenterology;;   SUBMUCOSAL TATTOO INJECTION  01/16/2020   Procedure: SUBMUCOSAL TATTOO INJECTION;  Surgeon: Irving Copas., MD;  [facility];  Service: Gastroenterology;;     OB History   No obstetric history on file.     Family History  Problem Relation Age of Onset   Heart attack Father    Heart attack Mother    Alcohol abuse Other  Drug abuse Other    Hypertension Other    Thyroid disease Other    Colon cancer Neg Hx     Social History   Tobacco Use   Smoking status: Some Days    Types: Cigarettes   Smokeless tobacco: Never  Vaping Use   Vaping Use: Never used  Substance Use Topics   Alcohol use: Yes    Comment: occaisional   Drug use: No    Home  Medications Prior to Admission medications   Medication Sig Start Date End Date Taking? Authorizing Provider  naproxen (NAPROSYN) 375 MG tablet Take 1 tablet (375 mg total) by mouth 2 (two) times daily. 04/24/21  Yes Pattricia Weiher, Alvin Critchley, DO  acetaminophen (TYLENOL) 500 MG tablet Take 1,000 mg by mouth every 6 (six) hours as needed for moderate pain or mild pain.    [provider]  aspirin EC 81 MG tablet Take 81 mg by mouth daily.    [provider]  budesonide (ENTOCORT EC) 3 MG 24 hr capsule Take 2 capsules (6 mg total) by mouth daily. 04/19/21   Pyrtle, Lajuan Lines, MD  chlorthalidone (HYGROTON) 25 MG tablet Take 1 tablet (25 mg total) by mouth daily. 02/16/19   Janith Lima, MD  Cholecalciferol 25 MCG (1000 UT) tablet Take 1,000 Units by mouth daily.    [provider]  denosumab (PROLIA) 60 MG/ML SOSY injection INJECT 60 MG INTO THE SKIN EVERY 6 (SIX) MONTHS. 10/18/20 10/18/21  Tresa Garter, MD  gabapentin (NEURONTIN) 100 MG capsule Take 1-3 capsules (100-300 mg total) by mouth 3 (three) times daily as needed (nerve pain). Patient taking differently: Take 100 mg by mouth daily as needed (nerve pain). 02/29/20   Gregor Hams, MD  loratadine (CLARITIN) 10 MG tablet Take 10 mg by mouth daily as needed for allergies.     [provider]  nebivolol (BYSTOLIC) 5 MG tablet TAKE 1 TABLET BY MOUTH DAILY 03/06/20 03/06/21  Rai, Vernelle Emerald, MD  omeprazole (PRILOSEC) 40 MG capsule Take 1 capsule (40 mg total) by mouth daily. 10/02/20   Pyrtle, Lajuan Lines, MD  ondansetron (ZOFRAN-ODT) 4 MG disintegrating tablet TAKE 1 TABLET BY MOUTH EVERY 6 HOURS AS NEEDED FOR NAUSEA OR VOMITING. 08/28/20 08/28/21  Pyrtle, Lajuan Lines, MD  polyethylene glycol (MIRALAX / GLYCOLAX) 17 g packet Take 17 g by mouth daily as needed for mild constipation or moderate constipation (also available OTC). 03/06/20   Rai, Ripudeep Raliegh Ip, MD  tetrahydrozoline 0.05 % ophthalmic solution Place 1 drop into both eyes 4 (four)  times daily as needed (dry eyes, itchy eyes).     [provider]  traMADol (ULTRAM) 50 MG tablet Take 1 tablet (50 mg total) by mouth every 6 (six) hours as needed for severe pain. 03/06/20   Rai, Ripudeep Raliegh Ip, MD  Turmeric 500 MG TABS Take 500 mg by mouth daily.    [provider]    Allergies    Imuran [azathioprine] and Percocet [oxycodone-acetaminophen]  Review of Systems   Review of Systems  Constitutional:  Negative for fever.  HENT:  Negative for congestion.   Respiratory:  Negative for shortness of breath.   Cardiovascular:  Negative for chest pain.  Gastrointestinal:  Negative for abdominal pain.  Genitourinary:  Negative for dysuria.  Musculoskeletal:  Negative for back pain and neck pain.       + right knee pain  Skin:  Negative for color change.  Neurological:  Negative for dizziness, syncope, light-headedness  and headaches.   Physical Exam Updated Vital Signs BP (!) 192/102 (BP Location: Right Arm)   Pulse 84   Temp 98.5 F (36.9 C) (Oral)   Resp 17   SpO2 100%   Physical Exam Vitals and nursing note reviewed.  Constitutional:      General: She is not in acute distress.    Appearance: Normal appearance.  HENT:     Head: Normocephalic.     Mouth/Throat:     Mouth: Mucous membranes are moist.  Cardiovascular:     Rate and Rhythm: Normal rate.  Pulmonary:     Effort: Pulmonary effort is normal. No respiratory distress.  Musculoskeletal:     Comments: Mild tenderness to palpation of the right knee, no significant swelling/bruising, calf is soft and nonswollen, leg is neurovascularly intact  Skin:    General: Skin is warm.  Neurological:     Mental Status: She is alert and oriented to person, place, and time. Mental status is at baseline.  Psychiatric:        Mood and Affect: Mood normal.    ED Results / Procedures / Treatments   Labs (all labs ordered are listed, but only abnormal results are displayed) Labs Reviewed - No data to  display  EKG None  Radiology DG Knee Complete 4 Views Right  Result Date: 04/24/2021 CLINICAL DATA:  Fall with knee pain EXAM: RIGHT KNEE - COMPLETE 4+ VIEW COMPARISON:  None. FINDINGS: No evidence of fracture, dislocation, joint effusion or degenerative change. Some arterial calcification is present in the popliteal fossa. IMPRESSION: Negative. Electronically Signed   By: Nelson Chimes M.D.   On: 04/24/2021 09:03    Procedures Procedures   Medications Ordered in ED Medications - No data to display  ED Course  I have reviewed the triage vital signs and the nursing notes.  Pertinent labs & imaging results that were available during my care of the patient were reviewed by me and considered in my medical decision making (see chart for details).    MDM Rules/Calculators/A&P                           43-year-old female presents with a mechanical fall, right knee pain.  There is mild tenderness to palpation but otherwise no concerning findings on physical exam.  No syncope, chest pain or other concerning aspects of the fall.  X-ray of the knee shows no acute fracture.  Patient is able to weight-bear.  Plan for Ace wrap and pain control and outpatient follow-up.  Leg is otherwise neurovascularly intact.  Patient at this time appears safe and stable for discharge and will be treated as an outpatient.  Discharge plan and strict return to ED precautions discussed, patient verbalizes understanding and agreement.  Final Clinical Impression(s) / ED Diagnoses Final diagnoses:  None    Rx / DC Orders ED Discharge Orders          Ordered    naproxen (NAPROSYN) 375 MG tablet  2 times daily        04/24/21 1032             Annell Canty, Alvin Critchley, DO 04/24/21 1134

## 2021-04-24 NOTE — Discharge Instructions (Addendum)
You have been seen and discharged from the emergency department.  The x-ray of your knee was negative.  Use Ace wrap.  Rest, ice and elevate the knee.  Take pain medicine as needed.  Follow-up with your primary provider for reevaluation and further care. Take home medications as prescribed. If you have any worsening symptoms or further concerns for your health please return to an emergency department for further evaluation.

## 2021-04-24 NOTE — ED Triage Notes (Signed)
Pt reports leg giving out on her causing her to fall last night. Having right knee and lower leg pain, difficulty bearing weight.

## 2021-04-25 ENCOUNTER — Telehealth: Payer: Self-pay

## 2021-04-25 NOTE — Telephone Encounter (Signed)
Left detailed message for pt to come to the lab to have blood drawn. Pt knows she does not need an appt.

## 2021-04-25 NOTE — Telephone Encounter (Signed)
-----   Message from Algernon Huxley, RN sent at 01/23/2021 10:41 AM EDT ----- Regarding: labs Labs in three months

## 2021-04-29 ENCOUNTER — Other Ambulatory Visit (INDEPENDENT_AMBULATORY_CARE_PROVIDER_SITE_OTHER): Payer: 59

## 2021-04-29 DIAGNOSIS — K754 Autoimmune hepatitis: Secondary | ICD-10-CM | POA: Diagnosis not present

## 2021-04-29 LAB — HEPATIC FUNCTION PANEL
ALT: 20 U/L (ref 0–35)
AST: 21 U/L (ref 0–37)
Albumin: 4.5 g/dL (ref 3.5–5.2)
Alkaline Phosphatase: 80 U/L (ref 39–117)
Bilirubin, Direct: 0.1 mg/dL (ref 0.0–0.3)
Total Bilirubin: 0.6 mg/dL (ref 0.2–1.2)
Total Protein: 7.9 g/dL (ref 6.0–8.3)

## 2021-04-30 ENCOUNTER — Other Ambulatory Visit: Payer: Self-pay

## 2021-04-30 DIAGNOSIS — K754 Autoimmune hepatitis: Secondary | ICD-10-CM

## 2021-04-30 LAB — IGG: IgG (Immunoglobin G), Serum: 1479 mg/dL (ref 600–1540)

## 2021-05-11 ENCOUNTER — Telehealth: Payer: Self-pay

## 2021-05-11 NOTE — Telephone Encounter (Signed)
Prolia VOB initiated via [email or web address]  Last OV:  Next OV:  Last Prolia inj: 12/05/20 Next Prolia inj DUE: 06/07/21

## 2021-05-14 ENCOUNTER — Other Ambulatory Visit (HOSPITAL_COMMUNITY): Payer: Self-pay

## 2021-05-14 ENCOUNTER — Other Ambulatory Visit: Payer: Self-pay

## 2021-05-15 ENCOUNTER — Other Ambulatory Visit (HOSPITAL_COMMUNITY): Payer: Self-pay

## 2021-05-16 ENCOUNTER — Other Ambulatory Visit (HOSPITAL_COMMUNITY): Payer: Self-pay

## 2021-05-16 ENCOUNTER — Other Ambulatory Visit: Payer: Self-pay | Admitting: Internal Medicine

## 2021-05-23 ENCOUNTER — Other Ambulatory Visit (HOSPITAL_COMMUNITY): Payer: Self-pay

## 2021-06-04 NOTE — Telephone Encounter (Signed)
Pt ready for scheduling on or after 06/07/21  Out-of-pocket cost due at time of visit: $320  Primary: [employer] Prolia co-insurance: 20% (approximately $270) Admin fee co-insurance: $50  Secondary: n/a Prolia co-insurance:  Admin fee co-insurance:   Deductible: $300 of $300 met  Prior Auth: APPROVED PA# [id number] Valid: 10/12/20-10/11/21  ** This summary of benefits is an estimation of the patient's out-of-pocket cost. Exact cost may vary based on individual plan coverage.

## 2021-06-06 ENCOUNTER — Ambulatory Visit: Payer: 59

## 2021-06-17 NOTE — Telephone Encounter (Signed)
Prolia VOB initiated for 2023

## 2021-06-26 ENCOUNTER — Ambulatory Visit (INDEPENDENT_AMBULATORY_CARE_PROVIDER_SITE_OTHER): Payer: 59 | Admitting: Internal Medicine

## 2021-06-26 ENCOUNTER — Encounter: Payer: Self-pay | Admitting: Internal Medicine

## 2021-06-26 ENCOUNTER — Other Ambulatory Visit (INDEPENDENT_AMBULATORY_CARE_PROVIDER_SITE_OTHER): Payer: 59

## 2021-06-26 ENCOUNTER — Other Ambulatory Visit (HOSPITAL_COMMUNITY): Payer: Self-pay

## 2021-06-26 VITALS — BP 152/82 | HR 76 | Ht 66.0 in | Wt 202.6 lb

## 2021-06-26 DIAGNOSIS — K754 Autoimmune hepatitis: Secondary | ICD-10-CM

## 2021-06-26 DIAGNOSIS — K5900 Constipation, unspecified: Secondary | ICD-10-CM

## 2021-06-26 DIAGNOSIS — I10 Essential (primary) hypertension: Secondary | ICD-10-CM | POA: Diagnosis not present

## 2021-06-26 DIAGNOSIS — D132 Benign neoplasm of duodenum: Secondary | ICD-10-CM

## 2021-06-26 DIAGNOSIS — K2100 Gastro-esophageal reflux disease with esophagitis, without bleeding: Secondary | ICD-10-CM

## 2021-06-26 LAB — CBC WITH DIFFERENTIAL/PLATELET
Basophils Absolute: 0 10*3/uL (ref 0.0–0.1)
Basophils Relative: 0.7 % (ref 0.0–3.0)
Eosinophils Absolute: 0.2 10*3/uL (ref 0.0–0.7)
Eosinophils Relative: 4.1 % (ref 0.0–5.0)
HCT: 40.3 % (ref 36.0–46.0)
Hemoglobin: 13.1 g/dL (ref 12.0–15.0)
Lymphocytes Relative: 35.8 % (ref 12.0–46.0)
Lymphs Abs: 2.1 10*3/uL (ref 0.7–4.0)
MCHC: 32.6 g/dL (ref 30.0–36.0)
MCV: 98.7 fl (ref 78.0–100.0)
Monocytes Absolute: 0.6 10*3/uL (ref 0.1–1.0)
Monocytes Relative: 9.2 % (ref 3.0–12.0)
Neutro Abs: 3 10*3/uL (ref 1.4–7.7)
Neutrophils Relative %: 50.2 % (ref 43.0–77.0)
Platelets: 200 10*3/uL (ref 150.0–400.0)
RBC: 4.08 Mil/uL (ref 3.87–5.11)
RDW: 12.6 % (ref 11.5–15.5)
WBC: 6 10*3/uL (ref 4.0–10.5)

## 2021-06-26 LAB — COMPREHENSIVE METABOLIC PANEL
ALT: 22 U/L (ref 0–35)
AST: 27 U/L (ref 0–37)
Albumin: 4.5 g/dL (ref 3.5–5.2)
Alkaline Phosphatase: 80 U/L (ref 39–117)
BUN: 16 mg/dL (ref 6–23)
CO2: 25 mEq/L (ref 19–32)
Calcium: 10.1 mg/dL (ref 8.4–10.5)
Chloride: 103 mEq/L (ref 96–112)
Creatinine, Ser: 0.49 mg/dL (ref 0.40–1.20)
GFR: 98.19 mL/min (ref >60.00)
Glucose, Bld: 99 mg/dL (ref 70–99)
Potassium: 4 mEq/L (ref 3.5–5.1)
Sodium: 138 mEq/L (ref 135–145)
Total Bilirubin: 1.2 mg/dL (ref 0.2–1.2)
Total Protein: 8.1 g/dL (ref 6.0–8.3)

## 2021-06-26 LAB — HEPATIC FUNCTION PANEL
ALT: 22 U/L (ref 0–35)
AST: 27 U/L (ref 0–37)
Albumin: 4.5 g/dL (ref 3.5–5.2)
Alkaline Phosphatase: 80 U/L (ref 39–117)
Bilirubin, Direct: 0.2 mg/dL (ref 0.0–0.3)
Total Bilirubin: 1.2 mg/dL (ref 0.2–1.2)
Total Protein: 8.1 g/dL (ref 6.0–8.3)

## 2021-06-26 LAB — PROTIME-INR
INR: 1.1 ratio — ABNORMAL HIGH (ref 0.8–1.0)
Prothrombin Time: 12.1 s (ref 9.6–13.1)

## 2021-06-26 MED ORDER — POLYETHYLENE GLYCOL 3350 17 G PO PACK
17.0000 g | PACK | Freq: Every day | ORAL | 11 refills | Status: AC | PRN
  Filled 2021-06-26: qty 30, 30d supply, fill #0

## 2021-06-26 MED ORDER — BUDESONIDE 3 MG PO CPEP
3.0000 mg | ORAL_CAPSULE | Freq: Every day | ORAL | 3 refills | Status: AC
  Filled 2021-06-26 – 2021-10-08 (×2): qty 90, 90d supply, fill #0
  Filled 2022-01-17: qty 90, 90d supply, fill #1
  Filled 2022-03-31: qty 90, 90d supply, fill #2

## 2021-06-26 MED ORDER — OMEPRAZOLE 40 MG PO CPDR
40.0000 mg | DELAYED_RELEASE_CAPSULE | Freq: Every day | ORAL | 3 refills | Status: DC
  Filled 2021-06-26: qty 90, 90d supply, fill #0

## 2021-06-26 MED ORDER — CHLORTHALIDONE 25 MG PO TABS
25.0000 mg | ORAL_TABLET | Freq: Every day | ORAL | 0 refills | Status: AC
Start: 2021-06-26 — End: ?
  Filled 2021-06-26: qty 90, 90d supply, fill #0

## 2021-06-26 NOTE — Progress Notes (Signed)
Subjective:    Patient ID: Kimberly Pierce, female    DOB: 09-Jan-1956, 66 y.o.   MRN: [id number]  HPI Kalany Diekmann is a 66 year old female with autoimmune hepatitis with cirrhosis, history of duodenal adenoma, GERD with esophagitis, H. pylori negative gastritis, hypertension, hyperlipidemia and remote DVT who is here for follow-up.  She is here alone today and I last saw her in April.  She is doing very well.  She denies complaint today.  She reduce budesonide from 6 to 3 mg in November.  We tried azathioprine again but this was limited by severe nausea and upper abdominal pain.  No recent issues with poor appetite, heartburn, dysphagia, abdominal pain, jaundice, dark urine, diarrhea, constipation, blood in stool or melena.  She does use MiraLAX daily.  She is taking omeprazole 40 mg daily.  She asked for a refill of chlorthalidone   Review of Systems As per HPI, otherwise negative  Current Medications, Allergies, Past Medical History, Past Surgical History, Family History and Social History were reviewed in [employer] record.    Objective:   Physical Exam BP (!) 152/82    Pulse 76    Ht 5' 6"  (1.676 m)    Wt 202 lb 9.6 oz (91.9 kg)    BMI 32.70 kg/m  Gen: awake, alert, NAD HEENT: anicteric CV: RRR, no mrg Pulm: CTA b/l Abd: soft, NT/ND, +BS throughout Ext: no c/c/e Neuro: nonfocal, no asterixis  Lab Results  Component Value Date   INR 1.1 (H) 06/26/2021   INR 1.1 (H) 08/28/2020   INR 1.1 (H) 07/26/2020   CMP     Component Value Date/Time   NA 138 06/26/2021 1423   K 4.0 06/26/2021 1423   CL 103 06/26/2021 1423   CO2 25 06/26/2021 1423   GLUCOSE 99 06/26/2021 1423   BUN 16 06/26/2021 1423   CREATININE 0.49 06/26/2021 1423   CALCIUM 10.1 06/26/2021 1423   CALCIUM 10.2 05/05/2011 1417   PROT 8.1 06/26/2021 1423   PROT 8.1 06/26/2021 1423   ALBUMIN 4.5 06/26/2021 1423   ALBUMIN 4.5 06/26/2021 1423   AST 27 06/26/2021 1423   AST 27 06/26/2021  1423   ALT 22 06/26/2021 1423   ALT 22 06/26/2021 1423   ALKPHOS 80 06/26/2021 1423   ALKPHOS 80 06/26/2021 1423   BILITOT 1.2 06/26/2021 1423   BILITOT 1.2 06/26/2021 1423   GFRNONAA >60 03/06/2020 0207   GFRAA >60 03/06/2020 0207   CBC Latest Ref Rng & Units 06/26/2021 01/21/2021 10/26/2020  WBC 4.0 - 10.5 K/uL 6.0 7.9 6.3  Hemoglobin 12.0 - 15.0 g/dL 13.1 14.0 13.8  Hematocrit 36.0 - 46.0 % 40.3 42.0 40.9  Platelets 150.0 - 400.0 K/uL 200.0 180.0 179.0        Assessment & Plan:  66 year old female with autoimmune hepatitis with cirrhosis, history of duodenal adenoma, GERD with esophagitis, H. pylori negative gastritis, hypertension, hyperlipidemia and remote DVT who is here for follow-up.   AIH with cirrhosis --liver enzymes have remained normal and we have been able to work down budesonide to 3 mg daily.  She was intolerant of azathioprine due to severe nausea and upper abdominal pain.  No evidence of decompensated liver disease, her cirrhosis is remained quite stable. --Continue budesonide 3 mg daily --CBC, CMP and INR check today as above --Check AFP --HCC screening ultrasound scheduled --See me in 6 to 12 months  2.  History of duodenal adenoma --surveillance plan with Dr. Rush Landmark in 2023; I  will send him a note as this will need to be scheduled in the coming months  3.  GERD with history of mild esophagitis and gastritis --continue omeprazole 40 mg daily  4.  CRC screening --negative Cologuard February 2022, repeat recommended February 2025  5.  Hypertension --she has chlorthalidone 25 mg and nebivolol 5 mg daily on her med list.  She is asking for refill of chlorthalidone which I will do x1 month.  I recommended she follow-up with her primary care provider Dr. Ronnald Ramp for ongoing management of her hypertension and for further refills  30 minutes total spent today including patient facing time, coordination of care, reviewing medical history/procedures/pertinent radiology  studies, and documentation of the encounter.

## 2021-06-26 NOTE — Patient Instructions (Addendum)
If you are age 67 or older, your body mass index should be between 23-30. Your There is no height or weight on file to calculate BMI. If this is out of the aforementioned range listed, please consider follow up with your Primary Care Provider.  If you are age 34 or younger, your body mass index should be between 19-25. Your There is no height or weight on file to calculate BMI. If this is out of the aformentioned range listed, please consider follow up with your Primary Care Provider.   __________________________________________________________  The [facility] providers would like to encourage you to use [facility] to communicate with providers for non-urgent requests or questions.  Due to long hold times on the telephone, sending your provider a message by [facility] may be a faster and more efficient way to get a response.  Please allow 48 business hours for a response.  Please remember that this is for non-urgent requests.    Due to recent changes in healthcare laws, you may see the results of your imaging and laboratory studies on MyChart before your provider has had a chance to review them.  We understand that in some cases there may be results that are confusing or concerning to you. Not all laboratory results come back in the same time frame and the provider may be waiting for multiple results in order to interpret others.  Please give [place] 48 hours in order for your provider to thoroughly review all the results before contacting the office for clarification of your results.    Your provider has requested that you go to the basement level for lab work before leaving today. Press "B" on the elevator. The lab is located at the first door on the left as you exit the elevator.  Someone will contact you for Endoscopy with Dr. Rush Landmark.   You have been scheduled for an abdominal ultrasound at [facility] (1st floor of hospital) on 07/03/21 at 9:30. Please arrive 15 minutes prior to your  appointment for registration. Make certain not to have anything to eat or drink 6 hours prior to your appointment. Should you need to reschedule your appointment, please contact radiology at [phone]. This test typically takes about 30 minutes to perform.   We have sent the following medications to your pharmacy for you to pick up at your convenience: Budesonide 3MG, Omeprazole 40MG, Miralax 17g   Thank you for choosing me and [facility].  Dr. Hilarie Fredrickson

## 2021-06-27 LAB — AFP TUMOR MARKER: AFP-Tumor Marker: 3.2 ng/mL

## 2021-06-28 ENCOUNTER — Telehealth: Payer: Self-pay

## 2021-06-28 NOTE — Telephone Encounter (Signed)
-----   Message from Irving Copas., MD sent at 06/28/2021  5:54 AM EST ----- JMP, Sounds like a plan.  I have not seen her recall come through yet but I will make sure we try and get her in March/April.  Kimberly Pierce, This patient needs EGD 1 hour slot with me in the hospital for follow-up of duodenal adenoma.  Please schedule in March/April. Thanks. GM ----- Message ----- From: Jerene Bears, MD Sent: 06/26/2021   4:37 PM EST To: Irving Copas., MD  Kimberly Deutscher [name] today.  She is going well.  You had recommend EGD with her in March, so I am just putting this on your radar.  She is agreeable. Thanks for your help. Ulice Dash

## 2021-07-01 ENCOUNTER — Other Ambulatory Visit: Payer: Self-pay

## 2021-07-01 DIAGNOSIS — D132 Benign neoplasm of duodenum: Secondary | ICD-10-CM

## 2021-07-01 NOTE — Telephone Encounter (Signed)
The pt has been scheduled for 08/15/21 at 730 am at [facility] with GM.  Left message on machine to call back

## 2021-07-01 NOTE — Telephone Encounter (Signed)
EGD scheduled, pt instructed and medications reviewed.  Patient instructions mailed to home.  Patient to call with any questions or concerns.  

## 2021-07-02 NOTE — Telephone Encounter (Signed)
Prior auth required for PROLIA  PA PROCESS DETAILS: PA is required and is currently not on file. Please call Medical Review at [phone] to initiate PA

## 2021-07-03 ENCOUNTER — Other Ambulatory Visit: Payer: Self-pay

## 2021-07-03 ENCOUNTER — Ambulatory Visit (HOSPITAL_COMMUNITY)
Admission: RE | Admit: 2021-07-03 | Discharge: 2021-07-03 | Disposition: A | Discharge status: Home / Self Care | Payer: 59 | Source: Ambulatory Visit | Attending: Internal Medicine | Admitting: Internal Medicine

## 2021-07-03 DIAGNOSIS — K2100 Gastro-esophageal reflux disease with esophagitis, without bleeding: Secondary | ICD-10-CM | POA: Diagnosis present

## 2021-07-03 DIAGNOSIS — K754 Autoimmune hepatitis: Secondary | ICD-10-CM | POA: Insufficient documentation

## 2021-07-03 DIAGNOSIS — K8020 Calculus of gallbladder without cholecystitis without obstruction: Secondary | ICD-10-CM | POA: Diagnosis not present

## 2021-07-03 DIAGNOSIS — K5900 Constipation, unspecified: Secondary | ICD-10-CM | POA: Insufficient documentation

## 2021-07-03 DIAGNOSIS — K7460 Unspecified cirrhosis of liver: Secondary | ICD-10-CM | POA: Diagnosis not present

## 2021-07-10 ENCOUNTER — Other Ambulatory Visit: Payer: Self-pay

## 2021-07-10 ENCOUNTER — Ambulatory Visit (INDEPENDENT_AMBULATORY_CARE_PROVIDER_SITE_OTHER): Payer: 59

## 2021-07-10 DIAGNOSIS — M8080XA Other osteoporosis with current pathological fracture, unspecified site, initial encounter for fracture: Secondary | ICD-10-CM | POA: Diagnosis not present

## 2021-07-10 MED ORDER — DENOSUMAB 60 MG/ML ~~LOC~~ SOSY
60.0000 mg | PREFILLED_SYRINGE | Freq: Once | SUBCUTANEOUS | Status: AC
  Administered 2021-07-10: 60 mg via SUBCUTANEOUS

## 2021-07-10 NOTE — Progress Notes (Signed)
Pt here for Prolia injection  Prolia given Sub Q and pt tolerated injection well.

## 2021-07-17 NOTE — Telephone Encounter (Signed)
Prior auth initiated via [facility] portal PA# [id number]

## 2021-07-25 NOTE — Telephone Encounter (Signed)
Last Prolia inj 07/10/21 Next Prolia inj due 01/08/22

## 2021-08-02 NOTE — Telephone Encounter (Signed)
[name] from [facility] calling in  [place] they need clinical notes.. hmp progress notes.Marland Kitchen also including medication dose & frequency for the denosumab (PROLIA) 60 MG/ML SOSY injection  Says she needs to have it faxed back by 3pm today 02.24.23   Fax # [phone] Case # [id number]

## 2021-08-07 ENCOUNTER — Encounter (HOSPITAL_COMMUNITY): Payer: Self-pay | Admitting: Gastroenterology

## 2021-08-14 NOTE — Anesthesia Preprocedure Evaluation (Addendum)
Anesthesia Evaluation  ?Patient identified by MRN, date of birth, ID band ?Patient awake ? ? ? ?Reviewed: ?Allergy & Precautions, NPO status , Patient's Chart, lab work & pertinent test results ? ?Airway ?Mallampati: III ? ?TM Distance: >3 FB ?Neck ROM: Full ? ? ? Dental ? ?(+) Poor Dentition, Dental Advisory Given, Loose, Missing,  ?  ?Pulmonary ?neg pulmonary ROS, Current Smoker and Patient abstained from smoking.,  ?  ?Pulmonary exam normal ?breath sounds clear to auscultation ? ? ? ? ? ? Cardiovascular ?hypertension, + DVT  ?Normal cardiovascular exam ?Rhythm:Regular Rate:Normal ? ?TTE 2016 normal EF, valves ok ?  ?Neuro/Psych ?negative neurological ROS ? negative psych ROS  ? GI/Hepatic ?hiatal hernia, (+) Cirrhosis  ?  ?  ? , Hepatitis -, Autoimmune  ?Endo/Other  ?negative endocrine ROS ? Renal/GU ?negative Renal ROS  ?negative genitourinary ?  ?Musculoskeletal ?negative musculoskeletal ROS ?(+)  ? Abdominal ?  ?Peds ? Hematology ?negative hematology ROS ?(+)   ?Anesthesia Other Findings ? ? Reproductive/Obstetrics ? ?  ? ? ? ? ? ? ? ? ? ? ? ? ? ?  ?  ? ? ? ? ? ? ? ?Anesthesia Physical ?Anesthesia Plan ? ?ASA: 3 ? ?Anesthesia Plan: MAC  ? ?Post-op Pain Management:   ? ?Induction: Intravenous ? ?PONV Risk Score and Plan: Propofol infusion and Treatment may vary due to age or medical condition ? ?Airway Management Planned: Natural Airway ? ?Additional Equipment:  ? ?Intra-op Plan:  ? ?Post-operative Plan:  ? ?Informed Consent: I have reviewed the patients History and Physical, chart, labs and discussed the procedure including the risks, benefits and alternatives for the proposed anesthesia with the patient or authorized representative who has indicated his/her understanding and acceptance.  ? ? ? ?Dental advisory given ? ?Plan Discussed with: CRNA ? ?Anesthesia Plan Comments:   ? ? ? ? ? ? ?Anesthesia Quick Evaluation ? ?

## 2021-08-15 ENCOUNTER — Ambulatory Visit (HOSPITAL_COMMUNITY): Payer: 59 | Admitting: Anesthesiology

## 2021-08-15 ENCOUNTER — Ambulatory Visit (HOSPITAL_BASED_OUTPATIENT_CLINIC_OR_DEPARTMENT_OTHER): Payer: 59 | Admitting: Anesthesiology

## 2021-08-15 ENCOUNTER — Ambulatory Visit (HOSPITAL_COMMUNITY)
Admission: RE | Admit: 2021-08-15 | Discharge: 2021-08-15 | Disposition: A | Discharge status: Home / Self Care | Payer: 59 | Attending: Gastroenterology | Admitting: Gastroenterology

## 2021-08-15 ENCOUNTER — Encounter (HOSPITAL_COMMUNITY)
Admission: RE | Disposition: A | Discharge status: Home / Self Care | Payer: Self-pay | Source: Home / Self Care | Attending: Gastroenterology

## 2021-08-15 ENCOUNTER — Encounter (HOSPITAL_COMMUNITY): Payer: Self-pay | Admitting: Gastroenterology

## 2021-08-15 ENCOUNTER — Other Ambulatory Visit: Payer: Self-pay

## 2021-08-15 ENCOUNTER — Other Ambulatory Visit (HOSPITAL_COMMUNITY): Payer: Self-pay

## 2021-08-15 DIAGNOSIS — K319 Disease of stomach and duodenum, unspecified: Secondary | ICD-10-CM | POA: Diagnosis not present

## 2021-08-15 DIAGNOSIS — I10 Essential (primary) hypertension: Secondary | ICD-10-CM | POA: Insufficient documentation

## 2021-08-15 DIAGNOSIS — F17210 Nicotine dependence, cigarettes, uncomplicated: Secondary | ICD-10-CM | POA: Diagnosis not present

## 2021-08-15 DIAGNOSIS — K449 Diaphragmatic hernia without obstruction or gangrene: Secondary | ICD-10-CM | POA: Diagnosis not present

## 2021-08-15 DIAGNOSIS — K7460 Unspecified cirrhosis of liver: Secondary | ICD-10-CM | POA: Diagnosis not present

## 2021-08-15 DIAGNOSIS — K766 Portal hypertension: Secondary | ICD-10-CM | POA: Insufficient documentation

## 2021-08-15 DIAGNOSIS — Z09 Encounter for follow-up examination after completed treatment for conditions other than malignant neoplasm: Secondary | ICD-10-CM | POA: Insufficient documentation

## 2021-08-15 DIAGNOSIS — I8510 Secondary esophageal varices without bleeding: Secondary | ICD-10-CM | POA: Insufficient documentation

## 2021-08-15 DIAGNOSIS — K3189 Other diseases of stomach and duodenum: Secondary | ICD-10-CM | POA: Diagnosis not present

## 2021-08-15 DIAGNOSIS — K2970 Gastritis, unspecified, without bleeding: Secondary | ICD-10-CM | POA: Diagnosis not present

## 2021-08-15 DIAGNOSIS — D132 Benign neoplasm of duodenum: Secondary | ICD-10-CM

## 2021-08-15 DIAGNOSIS — K317 Polyp of stomach and duodenum: Secondary | ICD-10-CM | POA: Diagnosis not present

## 2021-08-15 DIAGNOSIS — Z86718 Personal history of other venous thrombosis and embolism: Secondary | ICD-10-CM | POA: Diagnosis not present

## 2021-08-15 DIAGNOSIS — Z8719 Personal history of other diseases of the digestive system: Secondary | ICD-10-CM | POA: Diagnosis not present

## 2021-08-15 HISTORY — PX: BIOPSY: SHX5522

## 2021-08-15 HISTORY — PX: ESOPHAGOGASTRODUODENOSCOPY (EGD) WITH PROPOFOL: SHX5813

## 2021-08-15 SURGERY — ESOPHAGOGASTRODUODENOSCOPY (EGD) WITH PROPOFOL
Anesthesia: Monitor Anesthesia Care

## 2021-08-15 MED ORDER — GLYCOPYRROLATE PF 0.2 MG/ML IJ SOSY
PREFILLED_SYRINGE | INTRAMUSCULAR | Status: DC | PRN
Start: 2021-08-15 — End: 2021-08-15
  Administered 2021-08-15: .2 mg via INTRAVENOUS

## 2021-08-15 MED ORDER — OMEPRAZOLE 40 MG PO CPDR
40.0000 mg | DELAYED_RELEASE_CAPSULE | Freq: Every day | ORAL | 12 refills | Status: AC
  Filled 2021-08-15: qty 60, 60d supply, fill #0

## 2021-08-15 MED ORDER — PROPOFOL 10 MG/ML IV BOLUS
INTRAVENOUS | Status: DC | PRN
  Administered 2021-08-15: 100 mg via INTRAVENOUS
  Administered 2021-08-15: 25 mg via INTRAVENOUS

## 2021-08-15 MED ORDER — SODIUM CHLORIDE 0.9 % IV SOLN: INTRAVENOUS | Status: DC

## 2021-08-15 MED ORDER — PROPOFOL 500 MG/50ML IV EMUL
INTRAVENOUS | Status: DC | PRN
  Administered 2021-08-15: 200 ug/kg/min via INTRAVENOUS

## 2021-08-15 MED ORDER — LACTATED RINGERS IV SOLN: INTRAVENOUS | Status: DC | PRN

## 2021-08-15 MED ORDER — LIDOCAINE 2% (20 MG/ML) 5 ML SYRINGE
INTRAMUSCULAR | Status: DC | PRN
  Administered 2021-08-15: 100 mg via INTRAVENOUS

## 2021-08-15 SURGICAL SUPPLY — 15 items

## 2021-08-15 NOTE — H&P (Signed)
? ?GASTROENTEROLOGY PROCEDURE H&P NOTE  ? ?Primary Care Physician: ?Janith Lima, MD ? ?HPI: ?Kimberly Pierce is a 66 y.o. female who presents for EGD for follow up of Duodenal TA s/p piecemeal EMR and then recurrence in 2022. ? ?Past Medical History:  ?Diagnosis Date  ? Adenomatous duodenal polyp   ? Autoimmune hepatitis ([place])   ? DVT (deep venous thrombosis) ([place])   ? Elevated LFTs   ? Gallstones   ? H/O seasonal allergies   ? Hiatal hernia   ? Hypertension   ? Non-alcoholic cirrhosis ([place])   ? Pelvic fracture ([facility])   ? ?Past Surgical History:  ?Procedure Laterality Date  ? ABDOMINAL HYSTERECTOMY    ? BIOPSY  09/03/2020  ? Procedure: BIOPSY;  Surgeon: Irving Copas., MD;  Location: [facility];  Service: Gastroenterology;;  ? ENDOSCOPIC MUCOSAL RESECTION N/A 01/16/2020  ? Procedure: ENDOSCOPIC MUCOSAL RESECTION;  Surgeon: Rush Landmark Telford Nab., MD;  Location: [place];  Service: Gastroenterology;  Laterality: N/A;  ? ESOPHAGOGASTRODUODENOSCOPY (EGD) WITH PROPOFOL N/A 01/16/2020  ? Procedure: ESOPHAGOGASTRODUODENOSCOPY (EGD) WITH PROPOFOL;  Surgeon: Rush Landmark Telford Nab., MD;  Location: [place];  Service: Gastroenterology;  Laterality: N/A;  ? ESOPHAGOGASTRODUODENOSCOPY (EGD) WITH PROPOFOL N/A 09/03/2020  ? Procedure: ESOPHAGOGASTRODUODENOSCOPY (EGD) WITH PROPOFOL;  Surgeon: Rush Landmark Telford Nab., MD;  Location: [facility];  Service: Gastroenterology;  Laterality: N/A;  ? HEMOSTASIS CLIP PLACEMENT  01/16/2020  ? Procedure: HEMOSTASIS CLIP PLACEMENT;  Surgeon: Irving Copas., MD;  Location: [place];  Service: Gastroenterology;;  ? POLYPECTOMY  01/16/2020  ? Procedure: POLYPECTOMY;  Surgeon: Mansouraty, Telford Nab., MD;  Location: [place];  Service: Gastroenterology;;  ? [place] INJECTION  01/16/2020  ? Procedure: SUBMUCOSAL LIFTING INJECTION;  Surgeon: Irving Copas., MD;  Location: [place];  Service: Gastroenterology;;  ? SUBMUCOSAL TATTOO INJECTION   01/16/2020  ? Procedure: SUBMUCOSAL TATTOO INJECTION;  Surgeon: Irving Copas., MD;  Location: [place];  Service: Gastroenterology;;  ? ?Current Facility-Administered Medications  ?Medication Dose Route Frequency Provider Last Rate Last Admin  ? 0.9 %  sodium chloride infusion   Intravenous Continuous Mansouraty, Telford Nab., MD      ? ?Facility-Administered Medications Ordered in Other Encounters  ?Medication Dose Route Frequency Provider Last Rate Last Admin  ? glycopyrrolate (ROBINUL) injection   Intravenous Anesthesia Intra-op Re[place] Dam, CRNA   0.2 mg at 08/15/21 7322  ? ? ?Current Facility-Administered Medications:  ?  0.9 %  sodium chloride infusion, , Intravenous, Continuous, Mansouraty, Telford Nab., MD ? ?Facility-Administered Medications Ordered in Other Encounters:  ?  glycopyrrolate (ROBINUL) injection, , Intravenous, Anesthesia Intra-op, Re[place], CRNA, 0.2 mg at 08/15/21 0724 ?Allergies  ?Allergen Reactions  ? Imuran [Azathioprine] Nausea And Vomiting  ? Percocet [Oxycodone-Acetaminophen] Rash  ?  Rash with Percocet Rxed in ER 08/28/14   ? ?Family History  ?Problem Relation Age of Onset  ? Heart attack Father   ? Heart attack Mother   ? Alcohol abuse Other   ? Drug abuse Other   ? Hypertension Other   ? Thyroid disease Other   ? Colon cancer Neg Hx   ? ?Social History  ? ?Socioeconomic History  ? Marital status: Legally Separated  ?  Spouse name: Not on file  ? Number of children: 3  ? Years of education: Not on file  ? Highest education level: Not on file  ?Occupational History  ? Not on file  ?Tobacco Use  ? Smoking status: Some Days  ?  Types:  Cigarettes  ? Smokeless tobacco: Never  ?Vaping Use  ? Vaping Use: Never used  ?Substance and Sexual Activity  ? Alcohol use: Yes  ?  Comment: occaisional  ? Drug use: No  ? Sexual activity: Yes  ?  Birth control/protection: Surgical  ?Other Topics Concern  ? Not on file  ?Social History Narrative  ? Not on file  ? ?Social Determinants of  Health  ? ?Financial Resource Strain: Not on file  ?Food Insecurity: Not on file  ?Transportation Needs: Not on file  ?Physical Activity: Not on file  ?Stress: Not on file  ?Social Connections: Not on file  ?Intimate Partner Violence: Not on file  ? ? ?Physical Exam: ?Today's Vitals  ? 08/15/21 0713  ?BP: (!) 216/75  ?Pulse: 70  ?Resp: 14  ?Temp: (!) 97.5 ?F (36.4 ?C)  ?TempSrc: Temporal  ?SpO2: 98%  ?Weight: 93.9 kg  ?Height: 5' 6"  (1.676 m)  ?PainSc: 0-No pain  ? ?Body mass index is 33.41 kg/m?. ?GEN: NAD ?EYE: Sclerae anicteric ?ENT: MMM ?CV: Non-tachycardic ?GI: Soft, NT/ND ?NEURO:  Alert & Oriented x 3 ? ?Lab Results: ?No results for input(s): WBC, HGB, HCT, PLT in the last 72 hours. ?BMET ?No results for input(s): NA, K, CL, CO2, GLUCOSE, BUN, CREATININE, CALCIUM in the last 72 hours. ?LFT ?No results for input(s): PROT, ALBUMIN, AST, ALT, ALKPHOS, BILITOT, BILIDIR, IBILI in the last 72 hours. ?PT/INR ?No results for input(s): LABPROT, INR in the last 72 hours. ? ? ?Impression / Plan: ?This is a 66 y.o.female who presents for EGD for follow up of Duodenal TA s/p piecemeal EMR and then recurrence in 2022. ? ?The risks and benefits of endoscopic evaluation/treatment were discussed with the patient and/or family; these include but are not limited to the risk of perforation, infection, bleeding, missed lesions, lack of diagnosis, severe illness requiring hospitalization, as well as anesthesia and sedation related illnesses.  The patient's history has been reviewed, patient examined, no change in status, and deemed stable for procedure.  The patient and/or family is agreeable to proceed.  ? ? ?Justice Britain, MD ?[facility] ?Advanced Endoscopy ?Office # [phone] ? ?

## 2021-08-15 NOTE — Anesthesia Postprocedure Evaluation (Signed)
Anesthesia Post Note ? ?Patient: Kimberly Pierce ? ?Procedure(s) Performed: ESOPHAGOGASTRODUODENOSCOPY (EGD) WITH PROPOFOL ?BIOPSY ? ?  ? ?Patient location during evaluation: PACU ?Anesthesia Type: MAC ?Level of consciousness: awake and alert ?Pain management: pain level controlled ?Vital Signs Assessment: post-procedure vital signs reviewed and stable ?Respiratory status: spontaneous breathing, nonlabored ventilation, respiratory function stable and patient connected to nasal cannula oxygen ?Cardiovascular status: stable and blood pressure returned to baseline ?Postop Assessment: no apparent nausea or vomiting ?Anesthetic complications: no ? ? ?No notable events documented. ? ?Last Vitals:  ?Vitals:  ? 08/15/21 0824 08/15/21 0830  ?BP: (!) 181/79 (!) 180/82  ?Pulse: (!) 58 60  ?Resp: 13 13  ?Temp:  (!) 36.4 ?C  ?SpO2: 99% 99%  ?  ?Last Pain:  ?Vitals:  ? 08/15/21 0830  ?TempSrc:   ?PainSc: 0-No pain  ? ? ?  ?  ?  ?  ?  ?  ? ?Skye Plamondon L Giovani Neumeister ? ? ? ? ?

## 2021-08-15 NOTE — Op Note (Signed)
[facility] Patient Name: Kimberly Pierce Procedure Date : 08/15/2021 MRN: [id number] Attending MD: Justice Britain , MD Date of Birth: 17-Jul-1955 CSN: [id number] Age: 66 Admit Type: Outpatient Procedure:                Upper GI endoscopy Indications:              Cirrhosis rule out esophageal varices, Follow-up of                            polyps in the duodenum Providers:                Justice Britain, MD, Ervin Knack, RN, Tyna Jaksch Technician Referring MD:             Lajuan Lines. Hilarie Fredrickson, MD, Janith Lima, MD Medicines:                Monitored Anesthesia Care Complications:            No immediate complications. Estimated Blood Loss:     Estimated blood loss was minimal. Procedure:                Pre-Anesthesia Assessment:                           - Prior to the procedure, a History and Physical                            was performed, and patient medications and                            allergies were reviewed. The patient's tolerance of                            previous anesthesia was also reviewed. The risks                            and benefits of the procedure and the sedation                            options and risks were discussed with the patient.                            All questions were answered, and informed consent                            was obtained. Prior Anticoagulants: The patient has                            taken no previous anticoagulant or antiplatelet                            agents except for NSAID medication. ASA Grade  Assessment: III - A patient with severe systemic                            disease. After reviewing the risks and benefits,                            the patient was deemed in satisfactory condition to                            undergo the procedure.                           After obtaining informed consent, the endoscope was                             passed under direct vision. Throughout the                            procedure, the patient's blood pressure, pulse, and                            oxygen saturations were monitored continuously. The                            [id number]-H190 ([id number]) Olympus endoscope was introduced                            through the mouth, and advanced to the third part                            of duodenum. The upper GI endoscopy was                            accomplished without difficulty. The patient                            tolerated the procedure. Scope In: Scope Out: Findings:      No gross lesions were noted in the proximal esophagus and in the mid       esophagus.      Small grade I varices were found in the distal esophagus.      The Z-line was irregular and was found 40 cm from the incisors.      A 1 cm hiatal hernia was present.      There is no endoscopic evidence of gastric varices in the entire       examined stomach.      Mild portal hypertensive gastropathy was found in the cardia, in the       gastric fundus and in the gastric body.      Localized moderate inflammation characterized by erosions, erythema,       friability and shallow linear ulcerations were found in the gastric       antrum.      No other gross lesions were noted in the entire examined stomach.       Biopsies were taken with a cold forceps for histology and  Helicobacter       pylori testing.      A tattoo was seen in the second portion of the duodenum.      A medium post mucosectomy scar was found in the second portion of the       duodenum. There was no evidence of the previous polyp.      No other gross lesions were noted in the duodenal bulb, in the first       portion of the duodenum, in the second portion of the duodenum and in       the third portion of the duodenum. Impression:               - No gross lesions in esophagus proximally. Grade I                            esophageal varices  distally.                           - Z-line irregular, 40 cm from the incisors.                           - 1 cm hiatal hernia.                           - Portal hypertensive gastropathy in proximal                            stomach.                           - Gastritis and linear ulcerations in antrum                            distally.                           - Biopsied stomach for HP, though concern that                            NSAIDs may be reason for this.                           - A tattoo was seen in the duodenum contralateral                            to a duodenal scar from prior mucosectomy. No                            evidence of recurrence.                           - No other gross lesions in the duodenal bulb, in                            the first portion of the duodenum, in the second  portion of the duodenum and in the third portion of                            the duodenum. Recommendation:           - The patient will be observed post-procedure,                            until all discharge criteria are met.                           - Discharge patient to home.                           - Patient has a contact number available for                            emergencies. The signs and symptoms of potential                            delayed complications were discussed with the                            patient. Return to normal activities tomorrow.                            Written discharge instructions were provided to the                            patient.                           - Resume previous diet.                           - Increase PPI back to 40 mg twice daily and                            maintain.                           - Patient now has evidence of Portal hypertension                            (EVs and PHG). Nebivolol is a beta 1 selective                            medication that it may increase  portal hypertension                            and would not be used for primary prevention of                            esophageal variceal bleeding. Recommend primary GI  and PCP consider whether Carvedilol could be                            transitioned to or Nadolol/Propanolol.                           - Observe patient's clinical course.                           - Await pathology results.                           - From a duodenal adenoma surveillance perspective                            can push out to 2-years. However, with the gastric                            ulceration, will allow primary GI to consider                            whether an earlier follow up EGD may need to be                            considered to ensure healing if need be.                           - Transitioning away from NSAIDs if possible will                            be most ideal.                           - The findings and recommendations were discussed                            with the patient.                           - The findings and recommendations were discussed                            with the designated responsible adult. Procedure Code(s):        --- Professional ---                           [phone], Esophagogastroduodenoscopy, flexible,                            transoral; with biopsy, single or multiple Diagnosis Code(s):        --- Professional ---                           K74.60, Unspecified cirrhosis of liver  I85.10, Secondary esophageal varices without                            bleeding                           K22.8, Other specified diseases of esophagus                           K44.9, Diaphragmatic hernia without obstruction or                            gangrene                           K29.70, Gastritis, unspecified, without bleeding                           K76.6, Portal hypertension                            K31.89, Other diseases of stomach and duodenum                           K31.7, Polyp of stomach and duodenum CPT copyright 2019 American Medical Association. All rights reserved. The codes documented in this report are preliminary and upon coder review may  be revised to meet current compliance requirements. Justice Britain, MD 08/15/2021 8:08:11 AM Number of Addenda: 0

## 2021-08-15 NOTE — Transfer of Care (Signed)
Immediate Anesthesia Transfer of Care Note ? ?Patient: Kimberly Pierce ? ?Procedure(s) Performed: ESOPHAGOGASTRODUODENOSCOPY (EGD) WITH PROPOFOL ?BIOPSY ? ?Patient Location: PACU ? ?Anesthesia Type:MAC ? ?Level of Consciousness: awake, alert  and oriented ? ?Airway & Oxygen Therapy: Patient Spontanous Breathing ? ?Post-op Assessment: Report given to RN and Post -op Vital signs reviewed and stable ? ?Post vital signs: Reviewed and stable ? ?Last Vitals:  ?Vitals Value Taken Time  ?BP    ?Temp    ?Pulse    ?Resp    ?SpO2    ? ? ?Last Pain:  ?Vitals:  ? 08/15/21 0713  ?TempSrc: Temporal  ?PainSc: 0-No pain  ?   ? ?  ? ?Complications: No notable events documented. ?

## 2021-08-15 NOTE — Discharge Instructions (Signed)
Review discharge papers per Dr. Rush Landmark ?

## 2021-08-16 ENCOUNTER — Other Ambulatory Visit: Payer: Self-pay | Admitting: Internal Medicine

## 2021-08-16 ENCOUNTER — Encounter: Payer: Self-pay | Admitting: Gastroenterology

## 2021-08-16 DIAGNOSIS — I10 Essential (primary) hypertension: Secondary | ICD-10-CM

## 2021-08-16 LAB — SURGICAL PATHOLOGY

## 2021-08-22 ENCOUNTER — Other Ambulatory Visit (HOSPITAL_COMMUNITY): Payer: Self-pay

## 2021-08-22 ENCOUNTER — Other Ambulatory Visit: Payer: Self-pay | Admitting: Internal Medicine

## 2021-08-22 MED ORDER — CARVEDILOL 3.125 MG PO TABS
3.1250 mg | ORAL_TABLET | Freq: Two times a day (BID) | ORAL | 0 refills | Status: AC
  Filled 2021-08-22: qty 180, 90d supply, fill #0

## 2021-08-22 NOTE — Progress Notes (Signed)
Dr. Ronnald Ramp, ?In light of her small esophageal varices, I would recommend that we change her to carvedilol.  Given her hypertension I would defer this dosing to you. ?Would you mind helping make this transition for her? ?Thanks ?JMP ?

## 2021-08-30 ENCOUNTER — Other Ambulatory Visit (HOSPITAL_COMMUNITY): Payer: Self-pay

## 2021-09-18 ENCOUNTER — Other Ambulatory Visit (HOSPITAL_COMMUNITY): Payer: Self-pay

## 2021-10-04 ENCOUNTER — Telehealth: Payer: Self-pay | Admitting: Internal Medicine

## 2021-10-04 ENCOUNTER — Other Ambulatory Visit: Payer: Self-pay | Admitting: Internal Medicine

## 2021-10-04 ENCOUNTER — Other Ambulatory Visit (HOSPITAL_COMMUNITY): Payer: Self-pay

## 2021-10-04 NOTE — Telephone Encounter (Signed)
Patient called requesting naproxen med refill. Please advise. ?

## 2021-10-07 ENCOUNTER — Other Ambulatory Visit (HOSPITAL_COMMUNITY): Payer: Self-pay

## 2021-10-08 ENCOUNTER — Other Ambulatory Visit (HOSPITAL_COMMUNITY): Payer: Self-pay

## 2021-10-08 NOTE — Telephone Encounter (Signed)
We did not prescribe Naproxen. If patient would like refills, she should contact her primary care physician about this. I have left a message advising patient of this information. ?

## 2021-10-08 NOTE — Telephone Encounter (Signed)
Patient called to request naproxen medication refilled. Per patient, she is all out. Pharmacy has denied it twice. Please advise ?

## 2021-10-08 NOTE — Telephone Encounter (Signed)
I have spoken to patient who states she has no budesonide left. I advised that we sent 1 year worth of refills on 06/26/21 but I will call pharmacy to discuss. Monica with pharmacy indicates that they do have rx and will fill it for patient. ?

## 2021-10-08 NOTE — Telephone Encounter (Signed)
Patient called back and stated she meant to ask for a refill of the budesonide. ?

## 2021-10-28 ENCOUNTER — Other Ambulatory Visit: Payer: Self-pay | Admitting: Internal Medicine

## 2021-10-28 DIAGNOSIS — Z1231 Encounter for screening mammogram for malignant neoplasm of breast: Secondary | ICD-10-CM

## 2021-12-05 ENCOUNTER — Ambulatory Visit
Admission: RE | Admit: 2021-12-05 | Discharge: 2021-12-05 | Disposition: A | Discharge status: Home / Self Care | Payer: 59 | Source: Ambulatory Visit | Attending: Internal Medicine | Admitting: Internal Medicine

## 2021-12-05 DIAGNOSIS — Z1231 Encounter for screening mammogram for malignant neoplasm of breast: Secondary | ICD-10-CM | POA: Diagnosis not present

## 2021-12-07 NOTE — Telephone Encounter (Signed)
Prolia VOB initiated via [email or web address]  Last Prolia inj 07/10/21 Next Prolia inj due 01/08/22

## 2021-12-22 NOTE — Telephone Encounter (Signed)
Prior auth required for [place]  PA PROCESS DETAILS: Prior Authorization is Required. We are unable to confirm if a PA is on file. Please check your records or contact the payer

## 2022-01-02 ENCOUNTER — Telehealth: Payer: Self-pay

## 2022-01-02 DIAGNOSIS — K7460 Unspecified cirrhosis of liver: Secondary | ICD-10-CM

## 2022-01-02 NOTE — Telephone Encounter (Signed)
Returned call to patient. Had to leave another vm.

## 2022-01-02 NOTE — Telephone Encounter (Signed)
-----   Message from Algernon Huxley, RN sent at 07/04/2021 11:03 AM EST ----- Regarding: [place] ABD Needs repeat US in 6 mth

## 2022-01-02 NOTE — Telephone Encounter (Signed)
Patient returned your call.

## 2022-01-02 NOTE — Telephone Encounter (Signed)
Pt returned call. She is aware that she is due for routine [place]. She knows that we have placed the order and she knows to expect a call from radiology scheduling to set up her appt. Pt verbalized understanding and had no concerns at the end of the call.

## 2022-01-02 NOTE — Telephone Encounter (Signed)
Lm on vm for patient to reminder her that she is due for her routine [place]. I told her that we have placed the order and radiology scheduling will contact her within a few days to set up an appt.  [place] order in epic. Secure staff message sent to radiology scheduling to contact pt to set up appt.

## 2022-01-04 NOTE — Telephone Encounter (Signed)
Prior Authorization initiated for PROLIA via [email or web address] KEY: [id number]

## 2022-01-08 ENCOUNTER — Ambulatory Visit: Payer: 59 | Admitting: Internal Medicine

## 2022-01-08 MED ORDER — DENOSUMAB 60 MG/ML ~~LOC~~ SOSY
60.0000 mg | PREFILLED_SYRINGE | Freq: Once | SUBCUTANEOUS | 0 refills | Status: DC
  Filled 2022-01-08: qty 1, 1d supply, fill #0

## 2022-01-08 NOTE — Telephone Encounter (Addendum)
Specialty Pharmacy (Med[employer]act) Key: [facility] - [place] Case ID: [id number] Valid: 01/04/22-01/04/23

## 2022-01-08 NOTE — Addendum Note (Signed)
Addended by: Jasper Loser on: 01/08/2022 07:21 PM   Modules accepted: Orders

## 2022-01-08 NOTE — Telephone Encounter (Signed)
Pt ready for scheduling on or after 01/08/22 Rx sent to [place] cost due at time of visit: $0  Primary: [place] Prolia co-insurance: 0% Admin fee co-insurance: 0%  Secondary: n/a Prolia co-insurance:  Admin fee co-insurance:   Deductible: does not apply  Prior Auth: APPROVED Key: [id number] - [place] Case ID: [id number] Valid: 01/04/22-01/04/23    ** This summary of benefits is an estimation of the patient's out-of-pocket cost. Exact cost may very based on individual plan coverage.

## 2022-01-09 ENCOUNTER — Telehealth: Payer: Self-pay | Admitting: Pharmacist

## 2022-01-09 ENCOUNTER — Other Ambulatory Visit (HOSPITAL_COMMUNITY): Payer: Self-pay

## 2022-01-09 NOTE — Telephone Encounter (Signed)
Called patient to schedule an appointment for the [employer] [facility]. I was unable to reach the patient so I left a HIPAA-compliant message requesting that the patient return my call.   Luke Van Ausdall, PharmD, BCACP, CPP Clinical Pharmacist [facility] [phone]  

## 2022-01-10 ENCOUNTER — Other Ambulatory Visit (HOSPITAL_COMMUNITY): Payer: Self-pay

## 2022-01-14 ENCOUNTER — Other Ambulatory Visit (HOSPITAL_COMMUNITY): Payer: Self-pay

## 2022-01-16 ENCOUNTER — Ambulatory Visit (HOSPITAL_COMMUNITY)
Admission: RE | Admit: 2022-01-16 | Discharge: 2022-01-16 | Disposition: A | Discharge status: Home / Self Care | Payer: 59 | Source: Ambulatory Visit | Attending: Internal Medicine | Admitting: Internal Medicine

## 2022-01-16 DIAGNOSIS — K7460 Unspecified cirrhosis of liver: Secondary | ICD-10-CM | POA: Insufficient documentation

## 2022-01-17 ENCOUNTER — Other Ambulatory Visit: Payer: Self-pay | Admitting: Internal Medicine

## 2022-01-17 ENCOUNTER — Other Ambulatory Visit (HOSPITAL_COMMUNITY): Payer: Self-pay

## 2022-01-17 DIAGNOSIS — I10 Essential (primary) hypertension: Secondary | ICD-10-CM

## 2022-01-20 ENCOUNTER — Other Ambulatory Visit (HOSPITAL_COMMUNITY): Payer: Self-pay

## 2022-01-27 ENCOUNTER — Ambulatory Visit: Payer: 59 | Attending: Internal Medicine | Admitting: Pharmacist

## 2022-01-27 ENCOUNTER — Other Ambulatory Visit (HOSPITAL_COMMUNITY): Payer: Self-pay

## 2022-01-27 DIAGNOSIS — Z79899 Other long term (current) drug therapy: Secondary | ICD-10-CM

## 2022-01-27 MED ORDER — DENOSUMAB 60 MG/ML ~~LOC~~ SOSY
60.0000 mg | PREFILLED_SYRINGE | Freq: Once | SUBCUTANEOUS | 0 refills | Status: AC
  Filled 2022-01-27: qty 1, 180d supply, fill #0

## 2022-01-27 NOTE — Progress Notes (Signed)
S:  Patient presents for review of their specialty medication therapy.  Patient is currently taking Prolia for osteoporosis. Patient is managed by Dr. Ronnald Ramp for this.   Adherence: confirms  Efficacy: reports that it works well for her   Dosing: 60 mg q7month  Dose adjustments: Renal: Monitor patients with severe impairment (CrCl <30 mL/minute or on dialysis) closely, as significant and prolonged hypocalcemia (incidence of 29% and potentially lasting weeks to months) and marked elevations of serum parathyroid hormone are serious risks in this population. Ensure adequate calcium and vitamin D intake/supplementation. CrCl ?30 mL/minute: No dosage adjustment necessary. CrCl <30 mL/minute: No dosage adjustment necessary; use in conjunction with guidance from patient's nephrology team. Hepatic: no dose adjustments (has not been studied)  Drug-drug interactions: none identified   Screening: TB test: completed, negative (06/21/2019) Hepatitis: completed 05/03/2019  Monitoring: S/sx of infection: denies  S/sx of hypersensitivity: denies  S/sx of hypocalcemia/hypercalcemia: denies  Dermatitis/skin rash: denies  Peripheral edema: denies  HA: denies  GI upset: denies   Other side effects: denies    Last bone density study: no recent one on file   O:   Lab Results  Component Value Date   WBC 6.0 06/26/2021   HGB 13.1 06/26/2021   HCT 40.3 06/26/2021   MCV 98.7 06/26/2021   PLT 200.0 06/26/2021      Chemistry      Component Value Date/Time   NA 138 06/26/2021 1423   K 4.0 06/26/2021 1423   CL 103 06/26/2021 1423   CO2 25 06/26/2021 1423   BUN 16 06/26/2021 1423   CREATININE 0.49 06/26/2021 1423      Component Value Date/Time   CALCIUM 10.1 06/26/2021 1423   CALCIUM 10.2 05/05/2011 1417   ALKPHOS 80 06/26/2021 1423   ALKPHOS 80 06/26/2021 1423   AST 27 06/26/2021 1423   AST 27 06/26/2021 1423   ALT 22 06/26/2021 1423   ALT 22 06/26/2021 1423   BILITOT 1.2 06/26/2021  1423   BILITOT 1.2 06/26/2021 1423       A/P: 1. Medication review: Patient currently on Prolia for osteoporosis. Reviewed the medication with the patient, including the following: Prolia (denosumab) is a monoclonal antibody with affinity for nuclear factor-kappa ligand (RANKL). Prolia binds to RANKL and prevents osteoclast formation, leading to decreased bone resorption and increased bone mass in osteoporosis. Patient educated on purpose, proper use, and potential adverse effects of Prolia. The most common adverse effects are hypersensitivities, peripheral edema, dermatitis/skin rash, GI upset, HA, joint pain, and infection. There is the possibility of atypical femur fracture, serum calcium disturbances, and osteonecrosis of the jaw. Patients should monitor for and report hip, thigh, or groin pain. Additionally, patients should monitor for and report jaw pain, tooth/periodontal infection, toothache, and/or gingival ulceration/erosion. Prolia exists as a solution prefilled syringe for SQ administration. Administration: Denosumab is intended for SubQ route only and should not be administered IV, IM, or intradermally. Prior to administration, bring to room temperature in original container (allow to stand ~15 to 30 minutes); do not warm by any other method. Solution may contain trace amounts of translucent to white protein particles; do not use if cloudy, discolored (normal solution should be clear and colorless to pale yellow), or contains excessive particles or foreign matter. Avoid vigorous shaking. Administer via SubQ injection in the upper arm, upper thigh, or abdomen; should only be administered by a health care professional. No recommendations for any changes at this time.   LBenard Halsted PharmD, BCACP,  [place] [phone]

## 2022-01-29 ENCOUNTER — Other Ambulatory Visit (HOSPITAL_COMMUNITY): Payer: Self-pay

## 2022-02-13 ENCOUNTER — Other Ambulatory Visit (HOSPITAL_COMMUNITY): Payer: Self-pay

## 2022-02-24 ENCOUNTER — Ambulatory Visit: Payer: 59 | Admitting: Internal Medicine

## 2022-02-27 ENCOUNTER — Other Ambulatory Visit (HOSPITAL_COMMUNITY): Payer: Self-pay

## 2022-03-13 ENCOUNTER — Other Ambulatory Visit (HOSPITAL_COMMUNITY): Payer: Self-pay

## 2022-03-31 ENCOUNTER — Other Ambulatory Visit (HOSPITAL_COMMUNITY): Payer: Self-pay

## 2022-04-17 DIAGNOSIS — W19XXXA Unspecified fall, initial encounter: Secondary | ICD-10-CM | POA: Diagnosis not present

## 2022-04-17 DIAGNOSIS — R001 Bradycardia, unspecified: Secondary | ICD-10-CM | POA: Diagnosis not present

## 2022-04-17 DIAGNOSIS — I959 Hypotension, unspecified: Secondary | ICD-10-CM | POA: Diagnosis not present

## 2022-04-17 DIAGNOSIS — I10 Essential (primary) hypertension: Secondary | ICD-10-CM | POA: Diagnosis not present

## 2022-04-17 DIAGNOSIS — F10129 Alcohol abuse with intoxication, unspecified: Secondary | ICD-10-CM | POA: Diagnosis not present

## 2022-04-18 ENCOUNTER — Encounter (HOSPITAL_COMMUNITY): Payer: Self-pay

## 2022-04-18 ENCOUNTER — Emergency Department (HOSPITAL_COMMUNITY): Payer: 59

## 2022-04-18 ENCOUNTER — Other Ambulatory Visit: Payer: Self-pay

## 2022-04-18 ENCOUNTER — Emergency Department (HOSPITAL_COMMUNITY)
Admission: EM | Admit: 2022-04-18 | Discharge: 2022-04-18 | Disposition: A | Discharge status: Home / Self Care | Payer: 59 | Attending: Emergency Medicine | Admitting: Emergency Medicine

## 2022-04-18 DIAGNOSIS — Z79899 Other long term (current) drug therapy: Secondary | ICD-10-CM | POA: Insufficient documentation

## 2022-04-18 DIAGNOSIS — I10 Essential (primary) hypertension: Secondary | ICD-10-CM | POA: Diagnosis not present

## 2022-04-18 DIAGNOSIS — M47812 Spondylosis without myelopathy or radiculopathy, cervical region: Secondary | ICD-10-CM | POA: Diagnosis not present

## 2022-04-18 DIAGNOSIS — M19011 Primary osteoarthritis, right shoulder: Secondary | ICD-10-CM | POA: Diagnosis not present

## 2022-04-18 DIAGNOSIS — M25511 Pain in right shoulder: Secondary | ICD-10-CM | POA: Diagnosis present

## 2022-04-18 DIAGNOSIS — I700 Atherosclerosis of aorta: Secondary | ICD-10-CM | POA: Diagnosis not present

## 2022-04-18 DIAGNOSIS — M25561 Pain in right knee: Secondary | ICD-10-CM | POA: Diagnosis not present

## 2022-04-18 DIAGNOSIS — Y92002 Bathroom of unspecified non-institutional (private) residence single-family (private) house as the place of occurrence of the external cause: Secondary | ICD-10-CM | POA: Insufficient documentation

## 2022-04-18 DIAGNOSIS — F10120 Alcohol abuse with intoxication, uncomplicated: Secondary | ICD-10-CM | POA: Diagnosis not present

## 2022-04-18 DIAGNOSIS — W1830XA Fall on same level, unspecified, initial encounter: Secondary | ICD-10-CM | POA: Diagnosis not present

## 2022-04-18 DIAGNOSIS — S40211A Abrasion of right shoulder, initial encounter: Secondary | ICD-10-CM | POA: Insufficient documentation

## 2022-04-18 DIAGNOSIS — Z043 Encounter for examination and observation following other accident: Secondary | ICD-10-CM | POA: Diagnosis not present

## 2022-04-18 DIAGNOSIS — F10920 Alcohol use, unspecified with intoxication, uncomplicated: Secondary | ICD-10-CM

## 2022-04-18 DIAGNOSIS — Z7982 Long term (current) use of aspirin: Secondary | ICD-10-CM | POA: Insufficient documentation

## 2022-04-18 DIAGNOSIS — F10129 Alcohol abuse with intoxication, unspecified: Secondary | ICD-10-CM | POA: Diagnosis not present

## 2022-04-18 DIAGNOSIS — M25761 Osteophyte, right knee: Secondary | ICD-10-CM | POA: Diagnosis not present

## 2022-04-18 LAB — BASIC METABOLIC PANEL
Anion gap: 14 (ref 5–15)
BUN: 13 mg/dL (ref 8–23)
CO2: 16 mmol/L — ABNORMAL LOW (ref 22–32)
Calcium: 9 mg/dL (ref 8.9–10.3)
Chloride: 102 mmol/L (ref 98–111)
Creatinine, Ser: 0.41 mg/dL — ABNORMAL LOW (ref 0.44–1.00)
GFR, Estimated: >60 mL/min (ref >60)
Glucose, Bld: 111 mg/dL — ABNORMAL HIGH (ref 70–99)
Potassium: 3.5 mmol/L (ref 3.5–5.1)
Sodium: 132 mmol/L — ABNORMAL LOW (ref 135–145)

## 2022-04-18 LAB — CBC WITH DIFFERENTIAL/PLATELET
Abs Immature Granulocytes: 0.03 10*3/uL (ref 0.00–0.07)
Basophils Absolute: 0.1 10*3/uL (ref 0.0–0.1)
Basophils Relative: 1 %
Eosinophils Absolute: 0.1 10*3/uL (ref 0.0–0.5)
Eosinophils Relative: 2 %
HCT: 36.9 % (ref 36.0–46.0)
Hemoglobin: 12.8 g/dL (ref 12.0–15.0)
Immature Granulocytes: 1 %
Lymphocytes Relative: 32 %
Lymphs Abs: 1.9 10*3/uL (ref 0.7–4.0)
MCH: 33.7 pg (ref 26.0–34.0)
MCHC: 34.7 g/dL (ref 30.0–36.0)
MCV: 97.1 fL (ref 80.0–100.0)
Monocytes Absolute: 0.4 10*3/uL (ref 0.1–1.0)
Monocytes Relative: 6 %
Neutro Abs: 3.5 10*3/uL (ref 1.7–7.7)
Neutrophils Relative %: 58 %
Platelets: 178 10*3/uL (ref 150–400)
RBC: 3.8 MIL/uL — ABNORMAL LOW (ref 3.87–5.11)
RDW: 12 % (ref 11.5–15.5)
WBC: 5.9 10*3/uL (ref 4.0–10.5)
nRBC: 0 % (ref 0.0–0.2)

## 2022-04-18 LAB — ETHANOL: Alcohol, Ethyl (B): 237 mg/dL — ABNORMAL HIGH (ref <10)

## 2022-04-18 NOTE — Discharge Instructions (Signed)
You were seen today for frequent falls.  You were found to be intoxicated.  This puts you at increased risk for falling and injuring yourself specially given your age.

## 2022-04-18 NOTE — ED Triage Notes (Signed)
Pt BIB EMS from home. Family reports pt with increased falls recently. Pt had fall tonight - denies LOC or hitting head. Pt reports she does not feel good.  ETOH on board  Pt has hx of cirrhosis and HTN - not compliant with meds  VS with EMS  206/98 HR 66 96% RA  CBG 132

## 2022-04-18 NOTE — ED Provider Notes (Signed)
[facility] EMERGENCY DEPARTMENT Provider Note   CSN: [id number] Arrival date & time: 04/18/22  0001     History {Add pertinent medical, surgical, social history, OB history to HPI:1} Chief Complaint  Patient presents with   Fall   Alcohol Intoxication    Kimberly Pierce is a 66 y.o. female.  HPI     This is a 38 female with a history of hypertension, DVT, cirrhosis who presents following a fall.  Patient reports she fell going into the bathroom.  She remembers falling.  She is unsure how she fell but denies syncope.  Family reports increasing falls at home.  She denies loss of consciousness.  She is not on blood thinners.  She reports right shoulder pain.  She reports right knee pain.  Does report drinking alcohol tonight.  States that she drank 2 beers.  Denies chest pain, shortness of breath, abdominal pain, nausea, vomiting.  Home Medications Prior to Admission medications   Medication Sig Start Date End Date Taking? Authorizing Provider  acetaminophen (TYLENOL) 500 MG tablet Take 1,000 mg by mouth every 6 (six) hours as needed for moderate pain or mild pain.    [provider]  aspirin EC 81 MG tablet Take 81 mg by mouth daily.    [provider]  budesonide (ENTOCORT EC) 3 MG 24 hr capsule Take 1 capsule (3 mg total) by mouth daily. 06/26/21   Pyrtle, Lajuan Lines, MD  carvedilol (COREG) 3.125 MG tablet Take 1 tablet by mouth 2 times daily with a meal. 08/22/21   Janith Lima, MD  chlorthalidone (HYGROTON) 25 MG tablet Take 1 tablet (25 mg total) by mouth daily. 06/26/21   Pyrtle, Lajuan Lines, MD  Cholecalciferol 25 MCG (1000 UT) tablet Take 1,000 Units by mouth daily.    [provider]  gabapentin (NEURONTIN) 100 MG capsule Take 1-3 capsules (100-300 mg total) by mouth 3 (three) times daily as needed (nerve pain). 02/29/20   Gregor Hams, MD  naproxen (NAPROSYN) 375 MG tablet Take 1 tablet (375 mg total) by mouth 2 (two) times daily. 04/24/21    Elainah Rhyne, Alvin Critchley, DO  omeprazole (PRILOSEC) 40 MG capsule Take 1 capsule  by mouth daily. 08/15/21   Mansouraty, Telford Nab., MD  polyethylene glycol (MIRALAX / GLYCOLAX) 17 g packet Take 17 g by mouth daily as needed for mild constipation or moderate constipation (also available OTC). 06/26/21   Pyrtle, Lajuan Lines, MD  tetrahydrozoline 0.05 % ophthalmic solution Place 1 drop into both eyes 4 (four) times daily as needed (dry eyes, itchy eyes).     [provider]      Allergies    Imuran [azathioprine] and Percocet [oxycodone-acetaminophen]    Review of Systems   Review of Systems  Constitutional:  Negative for fever.  Musculoskeletal:        Right knee pain, right shoulder pain  All other systems reviewed and are negative.   Physical Exam Updated Vital Signs BP (!) 164/90   Pulse 70   Temp 97.8 F (36.6 C) (Oral)   Resp 12   Ht 1.676 m (5' 6" )   Wt 97.5 kg   SpO2 99%   BMI 34.70 kg/m  Physical Exam Vitals and nursing note reviewed.  Constitutional:      Appearance: She is well-developed.     Comments: ABCs intact  HENT:     Head: Normocephalic and atraumatic.     Mouth/Throat:     Mouth: Mucous  membranes are moist.     Comments: Poor dentition Eyes:     Pupils: Pupils are equal, round, and reactive to light.  Cardiovascular:     Rate and Rhythm: Normal rate and regular rhythm.     Heart sounds: Normal heart sounds.  Pulmonary:     Effort: Pulmonary effort is normal. No respiratory distress.  Abdominal:     Palpations: Abdomen is soft.  Musculoskeletal:     Cervical back: Neck supple.     Comments: Abrasion with swelling to the right shoulder, normal range of motion, tenderness to palpation, neurovascular intact distally  Skin:    General: Skin is warm and dry.  Neurological:     Mental Status: She is alert and oriented to person, place, and time.     Comments: Appears intoxicated  Psychiatric:        Mood and Affect: Mood normal.     ED Results /  Procedures / Treatments   Labs (all labs ordered are listed, but only abnormal results are displayed) Labs Reviewed  CBC WITH DIFFERENTIAL/PLATELET  BASIC METABOLIC PANEL  ETHANOL    EKG None  Radiology No results found.  Procedures Procedures  {Document cardiac monitor, telemetry assessment procedure when appropriate:1}  Medications Ordered in ED Medications - No data to display  ED Course/ Medical Decision Making/ A&P                           Medical Decision Making Amount and/or Complexity of Data Reviewed Labs: ordered. Radiology: ordered.   ***  {Document critical care time when appropriate:1} {Document review of labs and clinical decision tools ie heart score, Chads2Vasc2 etc:1}  {Document your independent review of radiology images, and any outside records:1} {Document your discussion with family members, caretakers, and with consultants:1} {Document social determinants of health affecting pt's care:1} {Document your decision making why or why not admission, treatments were needed:1} Final Clinical Impression(s) / ED Diagnoses Final diagnoses:  None    Rx / DC Orders ED Discharge Orders     None

## 2022-04-18 NOTE — ED Notes (Signed)
Pt given snack at this time  

## 2022-05-22 NOTE — Telephone Encounter (Signed)
Forwarding to [employer] Prior [employer]

## 2022-06-08 DIAGNOSIS — R69 Illness, unspecified: Secondary | ICD-10-CM | POA: Diagnosis not present

## 2022-06-08 DIAGNOSIS — F10929 Alcohol use, unspecified with intoxication, unspecified: Secondary | ICD-10-CM | POA: Diagnosis not present

## 2022-06-08 DIAGNOSIS — R059 Cough, unspecified: Secondary | ICD-10-CM | POA: Diagnosis not present

## 2022-06-08 DIAGNOSIS — I10 Essential (primary) hypertension: Secondary | ICD-10-CM | POA: Diagnosis not present

## 2022-06-09 ENCOUNTER — Other Ambulatory Visit: Payer: Self-pay

## 2022-06-09 ENCOUNTER — Emergency Department (HOSPITAL_COMMUNITY)
Admission: EM | Admit: 2022-06-09 | Discharge: 2022-06-09 | Discharge status: Against Medical Advice | Payer: Commercial Managed Care - PPO | Attending: Emergency Medicine | Admitting: Emergency Medicine

## 2022-06-09 DIAGNOSIS — Y92002 Bathroom of unspecified non-institutional (private) residence single-family (private) house as the place of occurrence of the external cause: Secondary | ICD-10-CM | POA: Insufficient documentation

## 2022-06-09 DIAGNOSIS — Z5321 Procedure and treatment not carried out due to patient leaving prior to being seen by health care provider: Secondary | ICD-10-CM | POA: Diagnosis not present

## 2022-06-09 DIAGNOSIS — W19XXXA Unspecified fall, initial encounter: Secondary | ICD-10-CM | POA: Insufficient documentation

## 2022-06-09 DIAGNOSIS — M79605 Pain in left leg: Secondary | ICD-10-CM | POA: Diagnosis not present

## 2022-06-09 DIAGNOSIS — R69 Illness, unspecified: Secondary | ICD-10-CM | POA: Diagnosis not present

## 2022-06-09 DIAGNOSIS — J00 Acute nasopharyngitis [common cold]: Secondary | ICD-10-CM | POA: Insufficient documentation

## 2022-06-09 DIAGNOSIS — M79604 Pain in right leg: Secondary | ICD-10-CM | POA: Insufficient documentation

## 2022-06-09 DIAGNOSIS — R059 Cough, unspecified: Secondary | ICD-10-CM | POA: Diagnosis not present

## 2022-06-09 NOTE — ED Notes (Signed)
Pt stated she wanted to go home. Pt seen getting into [facility] and leaving the ED.

## 2022-06-09 NOTE — ED Provider Triage Note (Signed)
  Emergency Medicine Provider Triage Evaluation Note  MRN:  [id number]  Arrival date & time: 06/09/22    Medically screening exam initiated at 1:06 AM.   CC:   Fall   HPI:  Kimberly Pierce is a 67 y.o. year-old female presents to the ED with chief complaint of feeling cold and feeling sick. Seems intoxicated.Marland Kitchen  History provided by patient. ROS:  -As included in HPI PE:   Vitals:   06/09/22 0016  BP: (!) 148/84  Pulse: 65  Resp: 18  Temp: 97.7 F (36.5 C)  SpO2: 99%    Non-toxic appearing No respiratory distress  MDM:  Based on signs and symptoms, intoxication is highest on my differential. I've ordered labs in triage to expedite lab/diagnostic workup.  Patient was informed that the remainder of the evaluation will be completed by another provider, this initial triage assessment does not replace that evaluation, and the importance of remaining in the ED until their evaluation is complete.    Montine Circle, PA-C 06/09/22 9024

## 2022-06-09 NOTE — ED Triage Notes (Signed)
Patient arrived with EMS from home at at home inside bathroom this evening , no LOC , CBG= 112, reports pain at bilateral legs .

## 2022-06-26 ENCOUNTER — Other Ambulatory Visit (HOSPITAL_COMMUNITY): Payer: Self-pay

## 2022-07-23 ENCOUNTER — Other Ambulatory Visit: Payer: Self-pay

## 2022-07-23 DIAGNOSIS — K7460 Unspecified cirrhosis of liver: Secondary | ICD-10-CM

## 2022-07-28 ENCOUNTER — Other Ambulatory Visit (HOSPITAL_COMMUNITY): Payer: Self-pay

## 2022-08-01 ENCOUNTER — Other Ambulatory Visit (HOSPITAL_COMMUNITY): Payer: Self-pay

## 2022-08-07 ENCOUNTER — Other Ambulatory Visit (HOSPITAL_COMMUNITY): Payer: Self-pay

## 2022-08-08 ENCOUNTER — Other Ambulatory Visit (HOSPITAL_COMMUNITY): Payer: Self-pay

## 2022-08-20 ENCOUNTER — Other Ambulatory Visit (HOSPITAL_COMMUNITY): Payer: Self-pay

## 2022-08-28 ENCOUNTER — Other Ambulatory Visit (HOSPITAL_COMMUNITY): Payer: Self-pay

## 2022-09-03 ENCOUNTER — Other Ambulatory Visit (HOSPITAL_COMMUNITY): Payer: Self-pay

## 2022-09-04 ENCOUNTER — Other Ambulatory Visit (HOSPITAL_COMMUNITY): Payer: Self-pay

## 2022-09-12 ENCOUNTER — Other Ambulatory Visit (HOSPITAL_COMMUNITY): Payer: Self-pay

## 2022-10-27 ENCOUNTER — Emergency Department (HOSPITAL_COMMUNITY): Payer: Medicare HMO

## 2022-10-27 ENCOUNTER — Other Ambulatory Visit (HOSPITAL_COMMUNITY): Payer: Medicare HMO

## 2022-10-27 ENCOUNTER — Emergency Department (HOSPITAL_COMMUNITY)
Admission: EM | Admit: 2022-10-27 | Discharge: 2022-10-27 | Disposition: A | Discharge status: Home / Self Care | Payer: Medicare HMO | Attending: Emergency Medicine | Admitting: Emergency Medicine

## 2022-10-27 DIAGNOSIS — Y907 Blood alcohol level of 200-239 mg/100 ml: Secondary | ICD-10-CM | POA: Diagnosis not present

## 2022-10-27 DIAGNOSIS — I6782 Cerebral ischemia: Secondary | ICD-10-CM | POA: Insufficient documentation

## 2022-10-27 DIAGNOSIS — S50312A Abrasion of left elbow, initial encounter: Secondary | ICD-10-CM | POA: Insufficient documentation

## 2022-10-27 DIAGNOSIS — W19XXXA Unspecified fall, initial encounter: Secondary | ICD-10-CM | POA: Insufficient documentation

## 2022-10-27 DIAGNOSIS — F10920 Alcohol use, unspecified with intoxication, uncomplicated: Secondary | ICD-10-CM | POA: Diagnosis not present

## 2022-10-27 DIAGNOSIS — G3189 Other specified degenerative diseases of nervous system: Secondary | ICD-10-CM | POA: Insufficient documentation

## 2022-10-27 LAB — COMPREHENSIVE METABOLIC PANEL
ALT: 19 U/L (ref 0–44)
AST: 24 U/L (ref 15–41)
Albumin: 3.7 g/dL (ref 3.5–5.0)
Alkaline Phosphatase: 76 U/L (ref 38–126)
Anion gap: 14 (ref 5–15)
BUN: 14 mg/dL (ref 8–23)
CO2: 17 mmol/L — ABNORMAL LOW (ref 22–32)
Calcium: 8.8 mg/dL — ABNORMAL LOW (ref 8.9–10.3)
Chloride: 96 mmol/L — ABNORMAL LOW (ref 98–111)
Creatinine, Ser: 0.63 mg/dL (ref 0.44–1.00)
GFR, Estimated: >60 mL/min (ref >60)
Glucose, Bld: 113 mg/dL — ABNORMAL HIGH (ref 70–99)
Potassium: 3.5 mmol/L (ref 3.5–5.1)
Sodium: 127 mmol/L — ABNORMAL LOW (ref 135–145)
Total Bilirubin: 0.8 mg/dL (ref 0.3–1.2)
Total Protein: 7.6 g/dL (ref 6.5–8.1)

## 2022-10-27 LAB — CBC
HCT: 39 % (ref 36.0–46.0)
Hemoglobin: 12.8 g/dL (ref 12.0–15.0)
MCH: 31 pg (ref 26.0–34.0)
MCHC: 32.8 g/dL (ref 30.0–36.0)
MCV: 94.4 fL (ref 80.0–100.0)
Platelets: 206 10*3/uL (ref 150–400)
RBC: 4.13 MIL/uL (ref 3.87–5.11)
RDW: 11.9 % (ref 11.5–15.5)
WBC: 6.1 10*3/uL (ref 4.0–10.5)
nRBC: 0 % (ref 0.0–0.2)

## 2022-10-27 LAB — ETHANOL: Alcohol, Ethyl (B): 235 mg/dL — ABNORMAL HIGH (ref <10)

## 2022-10-27 LAB — I-STAT CHEM 8, ED
BUN: 14 mg/dL (ref 8–23)
Calcium, Ion: 1.14 mmol/L — ABNORMAL LOW (ref 1.15–1.40)
Chloride: 106 mmol/L (ref 98–111)
Creatinine, Ser: 0.6 mg/dL (ref 0.44–1.00)
Glucose, Bld: 106 mg/dL — ABNORMAL HIGH (ref 70–99)
HCT: 39 % (ref 36.0–46.0)
Hemoglobin: 13.3 g/dL (ref 12.0–15.0)
Potassium: 3.9 mmol/L (ref 3.5–5.1)
Sodium: 137 mmol/L (ref 135–145)
TCO2: 18 mmol/L — ABNORMAL LOW (ref 22–32)

## 2022-10-27 MED ORDER — SODIUM CHLORIDE 0.9 % IV BOLUS
1000.0000 mL | Freq: Once | INTRAVENOUS | Status: AC
  Administered 2022-10-27: 1000 mL via INTRAVENOUS

## 2022-10-27 NOTE — ED Triage Notes (Signed)
Pt BIB [facility] from home with c/o small laceration to left elbow and left hand after fall. Pt fell from bed reaching for something and was unable to get up. ETOH+. Significant amount of blood on scene per EMS. Pt not on blood thinners.

## 2022-10-27 NOTE — Discharge Instructions (Signed)
Please follow-up with the orthopedic group listed so that the can evaluate your hands.

## 2022-10-27 NOTE — ED Provider Notes (Signed)
[facility] Emergency Department Provider Note MRN:  [id number]  Arrival date & time: 10/27/22     Chief Complaint   Hand Injury and Fall   History of Present Illness   Kimberly Pierce is a 67 y.o. year-old female presents to the ED with chief complaint of fall.  She is BIB EMS after fall while intoxicated.  She is not anticoagulated.  She denies hitting her head.  She states that she was reaching for her robe when she lost her balance and fell over.  She sustained a small scrape to the left hand and left elbow.  She denies any other injuries.  History provided by patient.   Review of Systems  Pertinent positive and negative review of systems noted in HPI.    Physical Exam   Vitals:   10/27/22 0345 10/27/22 0445  BP: 126/75 138/69  Pulse: 61   Resp: 18   Temp:  98.3 F (36.8 C)  SpO2: 99%     CONSTITUTIONAL:  intoxicated-appearing, NAD NEURO:  Alert and oriented x 3, CN 3-12 grossly intact EYES:  eyes equal and reactive ENT/NECK:  Supple, no stridor  CARDIO:  normal rate, regular rhythm, appears well-perfused  PULM:  No respiratory distress,  GI/GU:  non-distended,  MSK/SPINE:  No gross deformities, no edema, moves all extremities  SKIN:  no rash, abrasion to left elbow   *Additional and/or pertinent findings included in MDM below  Diagnostic and Interventional Summary    EKG Interpretation  Date/Time:    Ventricular Rate:    PR Interval:    QRS Duration:   QT Interval:    QTC Calculation:   R Axis:     Text Interpretation:         Labs Reviewed  COMPREHENSIVE METABOLIC PANEL - Abnormal; Notable for the following components:      Result Value   Sodium 127 (*)    Chloride 96 (*)    CO2 17 (*)    Glucose, Bld 113 (*)    Calcium 8.8 (*)    All other components within normal limits  ETHANOL - Abnormal; Notable for the following components:   Alcohol, Ethyl (B) 235 (*)    All other components within normal limits  I-STAT CHEM  8, ED - Abnormal; Notable for the following components:   Glucose, Bld 106 (*)    Calcium, Ion 1.14 (*)    TCO2 18 (*)    All other components within normal limits  CBC    CT HEAD WO CONTRAST ( )  Final Result    CT Cervical Spine Wo Contrast  Final Result    DG Chest Port 1 View  Final Result    DG Pelvis Portable  Final Result    DG Hand Complete Left  Final Result      Medications  sodium chloride 0.9 % bolus 1,000 mL (0 mLs Intravenous Stopped 10/27/22 0540)     Procedures  /  Critical Care Procedures  ED Course and Medical Decision Making  I have reviewed the triage vital signs, the nursing notes, and pertinent available records from the EMR.  Social Determinants Affecting Complexity of Care: Patient has no clinically significant social determinants affecting this chief complaint..   ED Course:    Medical Decision Making Patient here with fall while at home.  She is intoxicated and says she tipped over while reaching for her robe.  She didn't hit her head.  She's not anticoagulated.  CT head and  neck are without obvious fracture.  I have a low degree of suspicion for unseen trauma.  There isn't any trauma on my physical exam.  She states that she has been having some trouble with her hands for the past 5 months since she retired.  She says her fingers sometimes swell and are sometimes hard to move.  This doesn't seem to be an acute problem.  I'll refer her to ortho/hand for eval.  Amount and/or Complexity of Data Reviewed Labs: ordered.    Details: Sodium low initially at 127, normalized after a liter of NS Ethanol is 235 Radiology: ordered and independent interpretation performed.    Details: No intracranial bleeding     Consultants: No consultations were needed in caring for this patient.   Treatment and Plan: Emergency department workup does not suggest an emergent condition requiring admission or immediate intervention beyond  what has been  performed at this time. The patient is safe for discharge and has  been instructed to return immediately for worsening symptoms, change in  symptoms or any other concerns    Final Clinical Impressions(s) / ED Diagnoses     ICD-10-CM   1. Fall, initial encounter  W19.XXXA     2. Alcoholic intoxication without complication (HCC)  Z61.096       ED Discharge Orders     None         Discharge Instructions Discussed with and Provided to Patient:    Discharge Instructions      Please follow-up with the orthopedic group listed so that the can evaluate your hands.      Roxy Horseman, PA-C 10/27/22 0454    Gilda Crease, MD 10/27/22 [phone]

## 2023-02-28 ENCOUNTER — Encounter (HOSPITAL_COMMUNITY): Payer: Self-pay

## 2023-05-12 ENCOUNTER — Telehealth: Payer: Self-pay

## 2023-05-12 NOTE — Telephone Encounter (Signed)
Prolia arrived from pharmacy and placed in fridge.

## 2024-01-08 ENCOUNTER — Telehealth: Payer: Self-pay

## 2024-01-08 NOTE — Telephone Encounter (Signed)
  Dear Kimberly Pierce,  Our office recently received notification form [employer], the company that conducts Cologuard stool testing, that you may be potentially due for colon cancer rescreening.  We are providing this notification to you as a courtesy.  As there are a number of factors used to determine whether an individual is an appropriate candidate for any of the several recognized colon cancer screening strategies, we encourage you to discuss this issue with your primary care physician.  Alternatively, we welcome you to schedule an office appointment with your gastroenterologist for further discussion.       Sincerely,   [facility]

## 2024-02-23 DIAGNOSIS — H524 Presbyopia: Secondary | ICD-10-CM | POA: Diagnosis not present

## 2024-02-23 DIAGNOSIS — H52209 Unspecified astigmatism, unspecified eye: Secondary | ICD-10-CM | POA: Diagnosis not present

## 2024-02-23 DIAGNOSIS — H5213 Myopia, bilateral: Secondary | ICD-10-CM | POA: Diagnosis not present

## 2024-03-02 ENCOUNTER — Encounter: Payer: Self-pay | Admitting: Internal Medicine

## 2024-04-18 ENCOUNTER — Other Ambulatory Visit: Payer: Self-pay | Admitting: Family Medicine

## 2024-04-18 DIAGNOSIS — Z1231 Encounter for screening mammogram for malignant neoplasm of breast: Secondary | ICD-10-CM

## 2024-08-08 ENCOUNTER — Ambulatory Visit
